# Patient Record
Sex: Male | Born: 1965 | Race: White | Hispanic: No | Marital: Married | Smoking: Never smoker
Health system: Southern US, Community
[De-identification: ages and names within clinical notes are randomized; demographics above are authoritative.]

## PROBLEM LIST (undated history)

## (undated) DIAGNOSIS — I10 Essential (primary) hypertension: Secondary | ICD-10-CM

## (undated) DIAGNOSIS — M1990 Unspecified osteoarthritis, unspecified site: Secondary | ICD-10-CM

## (undated) DIAGNOSIS — K5790 Diverticulosis of intestine, part unspecified, without perforation or abscess without bleeding: Secondary | ICD-10-CM

## (undated) DIAGNOSIS — K625 Hemorrhage of anus and rectum: Secondary | ICD-10-CM

## (undated) DIAGNOSIS — M459 Ankylosing spondylitis of unspecified sites in spine: Secondary | ICD-10-CM

## (undated) DIAGNOSIS — E669 Obesity, unspecified: Secondary | ICD-10-CM

## (undated) DIAGNOSIS — K529 Noninfective gastroenteritis and colitis, unspecified: Secondary | ICD-10-CM

## (undated) HISTORY — PX: COLON SURGERY: SHX602

## (undated) HISTORY — DX: Obesity, unspecified: E66.9

## (undated) HISTORY — PX: COLOSTOMY CLOSURE: SHX1381

## (undated) HISTORY — DX: Ankylosing spondylitis of unspecified sites in spine: M45.9

## (undated) HISTORY — PX: NECK SURGERY: SHX720

## (undated) HISTORY — DX: Hemorrhage of anus and rectum: K62.5

## (undated) HISTORY — PX: APPENDECTOMY: SHX54

## (undated) HISTORY — PX: TONSILLECTOMY: SUR1361

## (undated) HISTORY — DX: Unspecified osteoarthritis, unspecified site: M19.90

## (undated) HISTORY — DX: Noninfective gastroenteritis and colitis, unspecified: K52.9

## (undated) HISTORY — DX: Essential (primary) hypertension: I10

## (undated) HISTORY — PX: SPINE SURGERY: SHX786

## (undated) HISTORY — DX: Diverticulosis of intestine, part unspecified, without perforation or abscess without bleeding: K57.90

---

## 1999-06-28 ENCOUNTER — Encounter: Admission: RE | Admit: 1999-06-28 | Discharge: 1999-06-28 | Payer: Self-pay | Admitting: Orthopedic Surgery

## 1999-07-01 ENCOUNTER — Encounter: Admission: RE | Admit: 1999-07-01 | Discharge: 1999-07-01 | Payer: Self-pay | Admitting: Orthopedic Surgery

## 1999-07-01 ENCOUNTER — Encounter: Payer: Self-pay | Admitting: Orthopedic Surgery

## 2001-01-14 ENCOUNTER — Inpatient Hospital Stay (HOSPITAL_COMMUNITY): Admission: AD | Admit: 2001-01-14 | Discharge: 2001-01-17 | Payer: Self-pay | Admitting: Gastroenterology

## 2001-01-14 ENCOUNTER — Encounter (INDEPENDENT_AMBULATORY_CARE_PROVIDER_SITE_OTHER): Payer: Self-pay | Admitting: *Deleted

## 2002-04-10 ENCOUNTER — Encounter (INDEPENDENT_AMBULATORY_CARE_PROVIDER_SITE_OTHER): Payer: Self-pay | Admitting: Specialist

## 2002-04-10 ENCOUNTER — Inpatient Hospital Stay (HOSPITAL_COMMUNITY): Admission: EM | Admit: 2002-04-10 | Discharge: 2002-04-13 | Payer: Self-pay | Admitting: Emergency Medicine

## 2004-11-17 ENCOUNTER — Encounter: Admission: RE | Admit: 2004-11-17 | Discharge: 2005-02-15 | Payer: Self-pay | Admitting: *Deleted

## 2005-01-01 ENCOUNTER — Observation Stay (HOSPITAL_COMMUNITY): Admission: AD | Admit: 2005-01-01 | Discharge: 2005-01-02 | Payer: Self-pay | Admitting: Internal Medicine

## 2006-07-13 ENCOUNTER — Encounter: Admission: RE | Admit: 2006-07-13 | Discharge: 2006-07-13 | Payer: Self-pay | Admitting: Rheumatology

## 2006-08-23 ENCOUNTER — Ambulatory Visit (HOSPITAL_COMMUNITY): Admission: RE | Admit: 2006-08-23 | Discharge: 2006-08-23 | Payer: Self-pay | Admitting: Internal Medicine

## 2006-08-23 ENCOUNTER — Encounter (HOSPITAL_COMMUNITY): Admission: RE | Admit: 2006-08-23 | Discharge: 2006-11-04 | Payer: Self-pay | Admitting: Internal Medicine

## 2008-01-30 HISTORY — PX: NECK SURGERY: SHX720

## 2009-01-26 ENCOUNTER — Encounter: Admission: RE | Admit: 2009-01-26 | Discharge: 2009-01-26 | Payer: Self-pay | Admitting: Rheumatology

## 2009-01-30 ENCOUNTER — Encounter: Admission: RE | Admit: 2009-01-30 | Discharge: 2009-01-30 | Payer: Self-pay | Admitting: Rheumatology

## 2009-02-09 ENCOUNTER — Encounter: Admission: RE | Admit: 2009-02-09 | Discharge: 2009-02-09 | Payer: Self-pay | Admitting: Neurological Surgery

## 2009-03-25 ENCOUNTER — Inpatient Hospital Stay (HOSPITAL_COMMUNITY): Admission: RE | Admit: 2009-03-25 | Discharge: 2009-03-26 | Payer: Self-pay | Admitting: Neurological Surgery

## 2009-06-06 ENCOUNTER — Ambulatory Visit (HOSPITAL_COMMUNITY): Admission: RE | Admit: 2009-06-06 | Discharge: 2009-06-06 | Payer: Self-pay | Admitting: Internal Medicine

## 2010-02-01 ENCOUNTER — Encounter
Admission: RE | Admit: 2010-02-01 | Discharge: 2010-02-01 | Payer: Self-pay | Source: Home / Self Care | Attending: Surgery | Admitting: Surgery

## 2010-02-06 ENCOUNTER — Ambulatory Visit (HOSPITAL_COMMUNITY)
Admission: RE | Admit: 2010-02-06 | Discharge: 2010-02-06 | Payer: Self-pay | Source: Home / Self Care | Attending: Surgery | Admitting: Surgery

## 2010-02-13 LAB — PROTIME-INR
INR: 1.16 (ref 0.00–1.49)
Prothrombin Time: 15 seconds (ref 11.6–15.2)

## 2010-02-13 LAB — CBC
HCT: 30.8 % — ABNORMAL LOW (ref 39.0–52.0)
Hemoglobin: 9.3 g/dL — ABNORMAL LOW (ref 13.0–17.0)
MCH: 21.3 pg — ABNORMAL LOW (ref 26.0–34.0)
MCHC: 30.2 g/dL (ref 30.0–36.0)
MCV: 70.6 fL — ABNORMAL LOW (ref 78.0–100.0)
Platelets: 601 10*3/uL — ABNORMAL HIGH (ref 150–400)
RBC: 4.36 MIL/uL (ref 4.22–5.81)
RDW: 18.1 % — ABNORMAL HIGH (ref 11.5–15.5)
WBC: 11.3 10*3/uL — ABNORMAL HIGH (ref 4.0–10.5)

## 2010-02-13 LAB — CULTURE, ROUTINE-ABSCESS

## 2010-02-13 LAB — APTT: aPTT: 37 seconds (ref 24–37)

## 2010-02-20 ENCOUNTER — Ambulatory Visit (HOSPITAL_COMMUNITY)
Admission: RE | Admit: 2010-02-20 | Discharge: 2010-02-20 | Payer: Self-pay | Source: Home / Self Care | Attending: Surgery | Admitting: Surgery

## 2010-03-07 ENCOUNTER — Other Ambulatory Visit: Payer: Self-pay | Admitting: Gastroenterology

## 2010-03-13 ENCOUNTER — Encounter (HOSPITAL_COMMUNITY)
Admission: RE | Admit: 2010-03-13 | Discharge: 2010-03-13 | Disposition: A | Discharge status: Home / Self Care | Payer: 59 | Source: Ambulatory Visit | Attending: Surgery | Admitting: Surgery

## 2010-03-13 DIAGNOSIS — Z01810 Encounter for preprocedural cardiovascular examination: Secondary | ICD-10-CM | POA: Insufficient documentation

## 2010-03-13 DIAGNOSIS — Z01812 Encounter for preprocedural laboratory examination: Secondary | ICD-10-CM | POA: Insufficient documentation

## 2010-03-13 LAB — COMPREHENSIVE METABOLIC PANEL
ALT: 9 U/L (ref 0–53)
AST: 15 U/L (ref 0–37)
Albumin: 3.3 g/dL — ABNORMAL LOW (ref 3.5–5.2)
Alkaline Phosphatase: 181 U/L — ABNORMAL HIGH (ref 39–117)
BUN: 7 mg/dL (ref 6–23)
CO2: 26 mEq/L (ref 19–32)
Calcium: 10 mg/dL (ref 8.4–10.5)
Chloride: 98 mEq/L (ref 96–112)
Creatinine, Ser: 1.23 mg/dL (ref 0.4–1.5)
GFR calc Af Amer: >60 mL/min (ref >60)
GFR calc non Af Amer: >60 mL/min (ref >60)
Glucose, Bld: 103 mg/dL — ABNORMAL HIGH (ref 70–99)
Potassium: 5.4 mEq/L — ABNORMAL HIGH (ref 3.5–5.1)
Sodium: 134 mEq/L — ABNORMAL LOW (ref 135–145)
Total Bilirubin: 0.5 mg/dL (ref 0.3–1.2)
Total Protein: 7.6 g/dL (ref 6.0–8.3)

## 2010-03-13 LAB — DIFFERENTIAL
Basophils Absolute: 0 10*3/uL (ref 0.0–0.1)
Basophils Relative: 0 % (ref 0–1)
Eosinophils Absolute: 0.2 10*3/uL (ref 0.0–0.7)
Eosinophils Relative: 2 % (ref 0–5)
Lymphocytes Relative: 14 % (ref 12–46)
Lymphs Abs: 1.6 10*3/uL (ref 0.7–4.0)
Monocytes Absolute: 0.9 10*3/uL (ref 0.1–1.0)
Monocytes Relative: 8 % (ref 3–12)
Neutro Abs: 8.4 10*3/uL — ABNORMAL HIGH (ref 1.7–7.7)
Neutrophils Relative %: 76 % (ref 43–77)

## 2010-03-13 LAB — CBC
HCT: 30.5 % — ABNORMAL LOW (ref 39.0–52.0)
Hemoglobin: 9.5 g/dL — ABNORMAL LOW (ref 13.0–17.0)
MCH: 21.6 pg — ABNORMAL LOW (ref 26.0–34.0)
MCHC: 31.1 g/dL (ref 30.0–36.0)
MCV: 69.3 fL — ABNORMAL LOW (ref 78.0–100.0)
Platelets: 547 10*3/uL — ABNORMAL HIGH (ref 150–400)
RBC: 4.4 MIL/uL (ref 4.22–5.81)
RDW: 18.3 % — ABNORMAL HIGH (ref 11.5–15.5)
WBC: 11.1 10*3/uL — ABNORMAL HIGH (ref 4.0–10.5)

## 2010-03-13 LAB — SURGICAL PCR SCREEN
MRSA, PCR: NEGATIVE
Staphylococcus aureus: NEGATIVE

## 2010-03-15 ENCOUNTER — Other Ambulatory Visit: Payer: Self-pay | Admitting: Surgery

## 2010-03-15 ENCOUNTER — Inpatient Hospital Stay (HOSPITAL_COMMUNITY)
Admission: RE | Admit: 2010-03-15 | Discharge: 2010-03-22 | DRG: 329 | Disposition: A | Discharge status: Home / Self Care | Payer: 59 | Source: Ambulatory Visit | Attending: Surgery | Admitting: Surgery

## 2010-03-15 DIAGNOSIS — K5669 Other intestinal obstruction: Principal | ICD-10-CM | POA: Diagnosis present

## 2010-03-15 DIAGNOSIS — M109 Gout, unspecified: Secondary | ICD-10-CM | POA: Diagnosis present

## 2010-03-15 DIAGNOSIS — K651 Peritoneal abscess: Secondary | ICD-10-CM | POA: Diagnosis present

## 2010-03-15 DIAGNOSIS — N321 Vesicointestinal fistula: Secondary | ICD-10-CM | POA: Diagnosis present

## 2010-03-15 DIAGNOSIS — Z01812 Encounter for preprocedural laboratory examination: Secondary | ICD-10-CM

## 2010-03-15 DIAGNOSIS — K560 Paralytic ileus: Secondary | ICD-10-CM | POA: Diagnosis not present

## 2010-03-15 DIAGNOSIS — Z01810 Encounter for preprocedural cardiovascular examination: Secondary | ICD-10-CM

## 2010-03-15 DIAGNOSIS — I10 Essential (primary) hypertension: Secondary | ICD-10-CM | POA: Diagnosis present

## 2010-03-15 LAB — BASIC METABOLIC PANEL
BUN: 6 mg/dL (ref 6–23)
CO2: 22 mEq/L (ref 19–32)
Calcium: 8.4 mg/dL (ref 8.4–10.5)
Chloride: 100 mEq/L (ref 96–112)
Creatinine, Ser: 1.13 mg/dL (ref 0.4–1.5)
GFR calc Af Amer: >60 mL/min (ref >60)
GFR calc non Af Amer: >60 mL/min (ref >60)
Glucose, Bld: 231 mg/dL — ABNORMAL HIGH (ref 70–99)
Potassium: 4.7 mEq/L (ref 3.5–5.1)
Sodium: 131 mEq/L — ABNORMAL LOW (ref 135–145)

## 2010-03-15 LAB — CBC
HCT: 28.3 % — ABNORMAL LOW (ref 39.0–52.0)
Hemoglobin: 8.6 g/dL — ABNORMAL LOW (ref 13.0–17.0)
MCH: 21.3 pg — ABNORMAL LOW (ref 26.0–34.0)
MCHC: 30.4 g/dL (ref 30.0–36.0)
MCV: 70 fL — ABNORMAL LOW (ref 78.0–100.0)
Platelets: 534 10*3/uL — ABNORMAL HIGH (ref 150–400)
RBC: 4.04 MIL/uL — ABNORMAL LOW (ref 4.22–5.81)
RDW: 18.6 % — ABNORMAL HIGH (ref 11.5–15.5)
WBC: 20.3 10*3/uL — ABNORMAL HIGH (ref 4.0–10.5)

## 2010-03-16 LAB — COMPREHENSIVE METABOLIC PANEL
ALT: 9 U/L (ref 0–53)
AST: 14 U/L (ref 0–37)
Albumin: 2.3 g/dL — ABNORMAL LOW (ref 3.5–5.2)
Alkaline Phosphatase: 103 U/L (ref 39–117)
BUN: 6 mg/dL (ref 6–23)
CO2: 25 mEq/L (ref 19–32)
Calcium: 8.4 mg/dL (ref 8.4–10.5)
Chloride: 101 mEq/L (ref 96–112)
Creatinine, Ser: 0.96 mg/dL (ref 0.4–1.5)
GFR calc Af Amer: >60 mL/min (ref >60)
GFR calc non Af Amer: >60 mL/min (ref >60)
Glucose, Bld: 163 mg/dL — ABNORMAL HIGH (ref 70–99)
Potassium: 4.5 mEq/L (ref 3.5–5.1)
Sodium: 132 mEq/L — ABNORMAL LOW (ref 135–145)
Total Bilirubin: 0.6 mg/dL (ref 0.3–1.2)
Total Protein: 5.9 g/dL — ABNORMAL LOW (ref 6.0–8.3)

## 2010-03-16 LAB — CBC
HCT: 26.3 % — ABNORMAL LOW (ref 39.0–52.0)
Hemoglobin: 8 g/dL — ABNORMAL LOW (ref 13.0–17.0)
MCH: 21.2 pg — ABNORMAL LOW (ref 26.0–34.0)
MCHC: 30.4 g/dL (ref 30.0–36.0)
MCV: 69.6 fL — ABNORMAL LOW (ref 78.0–100.0)
Platelets: 555 10*3/uL — ABNORMAL HIGH (ref 150–400)
RBC: 3.78 MIL/uL — ABNORMAL LOW (ref 4.22–5.81)
RDW: 18.6 % — ABNORMAL HIGH (ref 11.5–15.5)
WBC: 15.1 10*3/uL — ABNORMAL HIGH (ref 4.0–10.5)

## 2010-03-17 LAB — COMPREHENSIVE METABOLIC PANEL
ALT: 9 U/L (ref 0–53)
AST: 14 U/L (ref 0–37)
Albumin: 2.4 g/dL — ABNORMAL LOW (ref 3.5–5.2)
Alkaline Phosphatase: 91 U/L (ref 39–117)
BUN: 6 mg/dL (ref 6–23)
CO2: 28 mEq/L (ref 19–32)
Calcium: 8.7 mg/dL (ref 8.4–10.5)
Chloride: 104 mEq/L (ref 96–112)
Creatinine, Ser: 0.96 mg/dL (ref 0.4–1.5)
GFR calc Af Amer: >60 mL/min (ref >60)
GFR calc non Af Amer: >60 mL/min (ref >60)
Glucose, Bld: 115 mg/dL — ABNORMAL HIGH (ref 70–99)
Potassium: 4.3 mEq/L (ref 3.5–5.1)
Sodium: 138 mEq/L (ref 135–145)
Total Bilirubin: 0.5 mg/dL (ref 0.3–1.2)
Total Protein: 6.2 g/dL (ref 6.0–8.3)

## 2010-03-17 LAB — CBC
HCT: 25 % — ABNORMAL LOW (ref 39.0–52.0)
Hemoglobin: 7.4 g/dL — ABNORMAL LOW (ref 13.0–17.0)
MCH: 21.1 pg — ABNORMAL LOW (ref 26.0–34.0)
MCHC: 29.6 g/dL — ABNORMAL LOW (ref 30.0–36.0)
MCV: 71.2 fL — ABNORMAL LOW (ref 78.0–100.0)
Platelets: 561 10*3/uL — ABNORMAL HIGH (ref 150–400)
RBC: 3.51 MIL/uL — ABNORMAL LOW (ref 4.22–5.81)
RDW: 19 % — ABNORMAL HIGH (ref 11.5–15.5)
WBC: 10.8 10*3/uL — ABNORMAL HIGH (ref 4.0–10.5)

## 2010-03-17 NOTE — Op Note (Signed)
NAMECESARE, SUMLIN                 ACCOUNT NO.:  [id number]  MEDICAL RECORD NO.:  [id number]           PATIENT TYPE:  I  LOCATION:  6759                         FACILITY:  [facility]  PHYSICIAN:  Joyice Faster. 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  POSTPROCEDURE DIAGNOSES: 1. Sigmoid colon stricture with pelvic abscess. 2. Questionable fistula to dome of bladder. 3. Large pelvic abscess.  PROCEDURE: 1. Sigmoid colectomy with end colostomy. 2. Drainage of pelvic abscess. 3. Incidental appendectomy. 4. Mobilization of splenic flexure  SURGEON:  Marcello Moores A. Arvis Miguez, MD  ASSISTANT:  Andrey Spearman, MD  ANESTHESIA:  General endotracheal anesthesia.  ESTIMATED BLOOD LOSS:  100 mL.  DRAIN:  A 19 round Blake drain to pelvis.  SPECIMEN:  Sigmoid colon and abscess cavity biopsies to pathology. Also, segment of descending colon to pathology.  INDICATIONS FOR PROCEDURE:  The patient is a 45 year old male whom I saw approximately 6 weeks ago.  He was sent to my office at the request of Dr. Rana Snare due to recurrent urinary tract infections.  A CT scan done in early December, which showed a pelvic abscess.  He came to my office at the end of December for evaluation.  His history of ulcerative colitis and there is some question if it was idiopathic colitis.  He was never cleared to me if he had ulcerative colitis or true Crohn colitis, but on CT scan he had an abscess, which appeared to be from the sigmoid colon to the dome of the bladder and had symptoms consistent with a colovesical fistula with some air in the urine as well as some sediment in the urine.  He was worked up with the cystic with cystoscopy, which did not show any obvious problem.  I sent him for a  flexible sigmoidoscopy by Dr. Laurence Spates.  Biopsies just showed nonspecific colitis and a stricture at the sigmoid colon, at which point the scope would not passed.  He presents today for sigmoid colectomy due to obstruction and also due to pelvic abscess.  He had a percutaneous drainage tube put about 4 weeks ago, which helped a lot of symptoms.  He is having dysuria, but his pain is worsened and he requires surgical intervention.  We discussed the fact that it was not totally clear to me what type of colitis or inflammatory bowel disease he had and the sigmoid colectomy with colostomy would be the best thing for now since he had a large pelvic abscess, we have been trying to clear.  He understood the potential risk of bleeding, infection, colostomy issues, wound problems, colostomy prolapse, injury to the small intestines, small bowel resection.  After understanding the above, he agreed to proceed.  Also, the risks were bleeding, pelvic sepsis, wound dehiscence, death, cardiovascular arrest, DVT and the need for further surgery.  DESCRIPTION OF PROCEDURE:  After he was seen in the holding area.  He was brought back from  the holding area to the operating room and general anesthesia was initiated with head in supine position.  Abdomen was then prepped and draped in sterile fashion.  The percutaneous drain he had in place was removed prior to doing this.  Foley catheter was placed without difficult.  The urine appeared relatively clear.  A lower midline incision was made.  Dissection was carried to the fascia.  Once I entered his abdominal cavity, a large phlegmon involving the sigmoid colon in the left lower quadrant was extended anteriorly to the anterior abdominal wall and over the dome of the bladder.  I used my finger to fracture this down and then put a retractor in it.  We put the patient head down.  The sigmoid colon was densely adherent to the left pelvic sidewall and anterior  to the dome of the bladder.  I then mobilized the descending colon along the white line of Toldt down.  There are changes to his colon consistent with what to me  looked like Crohn disease but again this may have just been secondary to the inflammatory process.  It was a very dense and it was actually an  abscess cavity that we basically opened into. This was separated from the bladder dome .  We took biopsies of this area and the frozen section just appeared to be inflammatory change.  There was a stricture of the sigmoid colon involving this area as well.  The distal sigmoid and proximal rectum looked grossly normal.  The descending colon had some cobblestone changes to it, but the mesentery was not thickened nor I did not see  any  creeping fat associated with it. Transverse colon looked  normal and the ascending colon looked  normal.  Small bowel was run and it was grossly all normal. Stomach appeared normal as well as the liver.  Once, we mobilized the colon up by divided it  proximately with a  GIA-75 stapling device. Mesentery was taken down with the Ensure  device and individual sutures of 3- 0 Vicryl as needed.  Left Ureter was identified  below his pelvic phlegmon and  we stayed well away from that.  It did not appeared to be obstructed.  We examined the abscess cavity carefully.  There was no obvious communication to the ureter or bladder.  We gave him 10 mL of methylene blue intravenously and clamped his Foley to see if we could demonstrating the leakage from the bladder region.  We  could not demonstrate the leakage, but the bladder is a little bit harder and not easily  distended due to the inflammatory changes around it.  There was no obvious hole and  I could not feel the Foley easily and I did not see the Foley in it.  There was no obvious urine leaking from the operative field.  Left ureter was identified and it was easily seen now with methylene blue with no signs of  leakageand it did not appear massively dilated or obstructed.  At this point in time, I placed two 2-0 Prolene sutures in the distal stump.  We had to mobilizeand  take down the splenic flexure in order to mobilize the left colon to create a colostomy.   Once, we did this, the proximal descending colon had an  area of thickness as well.  We then went ahead and took a GIA-75 stapling device and amputated about 2 to 3 cm of this off.  It was somewhat thickened.  This was sent  to pathology for further evaluation. The appendix was down in  the pelvis.  I mobilized this up.  I felt that appendectomy would be warranted here when we did that by taking the mesoappendix down with the Ensure.  We ligated the appendix with 2-0 Vicryl and we dunked the stump with a pursestring suture of 3-0 Vicryl. Appendix was sent to pathology.  The pelvis was irrigated with about 3 liters of saline.  Through the right lower quadrant stab incision, 19 round Blake drain was placed down the pelvis in the abscess cavity just over the dome of the bladder.  We irrigated out the abdominal cavity. Removed all sponges.  Sponge count was correct.  The splenic flexure was examined.  I saw no evidence of any bleeding or oozing from this area and the spleen appeared to be uninjured.  After irrigation was suctioned out, we went ahead and closed  the fascia with a running double-stranded PDS suture.  Given the amount of contamination, I reapproximated the skin with a few skin staples and packed dressed wound open.  Prior to this, we created a left upper quadrant colostomy.  A circular incision was made in the left upper quadrant.  Dissection was carried down the fascia and cruciate incision was made in the anterior part of the rectus sheath.  Posterior sheath was also opened with cautery and muscles were split.  We then pulled the colon out through this.  We then matured the colostomy after the abdominal wall was closed and the  skin was loosely approximated.  We matured the left upper quadrant colostomy with interrupted 3-0 Monocryl.  A wet to   Dry dressing was then applied.  Drain was placed to bulb suction.  All final counts of sponge, needle and instruments were found to be correct x2.  The patient was extubated, taken to recovery in satisfactory condition.     Barnaby Rippeon A. Bradlee Bridgers, M.D.     TAC/MEDQ  D:  03/15/2010  T:  03/16/2010  Job:  [id number]  cc:   Jeneen Rinks L. Rolla Flatten., M.D. Bernestine Amass, M.D.  Electronically Signed by Erroll Luna M.D. on 03/17/2010 06:55:01 PM

## 2010-03-18 ENCOUNTER — Inpatient Hospital Stay (HOSPITAL_COMMUNITY): Payer: 59

## 2010-03-18 LAB — BASIC METABOLIC PANEL
BUN: 3 mg/dL — ABNORMAL LOW (ref 6–23)
CO2: 30 mEq/L (ref 19–32)
Calcium: 8.7 mg/dL (ref 8.4–10.5)
Chloride: 102 mEq/L (ref 96–112)
Creatinine, Ser: 0.94 mg/dL (ref 0.4–1.5)
GFR calc Af Amer: >60 mL/min (ref >60)
GFR calc non Af Amer: >60 mL/min (ref >60)
Glucose, Bld: 108 mg/dL — ABNORMAL HIGH (ref 70–99)
Potassium: 4.2 mEq/L (ref 3.5–5.1)
Sodium: 138 mEq/L (ref 135–145)

## 2010-03-18 LAB — CBC
HCT: 24.8 % — ABNORMAL LOW (ref 39.0–52.0)
Hemoglobin: 7.3 g/dL — ABNORMAL LOW (ref 13.0–17.0)
MCH: 21 pg — ABNORMAL LOW (ref 26.0–34.0)
MCHC: 29.4 g/dL — ABNORMAL LOW (ref 30.0–36.0)
MCV: 71.5 fL — ABNORMAL LOW (ref 78.0–100.0)
Platelets: 521 10*3/uL — ABNORMAL HIGH (ref 150–400)
RBC: 3.47 MIL/uL — ABNORMAL LOW (ref 4.22–5.81)
RDW: 19 % — ABNORMAL HIGH (ref 11.5–15.5)
WBC: 7.2 10*3/uL (ref 4.0–10.5)

## 2010-03-18 LAB — CULTURE, ROUTINE-ABSCESS

## 2010-03-19 LAB — TYPE AND SCREEN
ABO/RH(D): A POS
Antibody Screen: POSITIVE
DAT, IgG: POSITIVE
Donor AG Type: NEGATIVE
Donor AG Type: NEGATIVE
Unit division: 0
Unit division: 0

## 2010-03-19 LAB — CBC
HCT: 24.6 % — ABNORMAL LOW (ref 39.0–52.0)
Hemoglobin: 7.3 g/dL — ABNORMAL LOW (ref 13.0–17.0)
MCH: 20.9 pg — ABNORMAL LOW (ref 26.0–34.0)
MCHC: 29.7 g/dL — ABNORMAL LOW (ref 30.0–36.0)
MCV: 70.5 fL — ABNORMAL LOW (ref 78.0–100.0)
Platelets: 451 10*3/uL — ABNORMAL HIGH (ref 150–400)
RBC: 3.49 MIL/uL — ABNORMAL LOW (ref 4.22–5.81)
RDW: 18.5 % — ABNORMAL HIGH (ref 11.5–15.5)
WBC: 5.9 10*3/uL (ref 4.0–10.5)

## 2010-03-19 LAB — COMPREHENSIVE METABOLIC PANEL
ALT: 17 U/L (ref 0–53)
AST: 23 U/L (ref 0–37)
Albumin: 2.2 g/dL — ABNORMAL LOW (ref 3.5–5.2)
Alkaline Phosphatase: 193 U/L — ABNORMAL HIGH (ref 39–117)
BUN: 3 mg/dL — ABNORMAL LOW (ref 6–23)
CO2: 31 mEq/L (ref 19–32)
Calcium: 8.8 mg/dL (ref 8.4–10.5)
Chloride: 103 mEq/L (ref 96–112)
Creatinine, Ser: 0.93 mg/dL (ref 0.4–1.5)
GFR calc Af Amer: >60 mL/min (ref >60)
GFR calc non Af Amer: >60 mL/min (ref >60)
Glucose, Bld: 106 mg/dL — ABNORMAL HIGH (ref 70–99)
Potassium: 4.1 mEq/L (ref 3.5–5.1)
Sodium: 139 mEq/L (ref 135–145)
Total Bilirubin: 1.1 mg/dL (ref 0.3–1.2)
Total Protein: 6.5 g/dL (ref 6.0–8.3)

## 2010-03-20 ENCOUNTER — Inpatient Hospital Stay (HOSPITAL_COMMUNITY): Payer: 59

## 2010-03-20 LAB — ANAEROBIC CULTURE

## 2010-03-28 NOTE — Discharge Summary (Signed)
  NAMEDELMOS, VELAQUEZ                 ACCOUNT NO.:  [id number]  MEDICAL RECORD NO.:  [id number]           PATIENT TYPE:  I  LOCATION:  0761                         FACILITY:  [facility]  PHYSICIAN:  Joyice Faster. Embry Manrique, M.D.DATE OF BIRTH:  [birth year]  DATE OF ADMISSION:  03/15/2010 DATE OF DISCHARGE:  03/22/2010                              DISCHARGE SUMMARY   ADMITTING DIAGNOSIS:  Pelvic abscess with sigmoid colon stricture, possible diverticulitis.  POSTOPERATIVE DIAGNOSES: 1. Pelvic abscess with sigmoid colon stricture, possible     diverticulitis. 2. History of ulcerative colitis.  PROCEDURES:  Sigmoid colectomy with end colostomy.              Mobilization of splenic flexure  BRIEF HISTORY:  The patient is a 45 year old male admitted on March 15, 2010, for sigmoid colectomy secondary to a colon stricture and pelvic abscess.  He also had pneumaturia and concerns for a colovesical fistula on workup.  He has history of ulcerative colitis with possibility this could be Crohn's.  He had extensive workup.  Past workup revealed a pelvic abscess which has been there for a couple of months and had been drained before.  We felt that surgical resection at this point was warranted.  HOSPITAL COURSE:  The patient underwent sigmoid colectomy with end colostomy, with placement of drain in the pelvis and incidental appendectomy on March 15, 2010.  Hospital course complicated by ileus.  It was on the indirect protocol.  He after about 4 days began to have colostomy output.  His colostomy was viable.  Wound was packed open because of the amount of contamination intra-abdominally.  His dressing was changed.  A JP drain was placed near his bladder and he had no leakage from the bladder.  On postop day #5, he had a cystogram which showed the bladder to be of normal consistency without leakage and Foley was removed.  He is ambulating, tolerating a soft diet, had no fever or chills, was doing  well on p.o. pain medication.  He was discharged home on postop day #8 in satisfactory condition.  DISCHARGE INSTRUCTIONS:  Home Health has been arranged for wound care and JP drainage care and ostomy care.  He will continue soft diet and his home meds as outlined by the medical reconciliation list, which I have reviewed and signed off this morning.  It is in the chart.  CONDITION ON DISCHARGE:  Improved.     Lindel Marcell A. Jeniah Kishi, M.D.     TAC/MEDQ  D:  03/22/2010  T:  03/22/2010  Job:  [id number]  cc:   Jeneen Rinks L. Rolla Flatten., M.D. Bernestine Amass, M.D.  Electronically Signed by Erroll Luna M.D. on 03/28/2010 08:46:32 AM

## 2010-04-19 LAB — CBC
HCT: 37.7 % — ABNORMAL LOW (ref 39.0–52.0)
Hemoglobin: 12.9 g/dL — ABNORMAL LOW (ref 13.0–17.0)
MCHC: 34.1 g/dL (ref 30.0–36.0)
MCV: 89.8 fL (ref 78.0–100.0)
Platelets: 466 10*3/uL — ABNORMAL HIGH (ref 150–400)
RBC: 4.2 MIL/uL — ABNORMAL LOW (ref 4.22–5.81)
RDW: 14.4 % (ref 11.5–15.5)
WBC: 7.8 10*3/uL (ref 4.0–10.5)

## 2010-04-19 LAB — DIFFERENTIAL
Basophils Absolute: 0.2 10*3/uL — ABNORMAL HIGH (ref 0.0–0.1)
Basophils Relative: 3 % — ABNORMAL HIGH (ref 0–1)
Eosinophils Absolute: 0.2 10*3/uL (ref 0.0–0.7)
Eosinophils Relative: 3 % (ref 0–5)
Lymphocytes Relative: 23 % (ref 12–46)
Lymphs Abs: 1.8 10*3/uL (ref 0.7–4.0)
Monocytes Absolute: 0.6 10*3/uL (ref 0.1–1.0)
Monocytes Relative: 8 % (ref 3–12)
Neutro Abs: 5 10*3/uL (ref 1.7–7.7)
Neutrophils Relative %: 64 % (ref 43–77)

## 2010-04-19 LAB — TYPE AND SCREEN
ABO/RH(D): A POS
Antibody Screen: POSITIVE
DAT, IgG: NEGATIVE
Donor AG Type: NEGATIVE
Donor AG Type: NEGATIVE
PT AG Type: NEGATIVE

## 2010-04-19 LAB — SURGICAL PCR SCREEN
MRSA, PCR: NEGATIVE
Staphylococcus aureus: POSITIVE — AB

## 2010-04-19 LAB — BASIC METABOLIC PANEL
BUN: 11 mg/dL (ref 6–23)
CO2: 28 mEq/L (ref 19–32)
Calcium: 9.3 mg/dL (ref 8.4–10.5)
Chloride: 96 mEq/L (ref 96–112)
Creatinine, Ser: 1.11 mg/dL (ref 0.4–1.5)
GFR calc Af Amer: >60 mL/min (ref >60)
GFR calc non Af Amer: >60 mL/min (ref >60)
Glucose, Bld: 97 mg/dL (ref 70–99)
Potassium: 4.8 mEq/L (ref 3.5–5.1)
Sodium: 134 mEq/L — ABNORMAL LOW (ref 135–145)

## 2010-04-19 LAB — APTT: aPTT: 30 seconds (ref 24–37)

## 2010-04-19 LAB — PROTIME-INR
INR: 0.97 (ref 0.00–1.49)
Prothrombin Time: 12.8 seconds (ref 11.6–15.2)

## 2010-06-16 NOTE — Consult Note (Signed)
NAME:  Austin Chang, Austin Chang NO.:  [id number]   MEDICAL RECORD NO.:  [id number]                   PATIENT TYPE:  INP   LOCATION:  1823                                 FACILITY:  [facility]   PHYSICIAN:  Ronald Lobo, M.D.                DATE OF BIRTH:  Mar 29, 1965   DATE OF CONSULTATION:  04/10/2002  DATE OF DISCHARGE:                                   CONSULTATION   REASON FOR CONSULTATION:  Dr. 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   HISTORY OF PRESENT ILLNESS:  The patient is known to my partner, Dr. Leonie Douglas, from hospitalization for new-onset colitis in December 2002, at  which time colonoscopy showed a normal terminal ileum, and there was  extensive colitis.  Proctitis was present at that time.  He was treated with  Asacol until this past summer, at which time it was discontinued, and the  patient has been maintained on low doses of prednisone for arthritis, also  recent ibuprofen.   He has had some mild dyspeptic symptoms and has maintained his usual  baseline bowel habit of a couple of bowel movements a day without visible  blood.  This morning, however, he had gross hematochezia on a couple of  occasions and then had syncope and presented to the emergency room where he  apparently again became syncopal.   An admission hemoglobin was 6.6 although, interestingly, the patient is  profoundly microcytic suggesting an acute-on-chronic problem with blood  loss.   The patient denies any significant abdominal pain at this time.   ALLERGIES:  SEPTRA (severe headache).   OUTPATIENT MEDICATIONS:  1. Prednisone, I believe 5 mg daily.  2. Ibuprofen.   OPERATION:  Tonsillectomy.   CHRONIC MEDICAL ILLNESSES:  1. Borderline hypertension.  2. Colitis.  3. Arthritis (the patient is not clear on the type).  4. As far as I am aware, there is no history of cardiopulmonary disease or   diabetes.   HABITS:  Nonsmoker.  Moderately heavy ethanol.   FAMILY HISTORY:  Heart disease in both parents.   SOCIAL HISTORY:  The patient is going through a divorce at this time.  He  has a girlfriend.  He is originally from [place].  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   REVIEW OF SYSTEMS:  The patient has lost about 10-15 pounds in weight since  he started up his own company, but he attributes this to the excessive  stress.  No significant dyspeptic symptoms.  No visible bleeding until  today.  He averages a couple of bowel movements a day which I believe are  soft in character but not frankly diarrheal.   PHYSICAL EXAMINATION:  GENERAL:  This is a  stout, healthy-appearing white  male in no evident distress.  HEENT:  __________ depressed, anicteric.  No frank pallor (has received one  unit of packed cells so far).  CHEST:  Clear.  HEART:  Shows a rapid rate, but no murmurs or gallops are appreciated.  ABDOMEN:  Somewhat obese but without overt guarding, mass, or tenderness.   LABORATORY DATA:  Hemoglobin 6.6, MCV 57, RDW 19, platelets 402,000 which is  minimally elevated, white count 81 polys, 10 lymphs, 1 eosinophil.  Chemistry panel pertinent for BUN 9, creatinine 0.9, potassium 3.9.  Liver  chemistries completely within normal limits. Albumin 2.5 prior to hydration.   IMPRESSION:  1. Acute-on-chronic anemia with evidence of underlying iron deficiency.  2. Hematochezia with syncope.  3. History of colitis, presumed ulcerative colitis.  4. History of recent arthritis.   PLAN:  I think the overall picture is confusing.  The abruptness of his  bleeding and the associated syncope would bring to mind an upper tract  source of bleeding, especially given his exposure to ibuprofen and  prednisone.  Moreover, he has not been on any PPI prophylaxis.  On the other  hand, his  BUN is normal, and the stool is reportedly non melenic.   To sort things out, I think the patient should undergo endoscopy and  flexible sigmoidoscopy, the nature, purpose, and risks of which have been  reviewed with the patient, and he is agreeable to proceed.  Further  management would depend on those findings.                                               Ronald Lobo, M.D.    RB/MEDQ  D:  04/10/2002  T:  04/10/2002  Job:  [id number]   cc:   Dwaine Deter, M.D.  [address]  [place] [address]  Fax: [place] Rolla Flatten., M.D.  [address] [place]  [place]  [place] [address]  Fax: [phone]   Bo Merino, M.D.  [address]  [place], [place] [address]  Fax: [phone]

## 2010-06-16 NOTE — Consult Note (Signed)
Austin Chang, Austin Chang                 ACCOUNT NO.:  [id number]   MEDICAL RECORD NO.:  [id number]          PATIENT TYPE:  INP   LOCATION:  5729                         FACILITY:  [facility]   PHYSICIAN:  John C. Amedeo Plenty, M.D.    DATE OF BIRTH:  Dec 13, 1965   DATE OF CONSULTATION:  01/01/2005  DATE OF DISCHARGE:                                   CONSULTATION   GASTROENTEROLOGY CONSULTATION   HISTORY OF PRESENT ILLNESS:  The patient is a 45 year old white male with  history of ulcerative colitis who presented with rectal bleeding beginning  this morning on three occasions with mild diarrhea and minimal cramping.  He  has a history of ulcerative colitis since at least 2004.  He has been  maintained on Asacol and has currently been off prednisone or other  ancillary medicines for approximately one year.  He was admitted for  inpatient management of his ulcerative colitis.   PAST MEDICAL HISTORY:  1.  Ulcerative colitis.  2.  Degenerative joint disease.  3.  Hypertension.  4.  Gout.   MEDICATIONS:  Asacol, Allopurinol, iron, Diovan/hydrochlorothiazide.   PAST SURGICAL HISTORY:  Tonsillectomy.   ALLERGIES:  SULFA.   FAMILY HISTORY:  Noncontributory.   PHYSICAL EXAMINATION:  GENERAL APPEARANCE:  Well-developed, well-nourished  white male in no acute distress.  HEART:  Regular rate and rhythm without murmurs.  LUNGS:  Clear.  ABDOMEN:  Soft nondistended with normoactive bowel sounds.  No  hepatosplenomegaly, masses or guarding.   LABORATORY DATA:  Hemoglobin 8.8, hematocrit 27, MCV 64.9, white blood cell  count 12,000.  Sodium 117, CMET otherwise normal.   IMPRESSION:  1.  Flare of ulcerative colitis.  2.  Hyponatremia.   PLAN:  Patient is admitted for Solu-Medrol and intravenous hydration for  correction of his hyponatremia.  Continue Asacol and imagine he will be able  to be switched to oral prednisone in the near future. Will follow with you.     ______________________________  Elyse Jarvis. Amedeo Plenty, M.D.     JCH/MEDQ  D:  01/01/2005  T:  01/02/2005  Job:  [id number]   cc:   Jeneen Rinks L. Rolla Flatten., M.D.  Fax: [phone]

## 2010-06-16 NOTE — Discharge Summary (Signed)
NAMEMALACHY, Austin NO.:  [id number]   MEDICAL RECORD NO.:  [id number]          PATIENT TYPE:  OBV   LOCATION:  5729                         FACILITY:  [facility]   PHYSICIAN:  Dwaine Deter, M.D.DATE OF BIRTH:  1965-02-05   DATE OF ADMISSION:  01/01/2005  DATE OF DISCHARGE:  01/02/2005                                 DISCHARGE SUMMARY   DISCHARGE DIAGNOSES:  1.  Gastrointestinal bleed, resolved.  2.  History of ulcerative colitis, likely the cause of #1.  3.  Degenerative disc disease of the neck.  4.  Hypertension.   CONSULTATIONS:  John C. Amedeo Chang, M.D., gastroenterology.   DISCHARGE MEDICATIONS:  1.  Prednisone taper, 50 mg tapered down to 10 mg over 15 days, taper by 10      mg every three days.  2.  Asacol 400 mg two p.o. b.i.d.  3.  Protonix 40 mg daily.  4.  Iron tablet, 325 mg t.i.d.   HOSPITAL COURSE:  Austin Chang is a very pleasant 45 year old male with past  medical history of ulcerative colitis, gout and hypertension.  The patient  developed bloody stools for 24 hours and dizziness and was feeling light  headed when he would sit up.  He came to [facility] on  January 01, 2005 and was found to be orthostatic with blood pressures  dropping from 100/50 with pulse of 98 to 86/50 with pulse of 110 with  sitting up. He appeared pale.   He was admitted to [facility] and started on intravenous  prednisolone 16 mg q.8h. and had no further bleeding after the evening of  January 01, 2005.   Hemoglobin on admission was 8.8 but with intravenous hydration dropped to  8.3 on the following morning, but again with no further bleeding noted.  Vital signs were stable at discharge with pulse approximately 80 and regular  and patient was normotensive.  Diovan/HCT had been held while admitted and  actually on discharge he was to continue to hold the Diovan/HCT until  further notified.   LABORATORY DATA:  Laboratory studies obtained  while admitted included white  count 12,000 on admission and 9,600 on the next A.M.  Hemoglobin 8.8 on  admission and 8.3 on discharge.  Platelet count 780,000 on admission.  The  patient was also noted to be hyponatremic on admission with sodium of 117.  This was felt to likely be secondary to his hydrochlorothiazide (Diovan/HCT)  and again the Diovan/HCT was not continued at discharge.  Sodium was 122  later in the day with hydration with normal saline and on the morning of  discharge was 128 with chloride of 94.  BUN on discharge was 4 with  creatinine of 0.8 and calcium 9.1.  Iron levels were less than 10 with  normal being 42 to 135.   The patient was discharged home with the above-noted medications.  The  patient's Diovan/HCT was to be held and he was to be followed up in my  office on January 05, 2005.      Dwaine Deter, M.D.  Electronically Signed     RNG/MEDQ  D:  01/08/2005  T:  01/09/2005  Job:  [id number]   cc:   Austin Chang, M.D.  Fax: [phone]   [place] Rolla Flatten., M.D.  Fax: [phone]

## 2010-06-16 NOTE — H&P (Signed)
Austin Chang, Austin Chang NO.:  [id number]   MEDICAL RECORD NO.:  [id number]          PATIENT TYPE:  INP   LOCATION:  5729                         FACILITY:  [facility]   PHYSICIAN:  Sharlet Salina, M.D.   DATE OF BIRTH:  08-24-1965   DATE OF ADMISSION:  01/01/2005  DATE OF DISCHARGE:  01/02/2005                                HISTORY & PHYSICAL   CHIEF COMPLAINT:  Dizziness, bloody stools.   HISTORY OF PRESENT ILLNESS:  The patient is a 45 year old white male, past  medical history significant for ulcerative colitis, GI bleed, and gout.  The  patient presented today with bloody stools for one day and extreme  dizziness.  When he stands up, he feels as if he is going to pass out.  He  is really wobbly on his feet.  He is not able to quantify how much blood was  in the commode.  He has had no fevers, no chills, no abdominal pain.   PAST MEDICAL HISTORY:  1.  Gout.  2.  Ulcerative colitis, diagnosed in 2002.  3.  History of GI bleed.  4.  DJD in the neck.  5.  Hypertension.   FAMILY HISTORY:  Noncontributory.   SOCIAL HISTORY:  Positive for tobacco.  He drinks 5-6 beers per day.  He is  single.  He has a fiance.  He works as an Clinical biochemist.   MEDICATIONS:  1.  Asacol 400 mg, two b.i.d.  2.  Allopurinol b.i.d.  3.  Iron 325 mg every day.  4.  Diovan HCTZ 160/12.5 mg every day.   ALLERGIES:  1.  SULFA.  2.  CELEXA.  3.  Duanne Moron.   REVIEW OF SYSTEMS:  As stated in the HPI.   PHYSICAL EXAMINATION:  VITAL SIGNS:  Temperature is 98.2, blood pressure  100/50, pulse of 98 when he lays down, on standing blood pressure 86/50 with  a pulse of 110.  HEENT:  Head is normocephalic, atraumatic.  Pupils are equal, round, and  reactive to light.  Throat without erythema.  CARDIOVASCULAR:  Tachycardic.  LUNGS:  Clear bilaterally.  ABDOMEN:  Soft nontender.  Positive bowel sounds.  EXTREMITIES:  Without edema.   LABORATORY:  Pending.   ASSESSMENT/PLAN:  1.   Hypertension/dehydration.  We will admit, intravenous fluids at 150      cc/hr for 3 liters, then 125 cc/hr and monitor hemoglobin.  2.  Ulcerative colitis.  I gave him intravenous methylprednisolone 16 mg      intravenously every eight hours and Protonix 40 mg every day.  3.  Anemia.  Question of gastrointestinal bleed.  We will get CBC.  The      patient is admitted to observation.      Sharlet Salina, M.D.  Electronically Signed     NJ/MEDQ  D:  01/02/2005  T:  01/02/2005  Job:  [id number]   cc:   Dwaine Deter, M.D.  Fax: [phone]

## 2010-06-16 NOTE — Discharge Summary (Signed)
[place]. [facility]  Patient:    Austin Chang, Austin Chang Visit Number: [id number] MRN: [id number]          Service Type: MED Location: [id number] Attending Physician:  Sherrin Daisy Dictated by:   Joyice Faster. Oletta Lamas, M.D. Admit Date:  01/14/2001 Discharge Date: 01/17/2001   CC:         Ledora Bottcher, M.D.  Henrine Screws, M.D.   Discharge Summary  ADMISSION DIAGNOSIS:  Abdominal pain, anemia, and bloody diarrhea.  FINAL DIAGNOSIS:  Ulcerative colitis.  PROCEDURE:  Colonoscopy and biopsy.  HISTORY OF PRESENT ILLNESS:  The patient is a 45 year old who has been well until around September, who has had foot pain and been on a lot of nonsteroidals for this.  Had loose stools dating back to September, that has gotten progressively worse.  His stools have become more frequent with 20+ bowel movements a day, nocturnal stools have become bloody for a week or two prior to this admission.  He was having abdominal pain, fever, and felt terrible.  For these reasons, an admission was arranged.  PHYSICAL EXAMINATION:  VITAL SIGNS:  Temperature 97, pulse 78, blood pressure 124/76.  GENERAL:  Alert white male, quite pale.  HEART:  Normal.  LUNGS:  Normal.  ABDOMEN:  Soft, nondistended, hyperactive bowel sounds.  Generally nontender.  RECTAL:  Stool was loose, brown, and grossly positive for blood.  For more details, please see the dictated admission history and physical.  HOSPITAL COURSE:  The patient was admitted to a medical floor, and stool workup was obtained.  C. diff toxin was checked twice and was negative.  E. coli [id number] toxin was also checked and was negative.  Stool white blood cells and one smear were negative.  One showed slight white blood cells.  Urinalysis was unremarkable.  Blood cultures were obtained when the patient spiked a temperature to 102, and these were negative at the time of discharge.  Initial hemoglobin was 8.8, which  really has not changed very much from his previous hemoglobin.  MCV was low at 71.  Sedimentation rate was elevated at 126.  The day following his admission the patient underwent an unprepped colonoscopy showing diffuse colitis throughout the entire colon.  Multiple biopsies were obtained which ultimately at the time of discharge showed inflammatory bowel disease consistent with ulcerative colitis.  The patient was given IV fluids and felt much better.  After the colonoscopy we started him on Solu-Medrol, and he continued to feel much better.  He did initially require pain medication, but after 24 hours on the Solu-Medrol he was feeling much better and did not need pain medication, and was tolerating clear liquids.  Asacol was started as well.  The patient was seen on the day of discharge.  He was tolerating Ensure, clear liquids, walking in the halls, feeling much, much better.  He was not requiring IV fluids.  It was felt he could be managed at home with dietary restrictions.  DISPOSITION:  The patient is discharged home.  DISCHARGE MEDICATIONS: 1. Prednisone 20 mg b.i.d. 2. Asacol two tablets t.i.d.  He will be instructed to avoid all nonsteroidal medications.  DIET:  Restricted, Ensure t.i.d. or q.i.d., pasta, potatoes, with small amounts of lean meat and eggs.  FOLLOWUP:  He will see Dr. Oletta Lamas back in the office in three weeks. He will be instructed to call and make an appointment. Dictated by:   Joyice Faster. Oletta Lamas, M.D. Attending Physician:  Laurence Spates  Milbert Coulter DD:  01/17/01 TD:  01/19/01 Job: [id number]

## 2010-06-16 NOTE — H&P (Signed)
[place]. [facility]  Patient:    Austin Chang, Austin Chang Visit Number: [id number] MRN: [id number]          Service Type: MED Location: [phone] Attending Physician:  Austin Chang Dictated by:   Austin Chang. Austin Chang, M.D. Admit Date:  01/14/2001   CC:         Austin Chang, M.D., [facility]  Austin Chang, M.D.   History and Physical  CHIEF COMPLAINT: Abdominal pain, bloody diarrhea, severe anemia.  HISTORY OF PRESENT ILLNESS: [name] is a 45 year old white male, who was last well around September 2002.  His main problems have been foot pain and he apparently has been seen at [facility] for severe foot pain and has had x-rays done of his feet.  Apparently there was some consideration of gout.  He has been on Indocin, Celebrex, Darvocet, and Percocet for his feet.  This has been severe.  It is not clear exactly how diagnosis of gout was made but the patient notes that the pain has been in the arch of his feet and worse in the morning.  He last had normal bowel movements about six weeks to eight weeks and then began to have loose stools.  He has had progressively loose stools. He has been seen with progressive diarrhea and notes over the past three weeks or so it has gotten progressively worse, much worse over the past week - up to 10-15 bowel movements day, two to three nocturnal stools.  The stools have been bloody and he has been seen at [facility].  Has seen a progressive drop in his hemoglobin from 10.5 down to 9.1 most recently with low MCV of 71.5. The patient has intermittently seen blood but all stool tests checked at [facility]gent [facility] have been positive for blood.  In addition to this, his sedimentation rate has been 108.  He has complained of fever and chills, muscle aches, fatigue, joint pain, and general sensation of feeling sick. Whenever he eats he has to run and have diarrhea.  He has lost some weight but is unable to  tell me how much.  He has had a bit of nausea but not much.  He has been keeping liquids down and has not had any vomiting.  The patient notes he has been working through this but it has gotten progressively worse over the past week or so and he has been too sick to work.  We were able to make arrangements for a bed this evening.  We tried to admit him earlier but we had to wait for a bed, and one became available.  The patient was given Cipro two to three days ago and did not feel that this helped.  Has been treated with Imodium, Lomotil, and various over-the-counter antidiarrheal agents, none of which have helped.  CHRONIC MEDICATIONS: He has no chronic medicines.  ALLERGIES: He does note an allergy to [facility].  PAST MEDICAL HISTORY: Previous history of hypertension but has controlled that with a low sodium diet.  Has never been on medicines.  No other chronic problems.  PAST SURGICAL HISTORY: Previous tonsillectomy.  FAMILY HISTORY: Mother has had coronary disease, apparently has had a stent placed.  Father died of a heart attack.  SOCIAL HISTORY: The patient is an Chief Financial Officer here in town.  Drinks occasionally.  Does not smoke.  Is married and has children but is currently separated from his wife.  He reports they are on  good terms.  He does live alone.  REVIEW OF SYSTEMS: Remarkable for severe fatigue, poor appetite, some weight loss.  He has not had any chest pain or pulmonary symptoms but has felt progressively weak.  His primary Review Of Systems is severe pain in his feet along the arch of both feet, usually worse in the morning when he first stands up.  In fact, it is so severe that he has to walk with a cane in the morning and gradually gets better during the day, and he is able to put the cane down and not use the cane.  PHYSICAL EXAMINATION:  VITAL SIGNS: Temperature 97 degrees, pulse 78, blood pressure 124/76.  GENERAL: Alert white male, who appears pale  but is in no obvious distress.  HEENT: Sclerae nonicteric.  EOMI.  Ears, nose, and throat grossly normal. Mucous membranes dry.  LUNGS: Clear.  HEART: Regular rate and rhythm without murmurs, rubs, or gallops.  ABDOMEN: Soft, nondistended, with hyperactive bowel sounds.  Careful palpation reveals the abdomen to be generally nontender.  RECTAL: Stool was very loose and brown with slight streaks of blood.  Anal canal feels somewhat stenotic.  EXTREMITIES: No pitting edema.  Arch of both feet tender, particularly near the heel, consistent with heel spur syndrome or plantar fasciitis, the left greater than the right.  LABORATORY DATA: No laboratories on this admission are pending.  Laboratories mentioned earlier were obtained early this morning at [facility].  ASSESSMENT:  1. Bloody diarrhea.  Anemia.  I think the prime concern here given the length     of time this gentleman has had this is whether or not this is infectious     or due to inflammatory bowel disease.  The drop in hemoglobin of a gradual     nature and the indication of very low iron with low MCV would certainly be     more consistent with inflammatory bowel disease and I think we need to go     ahead and eliminate this.  2. Bilateral foot pain.  I suspect this is probably plantar fasciitis based     on his physical examination and his history of worsening pain in the     morning.  There is some question of gout but I am not really sure what     this is based on.  His toes seem to be pain-free currently.  PLAN: Will admit and give IV fluids.  Will plan on unprepped colonoscopy or sigmoidoscopy tomorrow.  I have discussed this with the patient and he is agreeable.  Will obtain routine laboratories, transfuse as needed, culture stools and look for C. difficile colitis, etc. Dictated by:   Austin Chang. Austin Chang, M.D. Attending Physician:  Austin Chang DD:  01/14/01 TD:  01/15/01 Job: [id number]  [id number]

## 2010-06-16 NOTE — H&P (Signed)
NAME:  KENAN, MOODIE                           ACCOUNT NO.:  [id number]   MEDICAL RECORD NO.:  [id number]                   PATIENT TYPE:  INP   LOCATION:  4741                                 FACILITY:  [facility]   PHYSICIAN:  Dwaine Deter, M.D.          DATE OF BIRTH:  06-13-1965   DATE OF ADMISSION:  04/10/2002  DATE OF DISCHARGE:                                HISTORY & PHYSICAL   CHIEF COMPLAINT:  GI bleed.   HISTORY OF PRESENT ILLNESS:  Mr. Ferrone is a pleasant 45 year old male who  awakened early this morning with grossly voluminous bright red blood per  rectum.  He had some mild crampy abdominal pain with that.  He came to the  emergency room feeling quite weak and faint.  Hemoglobin was noted to be 6.6  and blood pressure was 120 lying and dropped to 70 sitting.  He states he  has been drinking five or six beers a day and taking about four Advil a day  due to cervical DJD.  He has apparently seen Dr. Oval Linsey for cervical DJD  in the past.  He also has been taking some prednisone on an intermittent  basis, 5 mg or 10 mg a day, for this joint pain as well.  He is admitted now  for IV therapy, transfusions and work up for the source of the GI bleed.   PAST MEDICAL HISTORY:  1. Gout for years - no recent flares.  2. Pancolitis diagnosed in December, 2002 and had cleared by the summer of     2003 by colonoscopy per patient's history.  He has had no further blood     per rectum until this voluminous blood per rectum this morning.  3. DJD of the neck.   PAST SURGICAL HISTORY:  Tonsillectomy.   ALLERGIES:  To SULFA medications.   MEDICATIONS:  Prednisone as above.   FAMILY HISTORY:  Noncontributory.   SOCIAL HISTORY:  He smokes tobacco, uses five or six beers a day.  He is  single, has a girlfriend who is present with him.   REVIEW OF SYSTEMS:  No history of melena, no history of abdominal pain  except for the crampy pain this morning, no history of peptic ulcer  disease.  He does appear weak and he is not complaining of chest pain or shortness of  breath.   PHYSICAL EXAMINATION:  GENERAL:  On physical exam well-developed, well-  nourished, pale appearing, without pain or shortness of breath.  He is pale.  His conjunctiva are pale.  NECK:  Supple, without JVD.  VITALS:  Blood pressure 128/70, 70/25 sitting.  Sinus tachycardia at 120.  Pulse is regular.  Temperature is 98.7.  Oxygen saturation on room air is  98% and 100% on two liters.  CHEST:  Clear to auscultation.  Regular rate and rhythm with increased rate,  no murmur, rub or gallop.  ABDOMEN:  Soft  with mild tenderness periumbilical to palpation.  RECTAL:  Per Dr. Winfred Leeds has grossly positive blood and no other  abnormalities by his history.  EXTREMITIES:  Pale but without edema and they are warm.  NEUROLOGICAL:  He is oriented x3, nonfocal exam.  SKIN:  Without rashes.   LABORATORY DATA:  Initial laboratory reveal hemoglobin 6.6.  EKG revealed  sinus tach without S-T segment depression or elevation.   ASSESSMENT:  A 45 year old male with probable upper GI bleed with rapid  transient with probable temporary hiatus and bleeding.  Consulted GI and  talked to Dr. Laurence Spates who is going to contact Dr. Mikki Santee Buccini who is  covering hospital consults today.   Will transfuse two units of blood.  Check hemoglobin every six hours.  Will  treat with IV Protonix and keep him NPO for possible endoscopy ASAP.   He does have a history of ulcerative colitis.  I doubt that this is the  primary problem as his symptoms are so acute.  I suspect either a gastric or  a peptic ulcer.                                               Dwaine Deter, M.D.    RNG/MEDQ  D:  04/10/2002  T:  04/10/2002  Job:  [id number]   cc:   Ronald Lobo, M.D.  [address] [place]  [place], [place] [address]  Fax: Juana Di­az. Rolla Flatten., M.D.  [address] [place]  [place]  [place]  [address]  Fax: [phone]

## 2010-06-16 NOTE — Procedures (Signed)
[place]. [facility]  Patient:    Austin Chang, Austin Chang Visit Number: [id number] MRN: [id number]          Service Type: MED Location: [phone] [id number] Attending Physician:  Sherrin Daisy Dictated by:   Joyice Faster. Oletta Lamas, M.D. Proc. Date: 01/14/01 Admit Date:  01/14/2001   CC:         Dr. Irean Hong, M.D.   Procedure Report  PROCEDURE:  Colonoscopy and biopsy.  SURGEON:  James L. Edwards, M.D.  MEDICATIONS:  Fentanyl 100 mcg and Versed 10 mg IV.  SCOPE:  Olympus pediatric video colonoscope.  INDICATIONS:  The patient with bloody diarrhea for several weeks, weight loss, and drop in hemoglobin consistent with iron-deficiency.  This is done in the unprepped state.  He has been having profuse diarrhea, watery stools, and bloody nocturnal stools, and we went ahead and did the procedure with no prep.  DESCRIPTION OF PROCEDURE:  The procedure had been explained to the patient and consent obtained.  With the patient in the left lateral decubitus position, the Olympus pediatric video colonoscope was inserted and advanced under direct visualization.  The colon was entered.  The rectum seemed a bit edematous, but not ulcerated, but as we went up to the sigmoid colon, there is diffuse edema and ________ ulcers.  This process continued throughout the entire colon all the way to the cecum.  The ileocecal valve was seen and entered.  The terminal ileum was endoscopically normal for about 5 cm.  The scope was withdrawn back into the cecum.  The colon was photographed.  Multiple biopsies were taken. In jar #1 was the right colon and transverse colon.  In jar #2, the left sigmoid colon.  The scope was withdrawn.  The patient tolerated the procedure well.  ASSESSMENT:  Diffuse colitis, probably ulcerative colitis based on the long history of slow drop in hemoglobin consistent with iron-deficiency anemia.  PLAN:  We will go ahead and continue  clear liquids, IV fluid.  Will await the cultures and pathology report which will be back in a couple of days.  I think it would be prudent to go ahead and start some IV Solu-Medrol. Dictated by:   Joyice Faster. Oletta Lamas, M.D. Attending Physician:  Sherrin Daisy DD:  01/15/01 TD:  01/16/01 Job: [id number]

## 2010-06-16 NOTE — Op Note (Signed)
NAME:  Austin Chang, Austin Chang                           ACCOUNT NO.:  [id number]   MEDICAL RECORD NO.:  [id number]                   PATIENT TYPE:  INP   LOCATION:  1823                                 FACILITY:  [facility]   PHYSICIAN:  Ronald Lobo, M.D.                DATE OF BIRTH:  1965-04-28   DATE OF PROCEDURE:  04/10/2002  DATE OF DISCHARGE:                                 OPERATIVE REPORT   PROCEDURE:  Upper endoscopy with biopsies.   INDICATIONS FOR PROCEDURE:  This is a 45 year old male with a history of  apparent ulcerative colitis, presented to the hospital today with  hematochezia, syncope, and a hemoglobin of 6.6.  She has been on ibuprofen  and prednisone in the recent past due to arthritic symptoms, and has had  some mild dyspepsia over the past several days.  His BUN is normal.  The  anemia is microcytic.  However, I feel we need to exclude an upper tract  source of the bleeding given his clinical instability and the severity of  the anemia.   FINDINGS:  Antral erosion consistent with NSAID exposure, but no blood or  likely source of hemorrhage observed.   DESCRIPTION OF PROCEDURE:  The nature, purpose, and risks of the procedure  had been reviewed with the patient who provided written consent. He was  brought from the emergency room to the endoscopy unit.  Topical pharyngeal  anesthesia with Cetacaine spray and intravenous sedation with Versed 7 mg IV  were administered. No problem with desaturation or hypotension. He  maintained his baseline sinus tachycardia.   The Olympus small caliber adult video endoscope was passed under direct  vision. The vocal cords were not well seen.  The esophagus was  endoscopically normal without evidence of reflux esophagitis, free reflux,  Barrett's esophagus, varices, infection, neoplasia, or any Mallory-Weiss  tear. No ring or stricture was appreciated, but there was a small hiatal  hernia.   The stomach contained no blood or coffee  ground material, nor any  significant residual. In the antrum, was a clean-based, roughly 4 mm,  superficial ulceration. It had no stigma of hemorrhage. It was completely  benign in appearance. There was no diffuse gastritis and no other erosions  or ulcers were noted elsewhere in the stomach including a retroflexed view  of the proximal stomach which showed no significant abnormalities.   The pyloris, duodenal bulb, and second duodenum looked normal. There was  amber-colored bile present. In particular, there was no evidence of any  duodenal ulcerations. The duodenum villous mucosal pattern looked normal;  several biopsies of it were obtained to help rule out celiac disease as a  possible coexistent factor given his chronic microcytic anemia.   The scope was then removed from the patient. He tolerated the procedure well  and there were no apparent complications.   IMPRESSION:  Small antral erosion/superficial ulceration, not likely  relevant to the patient's presentation of hematochezia and anemia.   PLAN:  Await pathology on duodenal biopsies and proceed to colonoscopic  evaluation.                                               Ronald Lobo, M.D.    RB/MEDQ  D:  04/10/2002  T:  04/11/2002  Job:  [id number]   cc:   Dwaine Deter, M.D.  [address]  [place] [address]  Fax: [place] Rolla Flatten., M.D.  [address] [place]  [place]  [place] [address]  Fax: [phone]   Bo Merino, M.D.  [address]  [place], [place] [address]  Fax: [phone]

## 2010-06-16 NOTE — Op Note (Signed)
NAME:  Austin Chang, Austin Chang                           ACCOUNT NO.:  [id number]   MEDICAL RECORD NO.:  [id number]                   PATIENT TYPE:  INP   LOCATION:  1823                                 FACILITY:  [facility]   PHYSICIAN:  Ronald Lobo, M.D.                DATE OF BIRTH:  October 19, 1965   DATE OF PROCEDURE:  04/10/2002  DATE OF DISCHARGE:                                 OPERATIVE REPORT   PROCEDURE:  Colonoscopy with biopsies.   INDICATION:  This is a 45 year old gentleman who had a hospitalization for  apparent ulcerative colitis of new onset in December of 2002 and who was  taken off his Asacol last summer, seemingly with quiescent colitis.  He  presented to the emergency room today after two episodes of hematochezia at  home with associated syncope.  Upper endoscopy is unrevealing for a source  of hematochezia.   FINDINGS:  Colitis involving all but the rectosigmoid.  Terminal ileum  normal.  Mucoid serosanguineous bloody fluid and fecal effluent present.   PROCEDURE:  The patient provided written consent for flexible sigmoidoscopy,  which was the presumed procedure we would be doing, but during the course of  the procedure, it became evident that there was active inflammation higher  up and it was not clear whether it was ischemic or chronic ulcerative  colitis, so I felt it was important to examine the entire colon to clarify  the picture.   This procedure was performed immediately following his upper endoscopy.  Total sedation for the two procedures was Versed 10 mg IV (no fentanyl).   Digital rectal exam showed what appeared to be anal stenosis or at least  some degree of increased anal sphincter tone such that I really could not  get a good exam of the prostate gland, even with the patient sedated.  The  procedure was initiated with the Olympus small caliber adult video  endoscope, which had been used for his upper endoscopy, but when it became  clear that it was more  appropriate to examine the entire colon, I came out  with that scope, retroflexed, which showed a normal distal rectum apart from  some puddling of bloody mucoid fecal effluent, and then the patient was re-  examined using the Olympus full tension pediatric video colonoscope, which  was easily advanced to the terminal ileum.  Pullback was then performed.   The terminal ileum had a normal appearance for approximately 10 cm.  A  single biopsy was obtained.   The cecum had significant inflammation characterized by patchy erythema and  mucosal edema.  A biopsy was obtained there was well.   The ascending colon had linear exudate and focal mucosal hemorrhages.  Biopsies were obtained there as well.  This appearance persisted pretty much  all the way around the colon, in some areas with a little bit more mucosal  edema, to approximately 30  cm, whereupon the mucosa developed a normal  appearance.  Biopsies were obtained from the rectosigmoid mucosa, which  appeared normal for comparison.  No polyps, cancer, vascular ectasia, or  diverticular disease was observed on this exam.   Although it was done unprepped, there was essentially no stool in the colon;  just some small patches of mucoid brown material and the above-mentioned  bloody brown mucoid fecal effluent.  The patient tolerated the procedure  well and there were no apparent complications.   IMPRESSION:  Extensive colitis with rectosigmoid sparing and a normal-  appearing terminal ileum.  This is probably an atypical form of chronic  inflammatory bowel disease (that is, I doubt that it is ischemia given the  extensive distribution, but the rectosigmoid sparing would be unusual for  ulcerative colitis).  This might be a variance of Crohn's colitis.   PLAN:  Await pathology results.                                               Ronald Lobo, M.D.    RB/MEDQ  D:  04/10/2002  T:  04/11/2002  Job:  [id number]   cc:   Jeneen Rinks L. Rolla Flatten., M.D.  [address] [place]  [place]  [place] [address]  Fax: [phone]   Bo Merino, M.D.  [address]  [place], [place] [address]  Fax: [phone]

## 2010-06-16 NOTE — Discharge Summary (Signed)
NAME:  Austin Chang, Austin Chang                           ACCOUNT NO.:  [id number]   MEDICAL RECORD NO.:  [id number]                   PATIENT TYPE:  INP   LOCATION:  0539                                 FACILITY:  [facility]   PHYSICIAN:  Dwaine Deter, M.D.          DATE OF BIRTH:  1965-09-27   DATE OF ADMISSION:  04/10/2002  DATE OF DISCHARGE:  04/13/2002                                 DISCHARGE SUMMARY   DISCHARGE DIAGNOSES:  1. Pancolitis with sparing of the rectosigmoid, likely ulcerative colitis,     but possible atypical Crohn's.  Pathology pending.  2. Iron deficiency anemia secondary to #1.  3. Acute gastrointestinal bleed 04/10/02 secondary to #1.  4. History of gout.  5. Pancolitis diagnosed 12/02 and cleared by summer 2003.  Followed by Dr.     Laurence Spates.  6. Degenerative joint disease of the neck.   DISCHARGE MEDICATIONS:  1. Prednisone 40 mg daily.  2. Iron sulfate 325 mg t.i.d.  3. Asacol 400 mg t.i.d.   CONSULTATIONS:  Ronald Lobo, M.D., gastroenterology.   PROCEDURES:  1. Upper endoscopy.  2. Colonoscopy.   DISCHARGE LABORATORY DATA:  CBC:  Hemoglobin 7.6, white count 10,300, MCV  62.7, platelet count 449,000.  Chem-7 from 04/11/02:  Sodium 139, potassium  4.2, chloride 109, bicarb 27, BUN 8, creatinine 0.8, blood sugar 187.  Pro  time 15.6 seconds on 04/11/02.  INR 1.3.  LFTs on 04/11/02 reveal an AST of  14, ALT of 9, alkaline phosphatase 55, total bilirubin 0.5.   HOSPITAL COURSE:  The patient is a pleasant 45 year old male with a  diagnosis of panulcerative colitis in 12/02.  This had cleared by summer of  2003.  The patient was asymptomatic by history in terms of melena, bright  red blood per rectum or any evidence of  GI bleed since summer of 2003.   On the day of admission, the patient awakened and had several very large  bloody bowel movements and on admission to the ER hemoglobin was 6.6.  He  was very pale appearing and orthostatic.  The patient  was transfused with  two units of packed red cells and hemoglobin had been stable over the  weekend.  The patient did receive upper endoscopy and colonoscopy by Dr. Mikki Santee  Buccini on day of admission 04/10/02 and on upper endoscopy small antral  erosion and superficial ulceration was noted, but was felt not to be  relevant to the patient's presentation of hematochezia and anemia.   Colonoscopy was performed on 04/10/02 as well and extensive colitis with  rectosigmoid sparing and normal appearing terminal ileum were noted.  This  was felt to be probably an atypical form of chronic inflammatory bowel  disease.  Dr. Cristina Gong thought that it was not likely ischemia given the  extensive distribution and the rectosigmoid sparing would be unusual for  classic ulcerative colitis.   Regardless, the patient's condition  stabilized over the weekend with no  further gross bleeding.  Hemoglobin was stable in the  mid 7 range.  The  patient was ambulating well and eating well at the time of discharge.  He  will be discharged on the above medications and to follow up with Dr. Laurence Spates in one week.  If he notices any gross bleeding, lightheadedness,  dizziness, he should notify Dr. Inda Merlin, myself, or Dr. Oletta Lamas.                                                 Dwaine Deter, M.D.    RNG/MEDQ  D:  04/13/2002  T:  04/13/2002  Job:  [id number]   cc:   Jeneen Rinks L. Rolla Flatten., M.D.  [address], [place]  [place] [address]  Fax: [phone]   Ronald Lobo, M.D.  [address], [place]  [place], [place] [address]  Fax: [phone]   Bo Merino, M.D.  [address]  [place], [place] [address]  Fax: [phone]

## 2010-07-07 ENCOUNTER — Other Ambulatory Visit (INDEPENDENT_AMBULATORY_CARE_PROVIDER_SITE_OTHER): Payer: Self-pay | Admitting: Surgery

## 2010-07-07 DIAGNOSIS — Z09 Encounter for follow-up examination after completed treatment for conditions other than malignant neoplasm: Secondary | ICD-10-CM

## 2010-07-13 ENCOUNTER — Other Ambulatory Visit: Payer: 59

## 2010-07-18 ENCOUNTER — Other Ambulatory Visit: Payer: 59

## 2010-07-19 ENCOUNTER — Ambulatory Visit
Admission: RE | Admit: 2010-07-19 | Discharge: 2010-07-19 | Disposition: A | Discharge status: Home / Self Care | Payer: 59 | Source: Ambulatory Visit | Attending: Surgery | Admitting: Surgery

## 2010-07-19 DIAGNOSIS — Z09 Encounter for follow-up examination after completed treatment for conditions other than malignant neoplasm: Secondary | ICD-10-CM

## 2010-08-04 ENCOUNTER — Other Ambulatory Visit (INDEPENDENT_AMBULATORY_CARE_PROVIDER_SITE_OTHER): Payer: Self-pay | Admitting: Surgery

## 2010-08-04 ENCOUNTER — Ambulatory Visit (HOSPITAL_COMMUNITY)
Admission: RE | Admit: 2010-08-04 | Discharge: 2010-08-04 | Disposition: A | Discharge status: Home / Self Care | Payer: 59 | Source: Ambulatory Visit | Attending: Surgery | Admitting: Surgery

## 2010-08-04 ENCOUNTER — Encounter (HOSPITAL_COMMUNITY)
Admission: RE | Admit: 2010-08-04 | Discharge: 2010-08-04 | Disposition: A | Discharge status: Home / Self Care | Payer: 59 | Source: Ambulatory Visit | Attending: Surgery | Admitting: Surgery

## 2010-08-04 DIAGNOSIS — Z01812 Encounter for preprocedural laboratory examination: Secondary | ICD-10-CM | POA: Insufficient documentation

## 2010-08-04 DIAGNOSIS — M539 Dorsopathy, unspecified: Secondary | ICD-10-CM | POA: Insufficient documentation

## 2010-08-04 DIAGNOSIS — Z01818 Encounter for other preprocedural examination: Secondary | ICD-10-CM | POA: Insufficient documentation

## 2010-08-04 DIAGNOSIS — Z933 Colostomy status: Secondary | ICD-10-CM

## 2010-08-04 LAB — COMPREHENSIVE METABOLIC PANEL
ALT: 11 U/L (ref 0–53)
AST: 14 U/L (ref 0–37)
Albumin: 3.6 g/dL (ref 3.5–5.2)
Alkaline Phosphatase: 124 U/L — ABNORMAL HIGH (ref 39–117)
BUN: 10 mg/dL (ref 6–23)
CO2: 27 mEq/L (ref 19–32)
Calcium: 9.3 mg/dL (ref 8.4–10.5)
Chloride: 102 mEq/L (ref 96–112)
Creatinine, Ser: 1.19 mg/dL (ref 0.50–1.35)
GFR calc Af Amer: >60 mL/min (ref >60)
GFR calc non Af Amer: >60 mL/min (ref >60)
Glucose, Bld: 85 mg/dL (ref 70–99)
Potassium: 4.7 mEq/L (ref 3.5–5.1)
Sodium: 137 mEq/L (ref 135–145)
Total Bilirubin: 0.3 mg/dL (ref 0.3–1.2)
Total Protein: 7.8 g/dL (ref 6.0–8.3)

## 2010-08-04 LAB — DIFFERENTIAL
Basophils Absolute: 0.1 10*3/uL (ref 0.0–0.1)
Basophils Relative: 1 % (ref 0–1)
Eosinophils Absolute: 0.5 10*3/uL (ref 0.0–0.7)
Eosinophils Relative: 7 % — ABNORMAL HIGH (ref 0–5)
Lymphocytes Relative: 18 % (ref 12–46)
Lymphs Abs: 1.2 10*3/uL (ref 0.7–4.0)
Monocytes Absolute: 0.5 10*3/uL (ref 0.1–1.0)
Monocytes Relative: 7 % (ref 3–12)
Neutro Abs: 4.3 10*3/uL (ref 1.7–7.7)
Neutrophils Relative %: 67 % (ref 43–77)

## 2010-08-04 LAB — CBC
HCT: 33.8 % — ABNORMAL LOW (ref 39.0–52.0)
Hemoglobin: 10.7 g/dL — ABNORMAL LOW (ref 13.0–17.0)
MCH: 23 pg — ABNORMAL LOW (ref 26.0–34.0)
MCHC: 31.7 g/dL (ref 30.0–36.0)
MCV: 72.5 fL — ABNORMAL LOW (ref 78.0–100.0)
Platelets: 288 10*3/uL (ref 150–400)
RBC: 4.66 MIL/uL (ref 4.22–5.81)
RDW: 18.7 % — ABNORMAL HIGH (ref 11.5–15.5)
WBC: 6.5 10*3/uL (ref 4.0–10.5)

## 2010-08-04 LAB — SURGICAL PCR SCREEN
MRSA, PCR: NEGATIVE
Staphylococcus aureus: NEGATIVE

## 2010-08-14 ENCOUNTER — Inpatient Hospital Stay (HOSPITAL_COMMUNITY)
Admission: RE | Admit: 2010-08-14 | Discharge: 2010-08-19 | DRG: 345 | Disposition: A | Discharge status: Home / Self Care | Payer: 59 | Source: Ambulatory Visit | Attending: Surgery | Admitting: Surgery

## 2010-08-14 ENCOUNTER — Other Ambulatory Visit (INDEPENDENT_AMBULATORY_CARE_PROVIDER_SITE_OTHER): Payer: Self-pay | Admitting: Surgery

## 2010-08-14 DIAGNOSIS — Z433 Encounter for attention to colostomy: Principal | ICD-10-CM

## 2010-08-14 DIAGNOSIS — K5190 Ulcerative colitis, unspecified, without complications: Secondary | ICD-10-CM | POA: Diagnosis present

## 2010-08-14 DIAGNOSIS — I10 Essential (primary) hypertension: Secondary | ICD-10-CM | POA: Diagnosis present

## 2010-08-14 DIAGNOSIS — Z79899 Other long term (current) drug therapy: Secondary | ICD-10-CM

## 2010-08-14 DIAGNOSIS — M109 Gout, unspecified: Secondary | ICD-10-CM | POA: Diagnosis present

## 2010-08-15 LAB — COMPREHENSIVE METABOLIC PANEL
ALT: 10 U/L (ref 0–53)
AST: 11 U/L (ref 0–37)
Albumin: 2.5 g/dL — ABNORMAL LOW (ref 3.5–5.2)
Alkaline Phosphatase: 90 U/L (ref 39–117)
BUN: 11 mg/dL (ref 6–23)
CO2: 23 mEq/L (ref 19–32)
Calcium: 7.5 mg/dL — ABNORMAL LOW (ref 8.4–10.5)
Chloride: 107 mEq/L (ref 96–112)
Creatinine, Ser: 1.15 mg/dL (ref 0.50–1.35)
GFR calc Af Amer: >60 mL/min (ref >60)
GFR calc non Af Amer: >60 mL/min (ref >60)
Glucose, Bld: 114 mg/dL — ABNORMAL HIGH (ref 70–99)
Potassium: 4.2 mEq/L (ref 3.5–5.1)
Sodium: 137 mEq/L (ref 135–145)
Total Bilirubin: 0.4 mg/dL (ref 0.3–1.2)
Total Protein: 5.9 g/dL — ABNORMAL LOW (ref 6.0–8.3)

## 2010-08-15 LAB — CBC
HCT: 32.1 % — ABNORMAL LOW (ref 39.0–52.0)
Hemoglobin: 10 g/dL — ABNORMAL LOW (ref 13.0–17.0)
MCH: 23.3 pg — ABNORMAL LOW (ref 26.0–34.0)
MCHC: 31.2 g/dL (ref 30.0–36.0)
MCV: 74.8 fL — ABNORMAL LOW (ref 78.0–100.0)
Platelets: 276 10*3/uL (ref 150–400)
RBC: 4.29 MIL/uL (ref 4.22–5.81)
RDW: 18.8 % — ABNORMAL HIGH (ref 11.5–15.5)
WBC: 10.1 10*3/uL (ref 4.0–10.5)

## 2010-08-16 NOTE — Op Note (Signed)
NAMESOSTENES, KAUFFMANN NO.:  [id number]  MEDICAL RECORD NO.:  [id number]  LOCATION:  0947                         FACILITY:  [facility]  PHYSICIAN:  Marcello Moores A. Rodrigo Mcgranahan, M.D.DATE OF BIRTH:  [birth year]  DATE OF PROCEDURE:  08/14/2010 DATE OF DISCHARGE:                              OPERATIVE REPORT   PREOPERATIVE DIAGNOSIS:  History of diverticulitis with colocolostomy.  POSTOPERATIVE DIAGNOSIS:  History of diverticulitis with colocolostomy.  PROCEDURES: 1. Closure of colostomy. 2. Rigid sigmoidoscopy.  SURGEON:  Marcello Moores A. Kristopher Delk, MD  ANESTHESIA:  General endotracheal anesthesia.  ESTIMATED BLOOD LOSS:  100 mL.  SPECIMEN:  Colostomy remnants to pathology.  ASSISTANT:  Orson Ape. Rise Patience, MD  DRAINS:  None.  INDICATIONS FOR PROCEDURE:  The patient is a 45 year old male with ulcerative colitis.  He had had a perforated diverticulitis about 4 months ago that required a laparotomy resection and diverting colostomy. He has recovered from all of that.  He has ulcerative colitis and actually saw a second opinion about his condition and further surgical therapy.  He did not wish to undergo a total colectomy and wished to undergo closure of his colostomy.  I discussed the procedure at length with him.  Risks of bleeding, infection, anastomotic leakage with sepsis, death, injury to neighboring structures to include, but not exclusive of kidney, ureter, bladder, small bowel and other portion of his colon, liver, stomach, abdominal wall, and hernia were discussed. Alternative therapies would be keep his colostomy in order to undergo a total proctocolectomy with ileoanal J pouch, but he did not want to do any of that.  After discussion of the above, he agreed to proceed. Questions were answered.  DESCRIPTION OF PROCEDURE:  The patient was brought to the operating room.  After induction of general anesthesia, I closed his colostomy with a 2-0 silk.  Abdomen and  perineum were prepped and draped in sterile fashion.  He was placed in lithotomy.  He was properly padded and there was no undue tension on his lower extremities.  After time-out was done and sterile prep and drape were done, a midline incision was then done.  Of note, antibiotics were given within 30 minutes of incision.  I excised his old scar.  I went ahead and dissected all the way down to his fascia and opened the abdominal cavity without difficulty.  We entered the abdominal cavity without difficulty.  We opened the fascia under direct vision into the entire length.  The incision was opened.  We then took down the filmy adhesions without difficulty.  A retractor was placed area and we were able to retract the small bowels as well.  The stump of his distal sigmoid colon and rectal stump were identified.  This was easily dissected free.  We then found the colostomy and excised it down the abdominal wall without difficulty. There was adequate length.  This reached down in the pelvis quite easily with no tension, whatsoever.  I then used bowel clamps on the proximal end and trimmed off the old colostomy site.  We then opened the distal colon.  The barium enema was reviewed and I did not see any diverticula involving the  distal segment of any significance and proximally, I did not see any gross disease.  We elected to perform an end-to-end anastomosis in 2 layers with a combination of 3-0 Vicryl and 3-0 PDS. This was done with no tension, whatsoever on the anastomosis.  The anastomosis was patent.  I then went below and advanced a rigid sigmoidoscope and the proximal bowel to the anastomosis was clamped.  We went ahead and inflated with air and there was no leakage from the anastomosis noted.  The air was suctioned out.  The anastomosis laid in left lower quadrant without tension.  The abdominal cavity was irrigated.  No signs of injury to the small bowel or colon.  Bladder was normal as  well.  This was suctioned out.  The colostomy site was closed with #1 Novafil.  The fascia was closed with a running double-stranded #1 PDS.  The skin and subcu tissues were irrigated and closed loosely with staples and packed with 0.5-inch Iodoform packing.  Dressings were applied.  All final counts of sponge, needle, and instruments were found to be correct at this portion of the case.  The patient was awoke, extubated, and taken to recovery room in satisfactory condition.     Ashtin Melichar A. Mylia Pondexter, M.D.     TAC/MEDQ  D:  08/14/2010  T:  08/15/2010  Job:  [id number]  cc:   Jeneen Rinks L. Rolla Flatten., M.D.  Electronically Signed by Erroll Luna M.D. on 08/16/2010 01:00:27 AM

## 2010-08-17 ENCOUNTER — Inpatient Hospital Stay (HOSPITAL_COMMUNITY): Payer: 59

## 2010-08-17 LAB — COMPREHENSIVE METABOLIC PANEL
ALT: 9 U/L (ref 0–53)
AST: 10 U/L (ref 0–37)
Albumin: 2.6 g/dL — ABNORMAL LOW (ref 3.5–5.2)
Alkaline Phosphatase: 140 U/L — ABNORMAL HIGH (ref 39–117)
BUN: 3 mg/dL — ABNORMAL LOW (ref 6–23)
CO2: 26 mEq/L (ref 19–32)
Calcium: 8.5 mg/dL (ref 8.4–10.5)
Chloride: 102 mEq/L (ref 96–112)
Creatinine, Ser: 0.85 mg/dL (ref 0.50–1.35)
GFR calc Af Amer: >60 mL/min (ref >60)
GFR calc non Af Amer: >60 mL/min (ref >60)
Glucose, Bld: 129 mg/dL — ABNORMAL HIGH (ref 70–99)
Potassium: 3.3 mEq/L — ABNORMAL LOW (ref 3.5–5.1)
Sodium: 137 mEq/L (ref 135–145)
Total Bilirubin: 0.4 mg/dL (ref 0.3–1.2)
Total Protein: 6.5 g/dL (ref 6.0–8.3)

## 2010-08-17 LAB — CBC
HCT: 27.8 % — ABNORMAL LOW (ref 39.0–52.0)
Hemoglobin: 8.7 g/dL — ABNORMAL LOW (ref 13.0–17.0)
MCH: 23.5 pg — ABNORMAL LOW (ref 26.0–34.0)
MCHC: 31.3 g/dL (ref 30.0–36.0)
MCV: 74.9 fL — ABNORMAL LOW (ref 78.0–100.0)
Platelets: 296 10*3/uL (ref 150–400)
RBC: 3.71 MIL/uL — ABNORMAL LOW (ref 4.22–5.81)
RDW: 18.5 % — ABNORMAL HIGH (ref 11.5–15.5)
WBC: 8.1 10*3/uL (ref 4.0–10.5)

## 2010-08-18 LAB — TYPE AND SCREEN
ABO/RH(D): A POS
Antibody Screen: POSITIVE
DAT, IgG: NEGATIVE
Donor AG Type: NEGATIVE
Donor AG Type: NEGATIVE
Unit division: 0
Unit division: 0

## 2010-08-18 LAB — COMPREHENSIVE METABOLIC PANEL
ALT: 10 U/L (ref 0–53)
AST: 11 U/L (ref 0–37)
Albumin: 2.4 g/dL — ABNORMAL LOW (ref 3.5–5.2)
Alkaline Phosphatase: 132 U/L — ABNORMAL HIGH (ref 39–117)
BUN: 3 mg/dL — ABNORMAL LOW (ref 6–23)
CO2: 24 mEq/L (ref 19–32)
Calcium: 8.5 mg/dL (ref 8.4–10.5)
Chloride: 102 mEq/L (ref 96–112)
Creatinine, Ser: 0.77 mg/dL (ref 0.50–1.35)
GFR calc Af Amer: >60 mL/min (ref >60)
GFR calc non Af Amer: >60 mL/min (ref >60)
Glucose, Bld: 109 mg/dL — ABNORMAL HIGH (ref 70–99)
Potassium: 3.2 mEq/L — ABNORMAL LOW (ref 3.5–5.1)
Sodium: 136 mEq/L (ref 135–145)
Total Bilirubin: 0.7 mg/dL (ref 0.3–1.2)
Total Protein: 6.1 g/dL (ref 6.0–8.3)

## 2010-08-18 LAB — CBC
HCT: 26.2 % — ABNORMAL LOW (ref 39.0–52.0)
Hemoglobin: 8.3 g/dL — ABNORMAL LOW (ref 13.0–17.0)
MCH: 23.8 pg — ABNORMAL LOW (ref 26.0–34.0)
MCHC: 31.7 g/dL (ref 30.0–36.0)
MCV: 75.1 fL — ABNORMAL LOW (ref 78.0–100.0)
Platelets: 309 10*3/uL (ref 150–400)
RBC: 3.49 MIL/uL — ABNORMAL LOW (ref 4.22–5.81)
RDW: 18.7 % — ABNORMAL HIGH (ref 11.5–15.5)
WBC: 6.1 10*3/uL (ref 4.0–10.5)

## 2010-08-21 ENCOUNTER — Telehealth (INDEPENDENT_AMBULATORY_CARE_PROVIDER_SITE_OTHER): Payer: Self-pay | Admitting: General Surgery

## 2010-08-28 ENCOUNTER — Encounter (INDEPENDENT_AMBULATORY_CARE_PROVIDER_SITE_OTHER): Payer: Self-pay | Admitting: General Surgery

## 2010-08-28 ENCOUNTER — Encounter (INDEPENDENT_AMBULATORY_CARE_PROVIDER_SITE_OTHER): Payer: 59

## 2010-09-07 ENCOUNTER — Ambulatory Visit (INDEPENDENT_AMBULATORY_CARE_PROVIDER_SITE_OTHER): Payer: 59 | Admitting: Surgery

## 2010-09-07 ENCOUNTER — Encounter (INDEPENDENT_AMBULATORY_CARE_PROVIDER_SITE_OTHER): Payer: Self-pay | Admitting: Surgery

## 2010-09-07 DIAGNOSIS — Z9889 Other specified postprocedural states: Secondary | ICD-10-CM

## 2010-09-07 NOTE — Patient Instructions (Signed)
Use band aids for wounss.  Change daily. Return in 4-6 weeks.

## 2010-09-07 NOTE — Progress Notes (Signed)
The patient returns to clinic today. He is about 3-1/2 weeks out from colostomy closure. He is doing well.  On examination today, his midline incision is healing well. Old colostomy site is healing well. His abdomen is soft and nontender. Impression status post colostomy closure. Plan: He will return in one month for followup.

## 2010-09-18 NOTE — Discharge Summary (Signed)
  NAMEISSAAC, SHIPPER NO.:  [id number]  MEDICAL RECORD NO.:  [id number]  LOCATION:  2500                         FACILITY:  [facility]  PHYSICIAN:  Marcello Moores A. Amy Gothard, M.D.DATE OF BIRTH:  [birth year]  DATE OF ADMISSION:  08/14/2010 DATE OF DISCHARGE:  08/19/2010                              DISCHARGE SUMMARY   ADMITTING DIAGNOSES: 1. History of diverticulitis with abscess and subsequent colostomy. 2. Ulcerative colitis.  DISCHARGE DIAGNOSES: 1. History of diverticulitis with abscess and subsequent colostomy. 2. Ulcerative colitis.  PROCEDURE PERFORMED:  Closure of colostomy.  BRIEF HISTORY:  The patient is a pleasant 45 year old male who underwent a sigmoid colectomy with colostomy due to a large pelvic abscess from diverticulitis about 4 months ago.  He returns for closure of his colostomy.  Please see office notes and operative note for details of this.  HOSPITAL COURSE:  Unremarkable.  He had an ileus for about 2 days and then he began to have bowel function.  He was on the Entereg protocol. His wound was clean, dry and intact.  His diet was advanced over 3 days and discharged home on hospital day #5.  Wound was clean, dry, and intact.  He was afebrile.  He had a good control of his pain on p.o. pain medications.  Laboratory work was within normal limits.  His bowels were moving.  DISCHARGE INSTRUCTIONS:  He will come back and see me in 2 weeks or so to have a staples removed.  He will continue wound care as instructed. Dry dressings will be applied as needed.  He will go home on Percocet for pain one to two tabs q.4 h. p.r.n.  He will resume his home medications.  He should continue follow up with his gastroenterologist, Dr. Laurence Spates of [facility].  CONDITION ON DISCHARGE:  Improved.     Paislee Szatkowski A. Xiana Carns, M.D.     TAC/MEDQ  D:  09/11/2010  T:  09/11/2010  Job:  [id number]  cc:   Jeneen Rinks L. Rolla Flatten., M.D.  Electronically Signed by Erroll Luna M.D. on 09/18/2010 07:30:56 AM

## 2010-10-05 ENCOUNTER — Encounter (INDEPENDENT_AMBULATORY_CARE_PROVIDER_SITE_OTHER): Payer: 59 | Admitting: Surgery

## 2010-11-08 ENCOUNTER — Ambulatory Visit (INDEPENDENT_AMBULATORY_CARE_PROVIDER_SITE_OTHER): Payer: 59 | Admitting: Surgery

## 2010-11-08 ENCOUNTER — Encounter (INDEPENDENT_AMBULATORY_CARE_PROVIDER_SITE_OTHER): Payer: Self-pay | Admitting: Surgery

## 2010-11-08 VITALS — BP 146/78 | HR 80 | Temp 98.6°F | Resp 16 | Ht 68.0 in | Wt 220.4 lb

## 2010-11-08 DIAGNOSIS — Z9889 Other specified postprocedural states: Secondary | ICD-10-CM

## 2010-11-08 NOTE — Progress Notes (Signed)
Patient returns today for followup after colostomy closure. He is doing well no complaints.  On exam: Midline incision well healed abdomen. No evidence of hernia. Soft nontender.  Impression: Status post colostomy closure plan: He will follow up at this point in time as need be. Continue follow up with gastroenterology for his ulcerative colitis.

## 2010-11-08 NOTE — Patient Instructions (Signed)
You are released.  Call with any issues.  Continue follow up with Dr Oletta Lamas.

## 2010-12-04 ENCOUNTER — Emergency Department (HOSPITAL_COMMUNITY)
Admission: EM | Admit: 2010-12-04 | Discharge: 2010-12-04 | Discharge status: Against Medical Advice | Payer: 59 | Attending: Emergency Medicine | Admitting: Emergency Medicine

## 2010-12-04 ENCOUNTER — Encounter (HOSPITAL_COMMUNITY): Payer: Self-pay

## 2010-12-04 ENCOUNTER — Emergency Department (HOSPITAL_COMMUNITY): Payer: 59

## 2010-12-04 DIAGNOSIS — R1310 Dysphagia, unspecified: Secondary | ICD-10-CM | POA: Insufficient documentation

## 2010-12-04 DIAGNOSIS — I10 Essential (primary) hypertension: Secondary | ICD-10-CM | POA: Insufficient documentation

## 2010-12-04 DIAGNOSIS — T18108A Unspecified foreign body in esophagus causing other injury, initial encounter: Secondary | ICD-10-CM

## 2010-12-04 DIAGNOSIS — IMO0002 Reserved for concepts with insufficient information to code with codable children: Secondary | ICD-10-CM | POA: Insufficient documentation

## 2010-12-04 MED ORDER — GLUCAGON HCL (RDNA) 1 MG IJ SOLR
1.0000 mg | Freq: Once | INTRAMUSCULAR | Status: AC
  Administered 2010-12-04: 1 mg via INTRAVENOUS
  Filled 2010-12-04: qty 1

## 2010-12-04 NOTE — ED Notes (Signed)
Pt transferred here from [facility] for a food bolus stuck in throat.  Reports that he cleared the bolus en route here.  Denies choking sensation at this time.  A/O x 4, family at bedside.

## 2010-12-04 NOTE — ED Notes (Signed)
Report given to [facility]. 

## 2010-12-04 NOTE — ED Provider Notes (Signed)
History     CSN: [id number] Arrival date & time: 12/04/2010  3:04 PM   First MD Initiated Contact with Patient 12/04/10 1541      Chief Complaint  Patient presents with  . Dysphagia     HPI 45yoM h/o HTN  pw FB sensation throat. Pt states that approx 130P today he was eating boneless fried chicken and suddenly exp FB sensation in throat. Points to just above sternal notch. Denies choking. Not tolerating his own secretions. No vomiting since event. Denies shortness of breath, wheezing, chest pain. Reports similar episode one week ago which resolved after vomiting once. Denies other complaints today.  Past Medical History  Diagnosis Date  . Colitis   . Rectal bleeding   . Hypertension   . Arthritis   . Colitis     Past Surgical History  Procedure Date  . Colon surgery   . Spine surgery   . Colostomy closure   . Neck surgery     Family History  Problem Relation Age of Onset  . Heart disease Mother   . Heart disease Father     History  Substance Use Topics  . Smoking status: Never Smoker   . Smokeless tobacco: Not on file  . Alcohol Use: 2.5 oz/week    5 drink(s) per week    Review of Systems  All other systems reviewed and are negative.   Neg except as noted HPI  Allergies  Septra  Home Medications   Current Outpatient Rx  Name Route Sig Dispense Refill  . ACETAMINOPHEN 500 MG PO TABS Oral Take 500 mg by mouth every 6 (six) hours as needed. For pain.     Marland Kitchen AMLODIPINE BESYLATE-VALSARTAN 5-160 MG PO TABS Oral Take 1 tablet by mouth daily.      . INDOMETHACIN ER 75 MG PO CPCR Oral Take 75 mg by mouth 2 (two) times daily as needed. For inflammation.    Marland Kitchen MESALAMINE 0.375 G PO CP24 Oral Take 1,500 mg by mouth daily.     Marland Kitchen VITAMIN D (CHOLECALCIFEROL) PO Oral Take 2,000 Units by mouth daily.       BP 123/72  Pulse 115  Temp(Src) 98.7 F (37.1 C) (Oral)  Resp 18  SpO2 100%  Physical Exam  Nursing note and vitals reviewed. Constitutional: He is oriented  to person, place, and time. He appears well-developed and well-nourished. No distress.       Spitting into bucket  HENT:  Head: Atraumatic.  Mouth/Throat: Oropharynx is clear and moist.  Eyes: Conjunctivae are normal. Pupils are equal, round, and reactive to light.  Neck: Neck supple.  Cardiovascular: Normal rate, regular rhythm, normal heart sounds and intact distal pulses.  Exam reveals no gallop and no friction rub.   No murmur heard. Pulmonary/Chest: Effort normal. No respiratory distress. He has no wheezes. He has no rales.  Abdominal: Soft. Bowel sounds are normal. There is no tenderness. There is no rebound and no guarding.  Musculoskeletal: Normal range of motion. He exhibits no edema and no tenderness.  Neurological: He is alert and oriented to person, place, and time.  Skin: Skin is warm and dry.  Psychiatric: He has a normal mood and affect.    ED Course  Procedures (including critical care time)  Labs Reviewed - No data to display Dg Neck Soft Tissue  12/04/2010  *RADIOLOGY REPORT*  Clinical Data: Choking.  Dysphagia.  NECK SOFT TISSUES - 1+ VIEW  Comparison: None  Findings: Cervical fusion changes are noted.  No acute bony findings.  The epiglottis and aryepiglottic folds are normal.  No hypopharyngeal distention.  No prevertebral or retropharyngeal soft tissue swelling.  No radiopaque foreign body.  IMPRESSION: Negative lateral soft tissue examination of the neck.  Original Report Authenticated By: P. Loralie Champagne, M.D.     1. Foreign body in esophagus     MDM  Likely foreign body impacted in esophagus. Not improved with coke ingestion, glucagon. Still not handling secretions. Airway intact. D/W Dr. Pollyann Kennedy (ENT) who will scope. Requesting pt to be [facility] ED for scope. D/W Dr. Karma Ganja. D/W Charge nurse at [employer].   Stefano Gaul, MD        Forbes Cellar, MD 12/04/10 [phone]

## 2010-12-04 NOTE — ED Notes (Signed)
Family at bedside. 

## 2010-12-04 NOTE — ED Notes (Signed)
Per ems pt c/o dysphasia, have a piece of chicken in esophagus

## 2010-12-04 NOTE — ED Notes (Signed)
ZOX:[id number]<AV> Expected date:12/04/10<BR> Expected time: 2:47 PM<BR> Means of arrival:Ambulance<BR> Comments:<BR> [facility] -sensation of partial airway obstruction/stable

## 2010-12-04 NOTE — ED Notes (Signed)
Pt c/o having a piece of chicken in his throat, ate boneless chicken at 1330.  Unable to swallow anything, attempted to drink some water and throw back up. Reports it happened last week too. Pt denied sob, denied any pain, report discomfort. Pt alert, oriented, nad noted. Will continue to monitor. Unable to provide any information on the size of the chicken.

## 2010-12-04 NOTE — ED Notes (Signed)
MD at bedside. 

## 2010-12-04 NOTE — ED Notes (Signed)
Pt informed that he will be transfer to [facility] ED. [facility] called

## 2010-12-04 NOTE — ED Notes (Signed)
Per EMS pt was eating boneless chicken felt like he was being choked; went to the urgent care to be evaluated and they called EMS. This is not the first time that this has happened to him. A simular episode happened about a week ago which resolved itself. Pt A&Ox4, VSS.

## 2010-12-28 ENCOUNTER — Other Ambulatory Visit (HOSPITAL_COMMUNITY): Payer: Self-pay | Admitting: Gastroenterology

## 2010-12-29 ENCOUNTER — Ambulatory Visit (HOSPITAL_COMMUNITY)
Admission: RE | Admit: 2010-12-29 | Discharge: 2010-12-29 | Disposition: A | Discharge status: Home / Self Care | Payer: 59 | Source: Ambulatory Visit | Attending: Gastroenterology | Admitting: Gastroenterology

## 2010-12-29 DIAGNOSIS — R1310 Dysphagia, unspecified: Secondary | ICD-10-CM | POA: Insufficient documentation

## 2010-12-29 DIAGNOSIS — K449 Diaphragmatic hernia without obstruction or gangrene: Secondary | ICD-10-CM | POA: Insufficient documentation

## 2011-01-03 ENCOUNTER — Other Ambulatory Visit: Payer: Self-pay | Admitting: Gastroenterology

## 2011-01-03 ENCOUNTER — Ambulatory Visit (HOSPITAL_COMMUNITY)
Admission: RE | Admit: 2011-01-03 | Discharge: 2011-01-03 | Disposition: A | Discharge status: Home / Self Care | Payer: 59 | Source: Ambulatory Visit | Attending: Gastroenterology | Admitting: Gastroenterology

## 2011-01-03 ENCOUNTER — Encounter (HOSPITAL_COMMUNITY)
Admission: RE | Disposition: A | Discharge status: Home / Self Care | Payer: Self-pay | Source: Ambulatory Visit | Attending: Gastroenterology

## 2011-01-03 ENCOUNTER — Encounter (HOSPITAL_COMMUNITY): Payer: Self-pay | Admitting: *Deleted

## 2011-01-03 DIAGNOSIS — R109 Unspecified abdominal pain: Secondary | ICD-10-CM | POA: Insufficient documentation

## 2011-01-03 DIAGNOSIS — R1310 Dysphagia, unspecified: Secondary | ICD-10-CM | POA: Insufficient documentation

## 2011-01-03 DIAGNOSIS — K5100 Ulcerative (chronic) pancolitis without complications: Secondary | ICD-10-CM | POA: Insufficient documentation

## 2011-01-03 HISTORY — PX: ESOPHAGOGASTRODUODENOSCOPY: SHX5428

## 2011-01-03 SURGERY — EGD (ESOPHAGOGASTRODUODENOSCOPY)
Anesthesia: Moderate Sedation

## 2011-01-03 MED ORDER — FENTANYL CITRATE 0.05 MG/ML IJ SOLN
INTRAMUSCULAR | Status: AC
  Filled 2011-01-03: qty 2

## 2011-01-03 MED ORDER — SODIUM CHLORIDE 0.9 % IV SOLN
Freq: Once | INTRAVENOUS | Status: AC
  Administered 2011-01-03: 500 mL via INTRAVENOUS

## 2011-01-03 MED ORDER — MIDAZOLAM HCL 10 MG/2ML IJ SOLN
INTRAMUSCULAR | Status: AC
  Filled 2011-01-03: qty 2

## 2011-01-03 MED ORDER — BUTAMBEN-TETRACAINE-BENZOCAINE 2-2-14 % EX AERO
INHALATION_SPRAY | CUTANEOUS | Status: DC | PRN
  Administered 2011-01-03: 2 via TOPICAL

## 2011-01-03 MED ORDER — MIDAZOLAM HCL 10 MG/2ML IJ SOLN
INTRAMUSCULAR | Status: DC | PRN
  Administered 2011-01-03 (×5): 2 mg via INTRAVENOUS

## 2011-01-03 MED ORDER — FENTANYL CITRATE 0.05 MG/ML IJ SOLN
INTRAMUSCULAR | Status: DC | PRN
  Administered 2011-01-03 (×2): 50 ug via INTRAVENOUS

## 2011-01-03 NOTE — H&P (Signed)
Patient seen and reexamined.  No changes in interval history or physical examination since office consultation dated 12/28/2010 (which has been scanned into chart).

## 2011-01-03 NOTE — Procedures (Signed)
Procedure: Diagnostic esophagogastroduodenoscopy with esophageal biopsies  Indication: [name] is a 45 year old male born [birth year]. The patient has had 2 episodes of solid food esophageal dysphagia over the past 2 weeks. He denies odynophagia and chronic heartburn. He underwent a barium esophagram with barium tablet which was normal. The patient is scheduled to undergo a diagnostic esophagogastroduodenoscopy to evaluate esophageal dysphagia and rule out eosinophilic esophagitis.  Endoscopist: Danise Edge  Premedication: Versed 10 mg intravenously. Fentanyl 100 mcg intravenously. Cetacaine spray.  Procedure: The patient was placed in the left lateral decubitus position. The Pentax gastroscope was passed through the posterior hypopharynx into the proximal esophagus without difficulty. The hypopharynx, larynx, and vocal cords appeared normal.  Esophagoscopy: The proximal, mid, and lower segments of the esophageal mucosa appeared normal. The squamocolumnar junction is somewhat irregular and noted at 40 cm from the incisor teeth. Random esophageal biopsies were performed to look for eosinophilic esophagitis. Biopsies were performed at the esophagogastric junction to look for Barrett's esophagus.  Gastroscopy: Retroflex view of the gastric cardia and fundus was normal. The gastric body, antrum, and pylorus appeared normal.  Duodenoscopy: The duodenal bulb and descending duodenum appeared normal.  Assessment: Normal esophagogastroduodenoscopy.  Plan: Await biopsies to look for Barrett's esophagus and eosinophilic esophagitis.

## 2011-01-04 ENCOUNTER — Encounter (HOSPITAL_COMMUNITY): Payer: Self-pay

## 2011-01-04 ENCOUNTER — Encounter (HOSPITAL_COMMUNITY): Payer: Self-pay | Admitting: Gastroenterology

## 2011-02-23 IMAGING — CR DG CYSTOGRAM 3+V
1 series · 1 of 1 positions shown · non-contrast
Comparison: None.

CLINICAL DATA: Diverticular abscess.  Evaluate for fistula to
bladder

CYSTOGRAM - 3+ VIEW
TECHNIQUE: Routine using 225 ml water soluble contrast

[view not recorded]
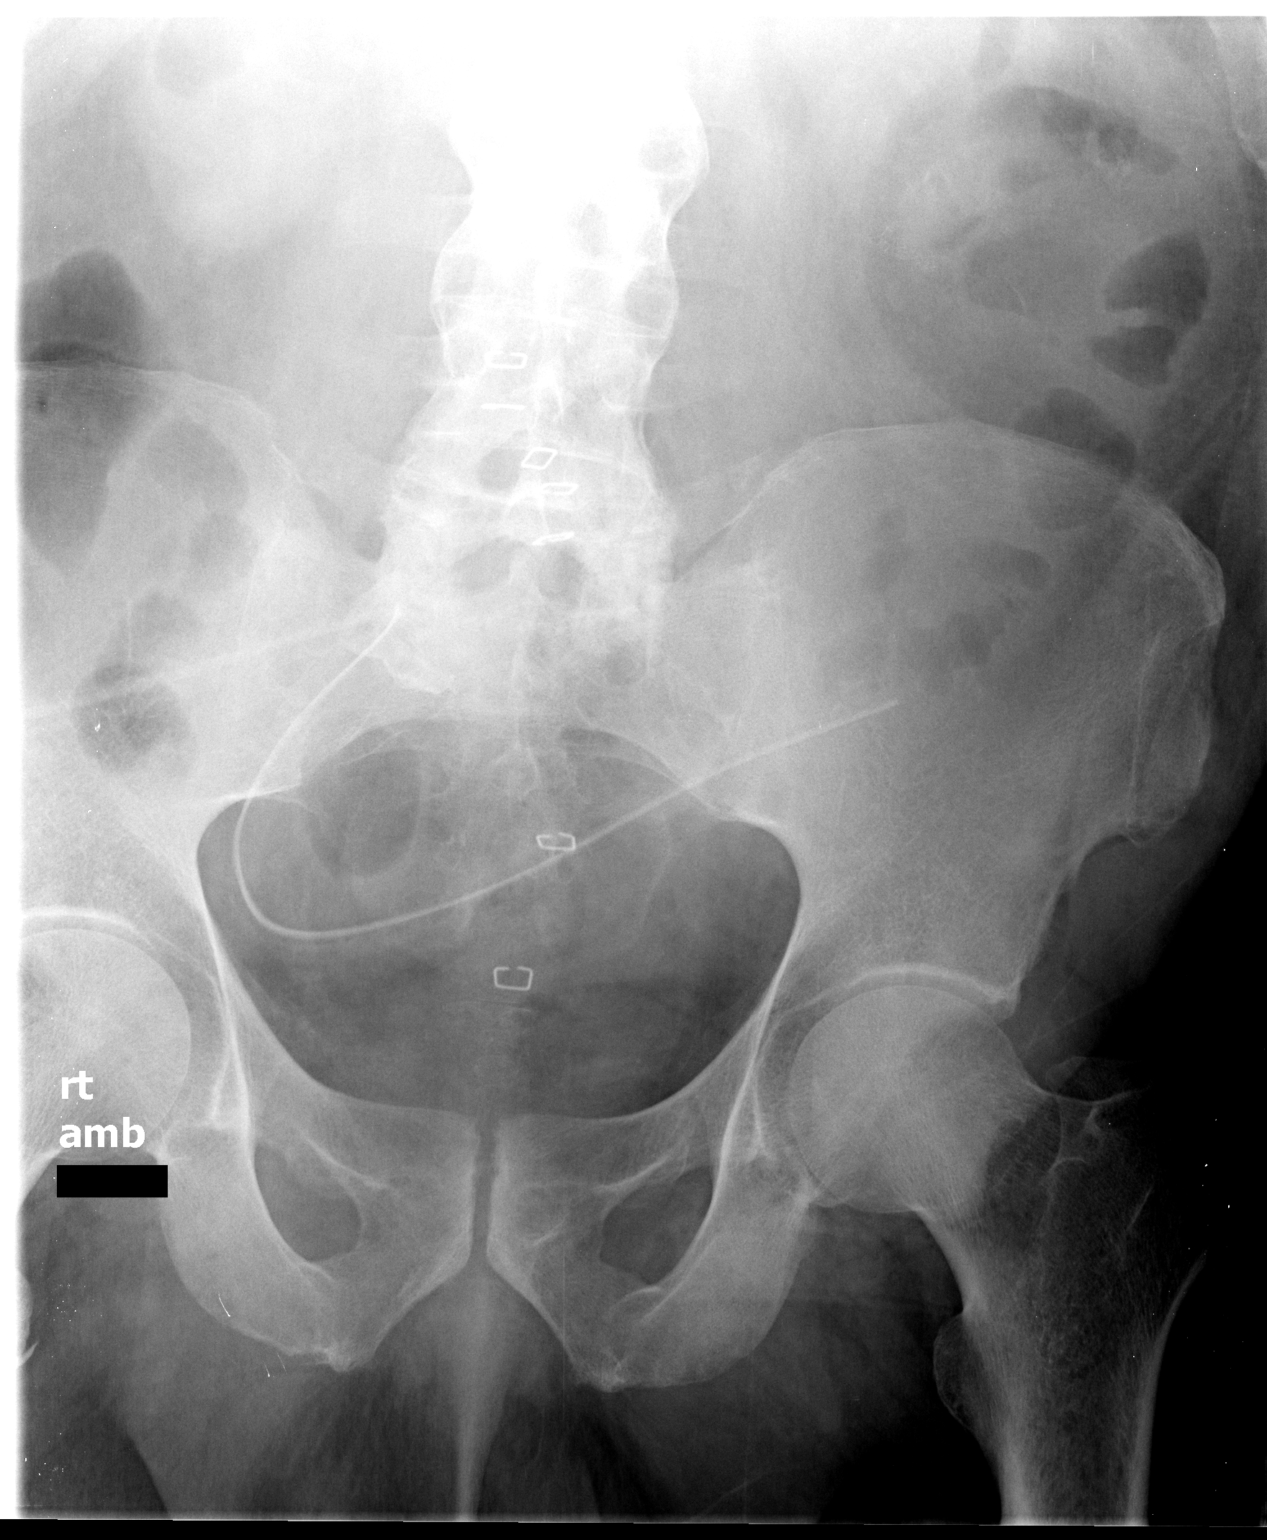

[1 of 1 positions shown; findings below may reference images not displayed]

FINDINGS: Scout view unremarkable except for a surgical drain in
the pelvis.  There also appear to be changes of ankylosing
spondylitis of the lumbar spine and SI joints.

Contrast was injected into the Foley catheter with intermittent
fluoroscopic monitoring.  An approximately 200 ml, the patient
began complaining of discomfort.  A total of approximately 225 ml
was instilled into the bladder.  There is no evidence for
fistulization from the bladder.  No reflux or filling defects.
IMPRESSION: No evidence for bladder fistula or acute findings.  There do appear
to be changes of ankylosing spondylitis of the lumbar spine and SI
joints.

## 2011-03-07 NOTE — Telephone Encounter (Signed)
See routing comments.

## 2012-10-03 ENCOUNTER — Other Ambulatory Visit: Payer: Self-pay | Admitting: Nurse Practitioner

## 2012-10-03 ENCOUNTER — Ambulatory Visit (HOSPITAL_COMMUNITY)
Admission: RE | Admit: 2012-10-03 | Discharge: 2012-10-03 | Disposition: A | Discharge status: Home / Self Care | Payer: BC Managed Care – HMO | Source: Ambulatory Visit | Attending: Internal Medicine | Admitting: Internal Medicine

## 2012-10-03 ENCOUNTER — Other Ambulatory Visit (HOSPITAL_COMMUNITY): Payer: Self-pay | Admitting: Internal Medicine

## 2012-10-03 DIAGNOSIS — M7989 Other specified soft tissue disorders: Secondary | ICD-10-CM

## 2012-10-03 DIAGNOSIS — M25562 Pain in left knee: Secondary | ICD-10-CM

## 2012-10-03 DIAGNOSIS — R609 Edema, unspecified: Secondary | ICD-10-CM

## 2012-10-03 DIAGNOSIS — R52 Pain, unspecified: Secondary | ICD-10-CM

## 2012-10-03 DIAGNOSIS — M25569 Pain in unspecified knee: Secondary | ICD-10-CM | POA: Insufficient documentation

## 2012-10-03 NOTE — Progress Notes (Signed)
Left lower extremity venous duplex completed.  Left:  No evidence of DVT or superficial thrombosis.  There appears to be a Baker's cyst.  Right:  Negative for DVT in the common femoral vein.  

## 2013-04-16 ENCOUNTER — Other Ambulatory Visit: Payer: Self-pay | Admitting: Gastroenterology

## 2016-03-22 ENCOUNTER — Other Ambulatory Visit: Payer: Self-pay | Admitting: Gastroenterology

## 2016-04-29 DIAGNOSIS — J019 Acute sinusitis, unspecified: Secondary | ICD-10-CM | POA: Diagnosis not present

## 2016-05-17 ENCOUNTER — Other Ambulatory Visit: Payer: Self-pay | Admitting: Gastroenterology

## 2016-05-21 ENCOUNTER — Encounter (HOSPITAL_COMMUNITY): Payer: Self-pay | Admitting: Anesthesiology

## 2016-05-21 ENCOUNTER — Ambulatory Visit (HOSPITAL_COMMUNITY)
Admission: RE | Admit: 2016-05-21 | Payer: BLUE CROSS/BLUE SHIELD | Source: Ambulatory Visit | Admitting: Gastroenterology

## 2016-05-21 SURGERY — COLONOSCOPY WITH PROPOFOL
Anesthesia: Monitor Anesthesia Care

## 2016-05-21 MED ORDER — PROPOFOL 10 MG/ML IV BOLUS
INTRAVENOUS | Status: AC
  Filled 2016-05-21: qty 40

## 2016-05-21 NOTE — Anesthesia Preprocedure Evaluation (Deleted)
Anesthesia Evaluation  Patient identified by MRN, date of birth, ID band Patient awake    Reviewed: Allergy & Precautions, NPO status , Patient's Chart, lab work & pertinent test results  Airway Mallampati: II  TM Distance: >3 FB Neck ROM: Full    Dental no notable dental hx.    Pulmonary neg pulmonary ROS,    Pulmonary exam normal breath sounds clear to auscultation       Cardiovascular hypertension, Normal cardiovascular exam Rhythm:Regular Rate:Normal     Neuro/Psych negative neurological ROS  negative psych ROS   GI/Hepatic negative GI ROS, Neg liver ROS,   Endo/Other  negative endocrine ROS  Renal/GU negative Renal ROS  negative genitourinary   Musculoskeletal negative musculoskeletal ROS (+)   Abdominal   Peds negative pediatric ROS (+)  Hematology negative hematology ROS (+)   Anesthesia Other Findings   Reproductive/Obstetrics negative OB ROS                             Anesthesia Physical Anesthesia Plan  ASA: II  Anesthesia Plan: MAC   Post-op Pain Management:    Induction: Intravenous  Airway Management Planned: Simple Face Mask  Additional Equipment:   Intra-op Plan:   Post-operative Plan:   Informed Consent: I have reviewed the patients History and Physical, chart, labs and discussed the procedure including the risks, benefits and alternatives for the proposed anesthesia with the patient or authorized representative who has indicated his/her understanding and acceptance.   Dental advisory given  Plan Discussed with: CRNA and Surgeon  Anesthesia Plan Comments:         Anesthesia Quick Evaluation  

## 2016-07-10 ENCOUNTER — Other Ambulatory Visit: Payer: Self-pay | Admitting: Gastroenterology

## 2016-07-11 ENCOUNTER — Ambulatory Visit (HOSPITAL_COMMUNITY): Payer: BLUE CROSS/BLUE SHIELD | Admitting: Anesthesiology

## 2016-07-11 ENCOUNTER — Ambulatory Visit (HOSPITAL_COMMUNITY): Admit: 2016-07-11 | Payer: BLUE CROSS/BLUE SHIELD | Admitting: Gastroenterology

## 2016-07-11 ENCOUNTER — Other Ambulatory Visit: Payer: Self-pay | Admitting: Gastroenterology

## 2016-07-11 ENCOUNTER — Encounter (HOSPITAL_COMMUNITY): Payer: Self-pay | Admitting: *Deleted

## 2016-07-11 ENCOUNTER — Encounter (HOSPITAL_COMMUNITY)
Admission: RE | Disposition: A | Discharge status: Home / Self Care | Payer: Self-pay | Source: Ambulatory Visit | Attending: Gastroenterology

## 2016-07-11 ENCOUNTER — Encounter (HOSPITAL_COMMUNITY): Payer: Self-pay

## 2016-07-11 ENCOUNTER — Ambulatory Visit (HOSPITAL_COMMUNITY)
Admission: RE | Admit: 2016-07-11 | Discharge: 2016-07-11 | Disposition: A | Discharge status: Home / Self Care | Payer: BLUE CROSS/BLUE SHIELD | Source: Ambulatory Visit | Attending: Gastroenterology | Admitting: Gastroenterology

## 2016-07-11 DIAGNOSIS — M109 Gout, unspecified: Secondary | ICD-10-CM | POA: Insufficient documentation

## 2016-07-11 DIAGNOSIS — K5100 Ulcerative (chronic) pancolitis without complications: Secondary | ICD-10-CM | POA: Diagnosis not present

## 2016-07-11 DIAGNOSIS — Z9049 Acquired absence of other specified parts of digestive tract: Secondary | ICD-10-CM | POA: Insufficient documentation

## 2016-07-11 DIAGNOSIS — K5190 Ulcerative colitis, unspecified, without complications: Secondary | ICD-10-CM | POA: Diagnosis not present

## 2016-07-11 DIAGNOSIS — D124 Benign neoplasm of descending colon: Secondary | ICD-10-CM | POA: Diagnosis not present

## 2016-07-11 DIAGNOSIS — Z1211 Encounter for screening for malignant neoplasm of colon: Secondary | ICD-10-CM | POA: Insufficient documentation

## 2016-07-11 DIAGNOSIS — K5140 Inflammatory polyps of colon without complications: Secondary | ICD-10-CM | POA: Diagnosis not present

## 2016-07-11 DIAGNOSIS — K635 Polyp of colon: Secondary | ICD-10-CM | POA: Diagnosis not present

## 2016-07-11 DIAGNOSIS — I10 Essential (primary) hypertension: Secondary | ICD-10-CM | POA: Diagnosis not present

## 2016-07-11 DIAGNOSIS — K6389 Other specified diseases of intestine: Secondary | ICD-10-CM | POA: Diagnosis not present

## 2016-07-11 HISTORY — PX: COLONOSCOPY WITH PROPOFOL: SHX5780

## 2016-07-11 SURGERY — COLONOSCOPY WITH PROPOFOL
Anesthesia: Monitor Anesthesia Care

## 2016-07-11 MED ORDER — LACTATED RINGERS IV SOLN
INTRAVENOUS | Status: DC
  Administered 2016-07-11: 1000 mL via INTRAVENOUS

## 2016-07-11 MED ORDER — PROPOFOL 10 MG/ML IV BOLUS
INTRAVENOUS | Status: DC | PRN
  Administered 2016-07-11 (×7): 20 mg via INTRAVENOUS
  Administered 2016-07-11: 40 mg via INTRAVENOUS
  Administered 2016-07-11 (×2): 20 mg via INTRAVENOUS
  Administered 2016-07-11: 40 mg via INTRAVENOUS
  Administered 2016-07-11 (×2): 20 mg via INTRAVENOUS

## 2016-07-11 MED ORDER — PROPOFOL 10 MG/ML IV BOLUS
INTRAVENOUS | Status: AC
  Filled 2016-07-11: qty 40

## 2016-07-11 SURGICAL SUPPLY — 21 items

## 2016-07-11 NOTE — Transfer of Care (Signed)
Immediate Anesthesia Transfer of Care Note  Patient: Austin Chang  Procedure(s) Performed: Procedure(s): COLONOSCOPY WITH PROPOFOL (N/A)  Patient Location: PACU and Endoscopy Unit  Anesthesia Type:MAC  Level of Consciousness: awake, alert , oriented and responds to stimulation  Airway & Oxygen Therapy: Patient Spontanous Breathing and Patient connected to face mask oxygen  Post-op Assessment: Report given to RN, Post -op Vital signs reviewed and stable and Patient moving all extremities  Post vital signs: Reviewed and stable  Last Vitals:  Vitals:   07/11/16 1200  BP: 128/74  Resp: (!) 24  Temp: 37.1 C    Last Pain:  Vitals:   07/11/16 1200  TempSrc: Oral         Complications: No apparent anesthesia complications

## 2016-07-11 NOTE — Anesthesia Postprocedure Evaluation (Signed)
Anesthesia Post Note  Patient: [name]  Procedure(s) Performed: Procedure(s) (LRB): COLONOSCOPY WITH PROPOFOL (N/A)     Patient location during evaluation: PACU Anesthesia Type: MAC Level of consciousness: awake and alert Pain management: pain level controlled Vital Signs Assessment: post-procedure vital signs reviewed and stable Respiratory status: spontaneous breathing, nonlabored ventilation and respiratory function stable Cardiovascular status: stable and blood pressure returned to baseline Anesthetic complications: no    Last Vitals:  Vitals:   07/11/16 1445 07/11/16 1450  BP:    Pulse: 93 95  Resp: 19 19  Temp:      Last Pain:  Vitals:   07/11/16 1432  TempSrc: Oral                 Lynda Rainwater

## 2016-07-11 NOTE — Op Note (Signed)
[facility] Patient Name: Austin Chang Procedure Date: 07/11/2016 MRN: [id number] Attending MD: Garlan Fair , MD Date of Birth: Apr 15, 1965 CSN: [id number] Age: 51 Admit Type: Outpatient Procedure:                Colonoscopy Indications:              High risk colon cancer surveillance: Ulcerative                            pancolitis diagnosed in 2002. Remote sigmoid                            colectomy to treat perforated diverticulitis. Providers:                Garlan Fair, MD, Cleda Daub, RN, Cherylynn Ridges, Technician, Dion Saucier, CRNA Referring MD:              Medicines:                Propofol per Anesthesia Complications:            No immediate complications. Estimated Blood Loss:     Estimated blood loss was minimal. Procedure:                Pre-Anesthesia Assessment:                           - Prior to the procedure, a History and Physical                            was performed, and patient medications and                            allergies were reviewed. The patient's tolerance of                            previous anesthesia was also reviewed. The risks                            and benefits of the procedure and the sedation                            options and risks were discussed with the patient.                            All questions were answered, and informed consent                            was obtained. Prior Anticoagulants: The patient has                            taken previous NSAID medication, last dose was 1  day prior to procedure. ASA Grade Assessment: II -                            A patient with mild systemic disease. After                            reviewing the risks and benefits, the patient was                            deemed in satisfactory condition to undergo the                            procedure.                           After obtaining  informed consent, the colonoscope                            was passed under direct vision. Throughout the                            procedure, the patient's blood pressure, pulse, and                            oxygen saturations were monitored continuously. The                            EC-[id number] ([id number]) scope was introduced through                            the anus and advanced to the the cecum, identified                            by appendiceal orifice and ileocecal valve. The                            colonoscopy was performed without difficulty. The                            patient tolerated the procedure well. The quality                            of the bowel preparation was good. The ileocecal                            valve, the appendiceal orifice and the rectum were                            photographed. Scope In: 1:49:10 PM Scope Out: 2:22:51 PM Scope Withdrawal Time: 0 hours 24 minutes 45 seconds  Total Procedure Duration: 0 hours 33 minutes 41 seconds  Findings:      The perianal and digital rectal examinations were normal.      A 7 mm polyp was found in the descending colon at  50 cm from the anal       verge. The polyp was sessile. The polyp was removed with a cold snare.       Resection and retrieval were complete. An endoclip was applied to the       polypectomy site.      The exam was otherwise without abnormality. Inactive ulcerative colitis.      Four biopsies were taken every 10 cm with a cold forceps from the cecum,       ascending colon, transverse colon, descending colon and rectum for       ulcerative colitis surveillance. These biopsy specimens were sent to       Pathology. Impression:               - One 7 mm polyp in the descending colon, removed                            with a cold snare. Resected and retrieved.                           - The examination was otherwise normal.                           - Biopsies for surveillance were  taken from the                            cecum, ascending colon, transverse colon,                            descending colon and rectum. Moderate Sedation:      N/A- Per Anesthesia Care Recommendation:           - Patient has a contact number available for                            emergencies. The signs and symptoms of potential                            delayed complications were discussed with the                            patient. Return to normal activities tomorrow.                            Written discharge instructions were provided to the                            patient.                           - Repeat colonoscopy date to be determined after                            pending pathology results are reviewed for                            surveillance.                           -  Resume previous diet.                           - Continue present medications. Procedure Code(s):        --- Professional ---                           [phone], Colonoscopy, flexible; with removal of                            tumor(s), polyp(s), or other lesion(s) by snare                            technique                           [id number], 48, Colonoscopy, flexible; with biopsy,                            single or multiple Diagnosis Code(s):        --- Professional ---                           K51.00, Ulcerative (chronic) pancolitis without                            complications                           D12.4, Benign neoplasm of descending colon CPT copyright 2016 American Medical Association. All rights reserved. The codes documented in this report are preliminary and upon coder review may  be revised to meet current compliance requirements. Earle Gell, MD Garlan Fair, MD 07/11/2016 2:33:05 PM This report has been signed electronically. Number of Addenda: 0

## 2016-07-11 NOTE — H&P (Signed)
Procedure: Universal ulcerative proctocolitis was diagnosed in 2002. Chronic subcutaneous Humira. 04/16/2013 surveillance colonoscopy showed mildly active universal ulcerative colitis without mucosal dysplasia. 01/03/2011 normal esophagogastroduodenoscopy with esophageal biopsies were performed. 01/13/2011 sigmoid colon stricture with pelvic abscess was diagnosed. Sigmoid colectomy with abscess drainage to treat perforated diverticulitis.  History: The patient is a 51 year old male born 10/29/1965. 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 He takes Humira subcutaneously every 2 weeks. He has undergone a sigmoid colectomy to treat perforated diverticulitis in the past. He is scheduled to undergo a repeat surveillance colonoscopy today.  Medication allergies: Septra  Past medical history: Universal ulcerative proctocolitis diagnosed in 2002. Ankylosing spondylitis. Gout. Hypertension. Cervical spinal stenosis. Perforated diverticulitis. Tonsillectomy. Cervical spine fusion. Sigmoid colectomy.  Exam: The patient is alert and lying comfortably on the endoscopy stretcher. Abdomen is soft and nontender to palpation. Lungs are clear to auscultation. Cardiac exam reveals a regular rhythm.  Plan: Proceed with surveillance colonoscopy

## 2016-07-11 NOTE — Discharge Instructions (Signed)

## 2016-07-11 NOTE — Transfer of Care (Signed)
Immediate Anesthesia Transfer of Care Note  Patient: Austin Chang  Procedure(s) Performed: Procedure(s): COLONOSCOPY WITH PROPOFOL (N/A)  Patient Location: PACU and Endoscopy Unit  Anesthesia Type:MAC  Level of Consciousness: awake, alert , oriented and patient cooperative  Airway & Oxygen Therapy: Patient Spontanous Breathing and Patient connected to face mask oxygen  Post-op Assessment: Report given to RN, Post -op Vital signs reviewed and stable and Patient moving all extremities  Post vital signs: Reviewed and stable  Last Vitals:  Vitals:   07/11/16 1200  BP: 128/74  Resp: (!) 24  Temp: 37.1 C    Last Pain:  Vitals:   07/11/16 1200  TempSrc: Oral         Complications: No apparent anesthesia complications

## 2016-07-11 NOTE — Anesthesia Preprocedure Evaluation (Signed)
Anesthesia Evaluation  Patient identified by MRN, date of birth, ID band Patient awake    Reviewed: Allergy & Precautions, NPO status , Patient's Chart, lab work & pertinent test results  Airway Mallampati: I       Dental no notable dental hx. (+) Teeth Intact   Pulmonary neg pulmonary ROS,    Pulmonary exam normal breath sounds clear to auscultation       Cardiovascular hypertension, Normal cardiovascular exam Rhythm:Regular Rate:Normal     Neuro/Psych negative neurological ROS  negative psych ROS   GI/Hepatic negative GI ROS, Neg liver ROS,   Endo/Other  negative endocrine ROS  Renal/GU negative Renal ROS  negative genitourinary   Musculoskeletal   Abdominal (+) + obese,   Peds  Hematology negative hematology ROS (+)   Anesthesia Other Findings   Reproductive/Obstetrics                             Anesthesia Physical Anesthesia Plan  ASA: II  Anesthesia Plan: MAC   Post-op Pain Management:    Induction:   PONV Risk Score and Plan: 1 and Ondansetron and Dexamethasone  Airway Management Planned: Natural Airway, Simple Face Mask and Nasal Cannula  Additional Equipment:   Intra-op Plan:   Post-operative Plan:   Informed Consent: I have reviewed the patients History and Physical, chart, labs and discussed the procedure including the risks, benefits and alternatives for the proposed anesthesia with the patient or authorized representative who has indicated his/her understanding and acceptance.     Plan Discussed with: CRNA and Surgeon  Anesthesia Plan Comments:         Anesthesia Quick Evaluation

## 2016-07-12 ENCOUNTER — Encounter (HOSPITAL_COMMUNITY): Payer: Self-pay | Admitting: Gastroenterology

## 2016-07-23 ENCOUNTER — Encounter: Payer: Self-pay | Admitting: Gastroenterology

## 2016-08-22 DIAGNOSIS — M1009 Idiopathic gout, multiple sites: Secondary | ICD-10-CM | POA: Diagnosis not present

## 2016-08-22 DIAGNOSIS — M2550 Pain in unspecified joint: Secondary | ICD-10-CM | POA: Diagnosis not present

## 2016-08-22 DIAGNOSIS — M459 Ankylosing spondylitis of unspecified sites in spine: Secondary | ICD-10-CM | POA: Diagnosis not present

## 2016-08-22 DIAGNOSIS — K5190 Ulcerative colitis, unspecified, without complications: Secondary | ICD-10-CM | POA: Diagnosis not present

## 2016-09-24 ENCOUNTER — Encounter (INDEPENDENT_AMBULATORY_CARE_PROVIDER_SITE_OTHER): Payer: Self-pay

## 2016-09-24 ENCOUNTER — Encounter: Payer: Self-pay | Admitting: Gastroenterology

## 2016-09-24 ENCOUNTER — Ambulatory Visit (INDEPENDENT_AMBULATORY_CARE_PROVIDER_SITE_OTHER): Payer: BLUE CROSS/BLUE SHIELD | Admitting: Gastroenterology

## 2016-09-24 VITALS — BP 104/78 | HR 100 | Ht 68.0 in | Wt 248.2 lb

## 2016-09-24 DIAGNOSIS — M459 Ankylosing spondylitis of unspecified sites in spine: Secondary | ICD-10-CM | POA: Diagnosis not present

## 2016-09-24 DIAGNOSIS — K5100 Ulcerative (chronic) pancolitis without complications: Secondary | ICD-10-CM | POA: Diagnosis not present

## 2016-09-24 DIAGNOSIS — H539 Unspecified visual disturbance: Secondary | ICD-10-CM

## 2016-09-24 NOTE — Progress Notes (Signed)
HPI :  51 y/o male with a history of colitis, HTN, arthritis, ankylosing spondylitis seen in consultation for Dr. Josetta Huddle and Earle Gell for management of colitis.   UC history - diagnosed in 2002. Treated with Humira, s/p sigmoid colectomy in 2012 for perforated diverticulitis Colonoscopy 07/11/2016 - 35mm descending colon polyp - inflammatory polyp, otherwise normal appearing colon - normal colon on biopsies with mild focal chronic changes in the recto sigmoid Colonoscopy 04/16/2013 - mildly active colitis without dysplasia EGD 01/03/2011 Colonoscopy 01/13/2011 - sigmoid colon stricture / pelvic abscess, perforated diverticulitis   He is currently taking Humira, has been on this for the past year or so. He thinks it is working well of for him. He is on it every 2 week dosing - working for his colon symptoms, and thinks working for his ank spon. He also has a history of gout. He previously has been on mesalamine (Apriso). He has never been on Remicade. No prior methotrexate use. No prior thiopurines.   He is having around 4-5 BMs per day, normal for him over time. No blood in the stools. No abdominal pains.  He has a history of joint pains in his legs - knees and ankles, hips, and neck - he thinks doing well at present.  He denies any history of iritis / uveitis. He has had some mild blurred vision in the past week. It has been 2-3 years since his last eye exam.   No FH of IBD or colon cancer. He is taking vitamin D 4000 IU / day  Healthcare maintenance: Pneumovacc - no result Flu shot - UTD TB testing - need results Vitamin D - need results  Past Medical History:  Diagnosis Date  . Ankylosing spondylitis ([place])   . Arthritis   . Colitis   . Colitis   . Diverticulosis   . Hypertension   . Obesity   . Rectal bleeding      Past Surgical History:  Procedure Laterality Date  . COLON SURGERY    . COLONOSCOPY WITH PROPOFOL N/A 07/11/2016   Procedure: COLONOSCOPY WITH  PROPOFOL;  Surgeon: Garlan Fair, MD;  Location: [facility];  Service: Endoscopy;  Laterality: N/A;  . COLOSTOMY CLOSURE    . ESOPHAGOGASTRODUODENOSCOPY  01/03/2011   Procedure: ESOPHAGOGASTRODUODENOSCOPY (EGD);  Surgeon: Garlan Fair, MD;  Location: [facility];  Service: Endoscopy;  Laterality: N/A;  . NECK SURGERY    . SPINE SURGERY    . TONSILLECTOMY     Family History  Problem Relation Age of Onset  . Heart disease Mother   . Heart disease Father   . Anesthesia problems Neg Hx   . Hypotension Neg Hx   . Malignant hyperthermia Neg Hx   . Pseudochol deficiency Neg Hx   . Colon cancer Neg Hx   . Rectal cancer Neg Hx   . Esophageal cancer Neg Hx   . Liver cancer Neg Hx    Social History  Substance Use Topics  . Smoking status: Never Smoker  . Smokeless tobacco: Never Used  . Alcohol use 2.5 oz/week    5 Standard drinks or equivalent per week   Current Outpatient Prescriptions  Medication Sig Dispense Refill  . acetaminophen (TYLENOL) 500 MG tablet Take 500 mg by mouth every 6 (six) hours as needed. For pain.     . Adalimumab (HUMIRA) 40 MG/0.8ML PSKT Inject 40 mg into the skin every 14 (fourteen) days.    Marland Kitchen allopurinol (ZYLOPRIM) 100 MG tablet Take  200 mg by mouth daily.    Marland Kitchen amLODipine-valsartan (EXFORGE) 5-320 MG tablet Take 1 tablet by mouth daily.    . Cholecalciferol (VITAMIN D) 2000 units CAPS Take 4,000 Units by mouth daily.    . indomethacin (INDOCIN SR) 75 MG CR capsule Take 75 mg by mouth daily as needed for mild pain. For inflammation.     No current facility-administered medications for this visit.    Allergies  Allergen Reactions  . Septra [Bactrim] Other (See Comments)    Very bad migraine     Review of Systems: All systems reviewed and negative except where noted in HPI.   Current labs not available  Physical Exam: BP 104/78   Pulse 100   Ht 5\' 8"  (1.727 m)   Wt 248 lb 4 oz (112.6 kg)   BMI 37.75 kg/m  Constitutional:  Pleasant,well-developed, male in no acute distress. HEENT: Normocephalic and atraumatic. Conjunctivae are normal. No scleral icterus. Neck supple.  Cardiovascular: Normal rate, regular rhythm.  Pulmonary/chest: Effort normal and breath sounds normal. No wheezing, rales or rhonchi. Abdominal: Soft, nondistended, nontender. There are no masses palpable. No hepatomegaly. Extremities: no edema Lymphadenopathy: No cervical adenopathy noted. Neurological: Alert and oriented to person place and time. Skin: Skin is warm and dry. No rashes noted. Psychiatric: Normal mood and affect. Behavior is normal.   ASSESSMENT AND PLAN: 51 year old male with ulcerative pancolitis and ankylosing spondylitis on chronic Humira therapy, here to establish care.  We discussed ulcerative colitis in general, long-term risks for dysplasia and colon cancer. Generally he has responded quite well to Humira, appears to be in clinical and endoscopic remission at this time. I would like to obtain his labs from primary care as well as his vaccination history, ensure he is up-to-date on his pneumococcal vaccine. He asked about shingles vaccine, I would hold off on that for now given his Humira use. I'll look to see him in clinic every 6-12 months. We'll plan a recall colonoscopy in 2020 per surveillance guidelines. If he has any flare of disease in the interim he can contact me. Otherwise for his visual changes or recommend he be seen by an ophthalmologist or optometrist to ensure no evidence of iritis or uveitis. He states he already has established and will follow up with them.  Woodward Cellar, MD [facility] Pager [phone]  CC: Josetta Huddle, MD

## 2016-09-24 NOTE — Patient Instructions (Signed)
You will be due for an office visit in 1 year.  You will be due for a colonoscopy in 06/2018.  We will request your records from Dr. Inda Merlin.   If you are age 53 or older, your body mass index should be between 23-30. Your Body mass index is 37.75 kg/m. If this is out of the aforementioned range listed, please consider follow up with your Primary Care Provider.  If you are age 71 or younger, your body mass index should be between 19-25. Your Body mass index is 37.75 kg/m. If this is out of the aformentioned range listed, please consider follow up with your Primary Care Provider.

## 2016-09-26 ENCOUNTER — Telehealth: Payer: Self-pay | Admitting: Gastroenterology

## 2016-09-26 NOTE — Telephone Encounter (Signed)
Results of old records arrived from Dr. Inda Merlin office:  - I don't see any record of pneumococcal vaccine. If that is the case, Austin Chang should have PCV13 vaccine, followed by PPSV23 2-12 months after the PCV13. Can you help coordinate for him?  - Austin Chang had a normal BMET and CBC done on 10/2015 - Austin Chang is due for basic labs now if this is his most recent lab. Would recommend CBC, CMET, vitamin D.  Thanks

## 2016-09-26 NOTE — Telephone Encounter (Signed)
Left message for patient to call back  

## 2016-09-27 ENCOUNTER — Other Ambulatory Visit: Payer: Self-pay

## 2016-09-27 DIAGNOSIS — K5100 Ulcerative (chronic) pancolitis without complications: Secondary | ICD-10-CM

## 2016-09-27 NOTE — Telephone Encounter (Signed)
I have not heard back from patient, mailed letter with recommendations. Labs are ordered in Epic. Asked patient to contact our office if he wishes to schedule pneumococcal vaccine or he may contact his PCP office to administer.

## 2016-09-28 ENCOUNTER — Telehealth: Payer: Self-pay

## 2016-09-28 ENCOUNTER — Other Ambulatory Visit: Payer: Self-pay

## 2016-09-28 NOTE — Telephone Encounter (Signed)
Spoke to patient about Dr. Doyne Keel review of records and recommendations. Patient will take letter mailed to him yesterday to his PCP office to get vaccines, he will come get lab work done here.

## 2016-11-06 DIAGNOSIS — J019 Acute sinusitis, unspecified: Secondary | ICD-10-CM | POA: Diagnosis not present

## 2016-12-29 DIAGNOSIS — K2970 Gastritis, unspecified, without bleeding: Secondary | ICD-10-CM | POA: Diagnosis not present

## 2017-01-09 DIAGNOSIS — R600 Localized edema: Secondary | ICD-10-CM | POA: Diagnosis not present

## 2017-01-11 DIAGNOSIS — R600 Localized edema: Secondary | ICD-10-CM | POA: Diagnosis not present

## 2017-09-03 ENCOUNTER — Encounter: Payer: Self-pay | Admitting: Gastroenterology

## 2018-03-05 DIAGNOSIS — K5190 Ulcerative colitis, unspecified, without complications: Secondary | ICD-10-CM | POA: Diagnosis not present

## 2018-03-05 DIAGNOSIS — M2550 Pain in unspecified joint: Secondary | ICD-10-CM | POA: Diagnosis not present

## 2018-03-05 DIAGNOSIS — M459 Ankylosing spondylitis of unspecified sites in spine: Secondary | ICD-10-CM | POA: Diagnosis not present

## 2018-03-05 DIAGNOSIS — M1009 Idiopathic gout, multiple sites: Secondary | ICD-10-CM | POA: Diagnosis not present

## 2018-08-06 DIAGNOSIS — R509 Fever, unspecified: Secondary | ICD-10-CM | POA: Diagnosis not present

## 2018-08-06 DIAGNOSIS — R197 Diarrhea, unspecified: Secondary | ICD-10-CM | POA: Diagnosis not present

## 2018-08-07 DIAGNOSIS — R197 Diarrhea, unspecified: Secondary | ICD-10-CM | POA: Diagnosis not present

## 2018-08-07 DIAGNOSIS — R509 Fever, unspecified: Secondary | ICD-10-CM | POA: Diagnosis not present

## 2018-09-05 DIAGNOSIS — M459 Ankylosing spondylitis of unspecified sites in spine: Secondary | ICD-10-CM | POA: Diagnosis not present

## 2018-09-05 DIAGNOSIS — M1009 Idiopathic gout, multiple sites: Secondary | ICD-10-CM | POA: Diagnosis not present

## 2018-11-06 DIAGNOSIS — K921 Melena: Secondary | ICD-10-CM | POA: Diagnosis not present

## 2018-11-06 DIAGNOSIS — T39395A Adverse effect of other nonsteroidal anti-inflammatory drugs [NSAID], initial encounter: Secondary | ICD-10-CM | POA: Diagnosis not present

## 2018-11-06 DIAGNOSIS — Z23 Encounter for immunization: Secondary | ICD-10-CM | POA: Diagnosis not present

## 2018-11-06 DIAGNOSIS — D509 Iron deficiency anemia, unspecified: Secondary | ICD-10-CM | POA: Diagnosis not present

## 2018-11-06 DIAGNOSIS — R109 Unspecified abdominal pain: Secondary | ICD-10-CM | POA: Diagnosis not present

## 2018-11-06 DIAGNOSIS — M109 Gout, unspecified: Secondary | ICD-10-CM | POA: Diagnosis not present

## 2018-11-06 DIAGNOSIS — Z8249 Family history of ischemic heart disease and other diseases of the circulatory system: Secondary | ICD-10-CM | POA: Diagnosis not present

## 2018-12-09 DIAGNOSIS — I10 Essential (primary) hypertension: Secondary | ICD-10-CM | POA: Diagnosis not present

## 2018-12-09 DIAGNOSIS — D509 Iron deficiency anemia, unspecified: Secondary | ICD-10-CM | POA: Diagnosis not present

## 2018-12-09 DIAGNOSIS — M109 Gout, unspecified: Secondary | ICD-10-CM | POA: Diagnosis not present

## 2018-12-09 DIAGNOSIS — K921 Melena: Secondary | ICD-10-CM | POA: Diagnosis not present

## 2018-12-15 DIAGNOSIS — K5100 Ulcerative (chronic) pancolitis without complications: Secondary | ICD-10-CM | POA: Diagnosis not present

## 2019-02-04 DIAGNOSIS — Z23 Encounter for immunization: Secondary | ICD-10-CM | POA: Diagnosis not present

## 2019-02-04 DIAGNOSIS — I10 Essential (primary) hypertension: Secondary | ICD-10-CM | POA: Diagnosis not present

## 2019-02-04 DIAGNOSIS — D509 Iron deficiency anemia, unspecified: Secondary | ICD-10-CM | POA: Diagnosis not present

## 2019-02-04 DIAGNOSIS — M109 Gout, unspecified: Secondary | ICD-10-CM | POA: Diagnosis not present

## 2019-02-04 DIAGNOSIS — E559 Vitamin D deficiency, unspecified: Secondary | ICD-10-CM | POA: Diagnosis not present

## 2019-02-04 DIAGNOSIS — M459 Ankylosing spondylitis of unspecified sites in spine: Secondary | ICD-10-CM | POA: Diagnosis not present

## 2019-02-04 DIAGNOSIS — K5100 Ulcerative (chronic) pancolitis without complications: Secondary | ICD-10-CM | POA: Diagnosis not present

## 2019-02-04 DIAGNOSIS — Z0000 Encounter for general adult medical examination without abnormal findings: Secondary | ICD-10-CM | POA: Diagnosis not present

## 2019-02-04 DIAGNOSIS — Z8249 Family history of ischemic heart disease and other diseases of the circulatory system: Secondary | ICD-10-CM | POA: Diagnosis not present

## 2019-02-04 DIAGNOSIS — Z125 Encounter for screening for malignant neoplasm of prostate: Secondary | ICD-10-CM | POA: Diagnosis not present

## 2019-02-15 DIAGNOSIS — J069 Acute upper respiratory infection, unspecified: Secondary | ICD-10-CM | POA: Diagnosis not present

## 2019-04-08 DIAGNOSIS — I10 Essential (primary) hypertension: Secondary | ICD-10-CM | POA: Diagnosis not present

## 2019-06-04 DIAGNOSIS — M1009 Idiopathic gout, multiple sites: Secondary | ICD-10-CM | POA: Diagnosis not present

## 2019-06-04 DIAGNOSIS — M2550 Pain in unspecified joint: Secondary | ICD-10-CM | POA: Diagnosis not present

## 2019-06-04 DIAGNOSIS — M459 Ankylosing spondylitis of unspecified sites in spine: Secondary | ICD-10-CM | POA: Diagnosis not present

## 2019-06-04 DIAGNOSIS — K5190 Ulcerative colitis, unspecified, without complications: Secondary | ICD-10-CM | POA: Diagnosis not present

## 2019-08-05 DIAGNOSIS — Z23 Encounter for immunization: Secondary | ICD-10-CM | POA: Diagnosis not present

## 2019-09-09 DIAGNOSIS — K5289 Other specified noninfective gastroenteritis and colitis: Secondary | ICD-10-CM | POA: Diagnosis not present

## 2019-09-09 DIAGNOSIS — D125 Benign neoplasm of sigmoid colon: Secondary | ICD-10-CM | POA: Diagnosis not present

## 2019-09-09 DIAGNOSIS — K5100 Ulcerative (chronic) pancolitis without complications: Secondary | ICD-10-CM | POA: Diagnosis not present

## 2019-12-07 DIAGNOSIS — M459 Ankylosing spondylitis of unspecified sites in spine: Secondary | ICD-10-CM | POA: Diagnosis not present

## 2019-12-07 DIAGNOSIS — K5190 Ulcerative colitis, unspecified, without complications: Secondary | ICD-10-CM | POA: Diagnosis not present

## 2019-12-07 DIAGNOSIS — Z111 Encounter for screening for respiratory tuberculosis: Secondary | ICD-10-CM | POA: Diagnosis not present

## 2019-12-07 DIAGNOSIS — M2550 Pain in unspecified joint: Secondary | ICD-10-CM | POA: Diagnosis not present

## 2019-12-07 DIAGNOSIS — M1009 Idiopathic gout, multiple sites: Secondary | ICD-10-CM | POA: Diagnosis not present

## 2020-01-28 DIAGNOSIS — R197 Diarrhea, unspecified: Secondary | ICD-10-CM | POA: Diagnosis not present

## 2020-01-28 DIAGNOSIS — R112 Nausea with vomiting, unspecified: Secondary | ICD-10-CM | POA: Diagnosis not present

## 2020-01-30 ENCOUNTER — Other Ambulatory Visit: Payer: Self-pay

## 2020-01-30 ENCOUNTER — Emergency Department (HOSPITAL_BASED_OUTPATIENT_CLINIC_OR_DEPARTMENT_OTHER): Payer: BC Managed Care – PPO

## 2020-01-30 ENCOUNTER — Inpatient Hospital Stay (HOSPITAL_BASED_OUTPATIENT_CLINIC_OR_DEPARTMENT_OTHER)
Admission: EM | Admit: 2020-01-30 | Discharge: 2020-02-02 | DRG: 177 | Disposition: A | Discharge status: Home / Self Care | Payer: BC Managed Care – PPO | Attending: Internal Medicine | Admitting: Internal Medicine

## 2020-01-30 DIAGNOSIS — Z6836 Body mass index (BMI) 36.0-36.9, adult: Secondary | ICD-10-CM | POA: Diagnosis not present

## 2020-01-30 DIAGNOSIS — J159 Unspecified bacterial pneumonia: Secondary | ICD-10-CM | POA: Diagnosis not present

## 2020-01-30 DIAGNOSIS — J9601 Acute respiratory failure with hypoxia: Secondary | ICD-10-CM

## 2020-01-30 DIAGNOSIS — E871 Hypo-osmolality and hyponatremia: Secondary | ICD-10-CM | POA: Diagnosis not present

## 2020-01-30 DIAGNOSIS — R0902 Hypoxemia: Secondary | ICD-10-CM

## 2020-01-30 DIAGNOSIS — I10 Essential (primary) hypertension: Secondary | ICD-10-CM

## 2020-01-30 DIAGNOSIS — D849 Immunodeficiency, unspecified: Secondary | ICD-10-CM | POA: Diagnosis present

## 2020-01-30 DIAGNOSIS — J1282 Pneumonia due to coronavirus disease 2019: Secondary | ICD-10-CM | POA: Diagnosis present

## 2020-01-30 DIAGNOSIS — J189 Pneumonia, unspecified organism: Secondary | ICD-10-CM | POA: Diagnosis not present

## 2020-01-30 DIAGNOSIS — U071 COVID-19: Secondary | ICD-10-CM | POA: Diagnosis not present

## 2020-01-30 DIAGNOSIS — Z79899 Other long term (current) drug therapy: Secondary | ICD-10-CM | POA: Diagnosis not present

## 2020-01-30 DIAGNOSIS — E669 Obesity, unspecified: Secondary | ICD-10-CM | POA: Diagnosis present

## 2020-01-30 DIAGNOSIS — I517 Cardiomegaly: Secondary | ICD-10-CM | POA: Diagnosis not present

## 2020-01-30 DIAGNOSIS — E861 Hypovolemia: Secondary | ICD-10-CM | POA: Diagnosis not present

## 2020-01-30 DIAGNOSIS — K529 Noninfective gastroenteritis and colitis, unspecified: Secondary | ICD-10-CM | POA: Diagnosis not present

## 2020-01-30 DIAGNOSIS — R5383 Other fatigue: Secondary | ICD-10-CM | POA: Diagnosis not present

## 2020-01-30 DIAGNOSIS — R000 Tachycardia, unspecified: Secondary | ICD-10-CM | POA: Diagnosis not present

## 2020-01-30 DIAGNOSIS — R0602 Shortness of breath: Secondary | ICD-10-CM | POA: Diagnosis not present

## 2020-01-30 LAB — CBC WITH DIFFERENTIAL/PLATELET
Abs Immature Granulocytes: 0.02 10*3/uL (ref 0.00–0.07)
Basophils Absolute: 0 10*3/uL (ref 0.0–0.1)
Basophils Relative: 0 %
Eosinophils Absolute: 0 10*3/uL (ref 0.0–0.5)
Eosinophils Relative: 1 %
HCT: 39 % (ref 39.0–52.0)
Hemoglobin: 14.1 g/dL (ref 13.0–17.0)
Immature Granulocytes: 0 %
Lymphocytes Relative: 10 %
Lymphs Abs: 0.5 10*3/uL — ABNORMAL LOW (ref 0.7–4.0)
MCH: 33.6 pg (ref 26.0–34.0)
MCHC: 36.2 g/dL — ABNORMAL HIGH (ref 30.0–36.0)
MCV: 92.9 fL (ref 80.0–100.0)
Monocytes Absolute: 0.9 10*3/uL (ref 0.1–1.0)
Monocytes Relative: 16 %
Neutro Abs: 4.2 10*3/uL (ref 1.7–7.7)
Neutrophils Relative %: 73 %
Platelets: 214 10*3/uL (ref 150–400)
RBC: 4.2 MIL/uL — ABNORMAL LOW (ref 4.22–5.81)
RDW: 12 % (ref 11.5–15.5)
WBC: 5.6 10*3/uL (ref 4.0–10.5)
nRBC: 0 % (ref 0.0–0.2)

## 2020-01-30 LAB — FERRITIN: Ferritin: 1421 ng/mL — ABNORMAL HIGH (ref 24–336)

## 2020-01-30 LAB — COMPREHENSIVE METABOLIC PANEL
ALT: 48 U/L — ABNORMAL HIGH (ref 0–44)
AST: 60 U/L — ABNORMAL HIGH (ref 15–41)
Albumin: 3.1 g/dL — ABNORMAL LOW (ref 3.5–5.0)
Alkaline Phosphatase: 125 U/L (ref 38–126)
Anion gap: 11 (ref 5–15)
BUN: 5 mg/dL — ABNORMAL LOW (ref 6–20)
CO2: 25 mmol/L (ref 22–32)
Calcium: 8.5 mg/dL — ABNORMAL LOW (ref 8.9–10.3)
Chloride: 84 mmol/L — ABNORMAL LOW (ref 98–111)
Creatinine, Ser: 0.96 mg/dL (ref 0.61–1.24)
GFR, Estimated: >60 mL/min (ref >60)
Glucose, Bld: 122 mg/dL — ABNORMAL HIGH (ref 70–99)
Potassium: 4.2 mmol/L (ref 3.5–5.1)
Sodium: 120 mmol/L — ABNORMAL LOW (ref 135–145)
Total Bilirubin: 0.9 mg/dL (ref 0.3–1.2)
Total Protein: 6.8 g/dL (ref 6.5–8.1)

## 2020-01-30 LAB — LACTIC ACID, PLASMA: Lactic Acid, Venous: 1.1 mmol/L (ref 0.5–1.9)

## 2020-01-30 LAB — C-REACTIVE PROTEIN: CRP: 23.1 mg/dL — ABNORMAL HIGH (ref <1.0)

## 2020-01-30 LAB — FIBRINOGEN: Fibrinogen: 678 mg/dL — ABNORMAL HIGH (ref 210–475)

## 2020-01-30 LAB — LACTATE DEHYDROGENASE: LDH: 239 U/L — ABNORMAL HIGH (ref 98–192)

## 2020-01-30 LAB — TRIGLYCERIDES: Triglycerides: 116 mg/dL (ref <150)

## 2020-01-30 LAB — RESP PANEL BY RT-PCR (FLU A&B, COVID) ARPGX2
Influenza A by PCR: NEGATIVE
Influenza B by PCR: NEGATIVE
SARS Coronavirus 2 by RT PCR: POSITIVE — AB

## 2020-01-30 LAB — PROCALCITONIN: Procalcitonin: 6.34 ng/mL

## 2020-01-30 LAB — D-DIMER, QUANTITATIVE: D-Dimer, Quant: 1.2 ug/mL-FEU — ABNORMAL HIGH (ref 0.00–0.50)

## 2020-01-30 MED ORDER — ACETAMINOPHEN 500 MG PO TABS
500.0000 mg | ORAL_TABLET | Freq: Once | ORAL | Status: AC
  Administered 2020-01-30: 500 mg via ORAL
  Filled 2020-01-30: qty 1

## 2020-01-30 MED ORDER — SODIUM CHLORIDE 0.9 % IV BOLUS
1000.0000 mL | Freq: Once | INTRAVENOUS | Status: AC
  Administered 2020-01-30: 1000 mL via INTRAVENOUS

## 2020-01-30 MED ORDER — DEXAMETHASONE SODIUM PHOSPHATE 10 MG/ML IJ SOLN
6.0000 mg | Freq: Once | INTRAMUSCULAR | Status: AC
  Administered 2020-01-30: 6 mg via INTRAVENOUS
  Filled 2020-01-30: qty 1

## 2020-01-30 MED ORDER — SODIUM CHLORIDE 0.9 % IV SOLN
100.0000 mg | INTRAVENOUS | Status: AC
  Administered 2020-01-30 (×2): 100 mg via INTRAVENOUS

## 2020-01-30 MED ORDER — ALBUTEROL SULFATE HFA 108 (90 BASE) MCG/ACT IN AERS
8.0000 | INHALATION_SPRAY | Freq: Once | RESPIRATORY_TRACT | Status: AC
  Administered 2020-01-30: 8 via RESPIRATORY_TRACT
  Filled 2020-01-30: qty 6.7

## 2020-01-30 MED ORDER — SODIUM CHLORIDE 0.9 % IV SOLN
100.0000 mg | Freq: Every day | INTRAVENOUS | Status: DC
  Administered 2020-01-31 – 2020-02-02 (×3): 100 mg via INTRAVENOUS
  Filled 2020-01-30 (×2): qty 20

## 2020-01-30 MED ORDER — SODIUM CHLORIDE 0.9 % IV SOLN
INTRAVENOUS | Status: DC | PRN
  Administered 2020-01-30: 250 mL via INTRAVENOUS

## 2020-01-30 NOTE — ED Notes (Signed)
Assisted pt to use urinal; brief noted to be dry.

## 2020-01-30 NOTE — ED Triage Notes (Signed)
SOB, fatigue since yesterday.

## 2020-01-30 NOTE — ED Notes (Signed)
O2 decreased to 4L[place] 

## 2020-01-30 NOTE — ED Notes (Signed)
O2 increased to 6L [place]

## 2020-01-30 NOTE — ED Notes (Signed)
Pt was incontinent of urine, while trying to use what he thought was a urinal (it was a cup of water; pt and bed cleaned and changed.

## 2020-01-30 NOTE — Progress Notes (Signed)
RT in to pt room for desaturation to 78%. Upon arrival pt said he had fell asleep and must have taken everything off. BP cuff and O2 not on currently. Pt placed back on 6L nasal cannula and Pt recovered to 88-91%. No distress noted, pt only complaint at this time is back pain. BP cuff placed back on pt left arm. Pt educated on proning and the benefits it provides. PT verbalizes understanding and said he is unsure if he can prone. Pt encouraged to sleep on his side and verbalizes understanding. RT will continue to monitor and be available as needed.

## 2020-01-30 NOTE — ED Provider Notes (Signed)
[facility] EMERGENCY DEPARTMENT Provider Note   CSN: [id number] Arrival date & time: 01/30/20  1218     History Chief Complaint  Patient presents with  . Shortness of Breath    Austin Chang is a 55 y.o. male with PMHx HTN and ankylosing spondylitis on Humira who presents to the ED today with complaint of gradual onset, constant, worsening, shortness of breath that began last night. Pt also complains of fevers, fatigue, cough, diarrhea, nausea, 1 episode of NBNB emesis. He reports his daughter is having similar symptoms and awaiting her COVID test results. Pt is vaccinated x 2 with last dose in April. He has been taking Tylenol and last took 500 mg Tylenol just prior to arrival. Pt denies headache, chest pain, syncope, hemoptysis, abdominal pain, or any other associated symptoms.   The history is provided by the patient, medical records and the spouse.       Past Medical History:  Diagnosis Date  . Ankylosing spondylitis ([place])   . Arthritis   . Colitis   . Colitis   . Diverticulosis   . Hypertension   . Obesity   . Rectal bleeding     Patient Active Problem List   Diagnosis Date Noted  . Pneumonia due to COVID-19 virus 01/30/2020    Past Surgical History:  Procedure Laterality Date  . COLON SURGERY    . COLONOSCOPY WITH PROPOFOL N/A 07/11/2016   Procedure: COLONOSCOPY WITH PROPOFOL;  Surgeon: Garlan Fair, MD;  Location: [facility];  Service: Endoscopy;  Laterality: N/A;  . COLOSTOMY CLOSURE    . ESOPHAGOGASTRODUODENOSCOPY  01/03/2011   Procedure: ESOPHAGOGASTRODUODENOSCOPY (EGD);  Surgeon: Garlan Fair, MD;  Location: [facility];  Service: Endoscopy;  Laterality: N/A;  . NECK SURGERY    . SPINE SURGERY    . TONSILLECTOMY         Family History  Problem Relation Age of Onset  . Heart disease Mother   . Heart disease Father   . Anesthesia problems Neg Hx   . Hypotension Neg Hx   . Malignant hyperthermia Neg Hx   . Pseudochol deficiency  Neg Hx   . Colon cancer Neg Hx   . Rectal cancer Neg Hx   . Esophageal cancer Neg Hx   . Liver cancer Neg Hx     Social History   Tobacco Use  . Smoking status: Never Smoker  . Smokeless tobacco: Never Used  Substance Use Topics  . Alcohol use: Yes    Alcohol/week: 5.0 standard drinks    Types: 5 Standard drinks or equivalent per week  . Drug use: No    Home Medications Prior to Admission medications   Medication Sig Start Date End Date Taking? Authorizing Provider  acetaminophen (TYLENOL) 500 MG tablet Take 500 mg by mouth every 6 (six) hours as needed. For pain.     [provider]  Adalimumab (HUMIRA) 40 MG/0.8ML PSKT Inject 40 mg into the skin every 14 (fourteen) days.    [provider]  allopurinol (ZYLOPRIM) 100 MG tablet Take 200 mg by mouth daily.    [provider]  amLODipine-valsartan (EXFORGE) 5-320 MG tablet Take 1 tablet by mouth daily.    [provider]  Cholecalciferol (VITAMIN D) 2000 units CAPS Take 4,000 Units by mouth daily.    [provider]  indomethacin (INDOCIN SR) 75 MG CR capsule Take 75 mg by mouth daily as needed for mild pain. For inflammation.    [provider]  Allergies    Septra [bactrim]  Review of Systems   Review of Systems  Constitutional: Positive for chills, fatigue and fever.  Respiratory: Positive for cough and shortness of breath.   Cardiovascular: Negative for chest pain.  Gastrointestinal: Positive for diarrhea, nausea and vomiting. Negative for abdominal pain.  Musculoskeletal: Positive for myalgias.  All other systems reviewed and are negative.   Physical Exam Updated Vital Signs BP 121/76 (BP Location: Left Arm)   Pulse (!) 126   Temp (!) 103.3 F (39.6 C) (Oral)   Resp (!) 32   SpO2 (!) 89%   Physical Exam Vitals and nursing note reviewed.  Constitutional:      Appearance: He is obese. He is ill-appearing. He is not diaphoretic.  HENT:     Head:  Normocephalic and atraumatic.  Eyes:     Conjunctiva/sclera: Conjunctivae normal.  Cardiovascular:     Rate and Rhythm: Regular rhythm. Tachycardia present.     Pulses: Normal pulses.  Pulmonary:     Effort: Tachypnea present.     Breath sounds: Decreased breath sounds present. No wheezing, rhonchi or rales.     Comments: Currently on 4L [place] satting 94%. Able to speak in short sentences. Decreased air movement throughout all lung fields.  Abdominal:     Palpations: Abdomen is soft.     Tenderness: There is no abdominal tenderness. There is no guarding or rebound.  Musculoskeletal:     Cervical back: Neck supple.     Right lower leg: No edema.     Left lower leg: No edema.  Skin:    General: Skin is warm and dry.  Neurological:     Mental Status: He is alert.     ED Results / Procedures / Treatments   Labs (all labs ordered are listed, but only abnormal results are displayed) Labs Reviewed  RESP PANEL BY RT-PCR (FLU A&B, COVID) ARPGX2 - Abnormal; Notable for the following components:      Result Value   SARS Coronavirus 2 by RT PCR POSITIVE (*)    All other components within normal limits  CBC WITH DIFFERENTIAL/PLATELET - Abnormal; Notable for the following components:   RBC 4.20 (*)    MCHC 36.2 (*)    Lymphs Abs 0.5 (*)    All other components within normal limits  COMPREHENSIVE METABOLIC PANEL - Abnormal; Notable for the following components:   Sodium 120 (*)    Chloride 84 (*)    Glucose, Bld 122 (*)    BUN 5 (*)    Calcium 8.5 (*)    Albumin 3.1 (*)    AST 60 (*)    ALT 48 (*)    All other components within normal limits  D-DIMER, QUANTITATIVE (NOT AT [facility]) - Abnormal; Notable for the following components:   D-Dimer, Quant 1.20 (*)    All other components within normal limits  CULTURE, BLOOD (ROUTINE X 2)  CULTURE, BLOOD (ROUTINE X 2)  LACTIC ACID, PLASMA  LACTIC ACID, PLASMA  C-REACTIVE PROTEIN  FERRITIN  FIBRINOGEN  LACTATE DEHYDROGENASE  PROCALCITONIN   TRIGLYCERIDES    EKG EKG Interpretation  Date/Time:  Saturday January 30 2020 13:07:28 EST Ventricular Rate:  115 PR Interval:    QRS Duration: 97 QT Interval:  316 QTC Calculation: 437 R Axis:   90 Text Interpretation: Sinus tachycardia Borderline right axis deviation Borderline low voltage, extremity leads increased rate from prior 2/12 Confirmed by Aletta Edouard [phone]) on 01/30/2020 1:11:29 PM   Radiology DG Chest  Port 1 View  Result Date: 01/30/2020 CLINICAL DATA:  Shortness of breath, fatigue EXAM: PORTABLE CHEST 1 VIEW COMPARISON:  08/04/2010 FINDINGS: Heart size is mildly enlarged. There are patchy airspace opacities most pronounced within the bilateral lung bases and periphery of the mid lung zones. No pleural effusion or pneumothorax. IMPRESSION: 1. Patchy bilateral airspace opacities favored to represent multifocal atypical/viral pneumonia. 2. Mild cardiomegaly. Electronically Signed   By: Davina Poke D.O.   On: 01/30/2020 14:22    Procedures .Critical Care Performed by: Eustaquio Maize, PA-C Authorized by: Eustaquio Maize, PA-C   Critical care provider statement:    Critical care time (minutes):  45   Critical care was necessary to treat or prevent imminent or life-threatening deterioration of the following conditions:  Respiratory failure (COVID, hypoxia)   Critical care was time spent personally by me on the following activities:  Discussions with consultants, evaluation of patient's response to treatment, examination of patient, ordering and performing treatments and interventions, ordering and review of laboratory studies, ordering and review of radiographic studies, pulse oximetry, re-evaluation of patient's condition, obtaining history from patient or surrogate and review of old charts   (including critical care time)  Medications Ordered in ED Medications  sodium chloride 0.9 % bolus 1,000 mL (has no administration in time range)  dexamethasone (DECADRON)  injection 6 mg (has no administration in time range)  remdesivir 100 mg in sodium chloride 0.9 % 100 mL IVPB (has no administration in time range)  remdesivir 100 mg in sodium chloride 0.9 % 100 mL IVPB (has no administration in time range)  acetaminophen (TYLENOL) tablet 500 mg (500 mg Oral Given 01/30/20 1330)  albuterol (VENTOLIN HFA) 108 (90 Base) MCG/ACT inhaler 8 puff (8 puffs Inhalation Given 01/30/20 1335)    ED Course  I have reviewed the triage vital signs and the nursing notes.  Pertinent labs & imaging results that were available during my care of the patient were reviewed by me and considered in my medical decision making (see chart for details).  Clinical Course as of 01/30/20 1505  Sat Jan 30, 7452  3912 55 year old male Covid vaccinated here with few days of increased shortness of breath.  Multiple family members sick.  He is febrile here and requiring oxygen.  Tachycardic and tachypneic.  Getting labs EKG Covid testing.  Will need admission to the hospital. [MB]    Clinical Course User Index [MB] Hayden Rasmussen, MD   MDM Rules/Calculators/A&P                          55 year old male who presents to the ED today complaining of cough, shortness of breath, fever, chills, fatigue, diarrhea, nausea, vomiting for the past 2 days, worsening today.  Vaccinated x2 doses, most recent being in April of this year.  No booster.  On arrival to the ED patient is febrile at 103.3, tachycardic at 126, tachypneic at 32.  He is hypoxic with a O2 sat of 70% on room air, placed on 4 L with increased to 93%.  Not typically on oxygen.  Family member is awaiting Covid test results with symptoms.  This is highest on my differential today.  Patient will need to be admitted for hypoxia.  Tylenol provided for fever, took 500 mg prior to arrival however will give additional 500 mg.  We will plan to swab for Covid, preadmission Covid orders, EKG, chest x-ray.  Given his hypoxia I have lower suspicion for  typical sepsis however if Covid test comes back negative will give empiric antibiotics to cover for infection.  Main source would likely be pulmonary at this point given cough and shortness of breath.  EKG with sinus tachycardia. No acute ischemic changes.  CXR with findings concerning for COVID pneumonia CBC without leukocytosis. Hgb stable at 14.1 CMP with sodium 120, chloride 84. Will provide small amount of fluids. Creatinine stable at 0.96 and BUN 5.  D dimer elevated at 1.20  COVID has returned positive at this time. Will provide remdesivir and decadron. Will call for admission.   Discussed case with [facility] Dr. Louanne Belton who agrees to accept patient for admission.   This note was prepared using Dragon voice recognition software and may include unintentional dictation errors due to the inherent limitations of voice recognition software.  DANFORD TAT was evaluated in Emergency Department on 01/30/2020 for the symptoms described in the history of present illness. He was evaluated in the context of the global COVID-19 pandemic, which necessitated consideration that the patient might be at risk for infection with the SARS-CoV-2 virus that causes COVID-19. Institutional protocols and algorithms that pertain to the evaluation of patients at risk for COVID-19 are in a state of rapid change based on information released by regulatory bodies including the CDC and federal and state organizations. These policies and algorithms were followed during the patient's care in the ED.  Final Clinical Impression(s) / ED Diagnoses Final diagnoses:  COVID-19  Hypoxia    Rx / DC Orders ED Discharge Orders    None       Eustaquio Maize, PA-C 01/30/20 1505    Hayden Rasmussen, MD 01/30/20 1742

## 2020-01-30 NOTE — ED Notes (Signed)
Pt incontinent of urine again while trying to use urinal; pt and bed cleaned and changed; adult brief applied.

## 2020-01-31 DIAGNOSIS — U071 COVID-19: Secondary | ICD-10-CM | POA: Diagnosis present

## 2020-01-31 DIAGNOSIS — K529 Noninfective gastroenteritis and colitis, unspecified: Secondary | ICD-10-CM | POA: Diagnosis present

## 2020-01-31 DIAGNOSIS — D849 Immunodeficiency, unspecified: Secondary | ICD-10-CM | POA: Diagnosis present

## 2020-01-31 DIAGNOSIS — Z79899 Other long term (current) drug therapy: Secondary | ICD-10-CM | POA: Diagnosis not present

## 2020-01-31 DIAGNOSIS — I10 Essential (primary) hypertension: Secondary | ICD-10-CM | POA: Diagnosis present

## 2020-01-31 DIAGNOSIS — Z6836 Body mass index (BMI) 36.0-36.9, adult: Secondary | ICD-10-CM | POA: Diagnosis not present

## 2020-01-31 DIAGNOSIS — E861 Hypovolemia: Secondary | ICD-10-CM | POA: Diagnosis present

## 2020-01-31 DIAGNOSIS — J1282 Pneumonia due to coronavirus disease 2019: Secondary | ICD-10-CM | POA: Diagnosis present

## 2020-01-31 DIAGNOSIS — J9601 Acute respiratory failure with hypoxia: Secondary | ICD-10-CM

## 2020-01-31 DIAGNOSIS — E669 Obesity, unspecified: Secondary | ICD-10-CM | POA: Diagnosis present

## 2020-01-31 DIAGNOSIS — J159 Unspecified bacterial pneumonia: Secondary | ICD-10-CM | POA: Diagnosis present

## 2020-01-31 DIAGNOSIS — E871 Hypo-osmolality and hyponatremia: Secondary | ICD-10-CM

## 2020-01-31 DIAGNOSIS — R0902 Hypoxemia: Secondary | ICD-10-CM | POA: Diagnosis present

## 2020-01-31 MED ORDER — AZITHROMYCIN 250 MG PO TABS
250.0000 mg | ORAL_TABLET | Freq: Every day | ORAL | Status: DC
  Administered 2020-02-01 – 2020-02-02 (×2): 250 mg via ORAL
  Filled 2020-01-31 (×2): qty 1

## 2020-01-31 MED ORDER — ONDANSETRON HCL 4 MG PO TABS: 4.0000 mg | ORAL_TABLET | Freq: Four times a day (QID) | ORAL | Status: DC | PRN

## 2020-01-31 MED ORDER — ACETAMINOPHEN 325 MG PO TABS: 650.0000 mg | ORAL_TABLET | Freq: Four times a day (QID) | ORAL | Status: DC | PRN

## 2020-01-31 MED ORDER — ALBUTEROL SULFATE HFA 108 (90 BASE) MCG/ACT IN AERS
1.0000 | INHALATION_SPRAY | RESPIRATORY_TRACT | Status: DC | PRN
  Administered 2020-01-31: 2 via RESPIRATORY_TRACT

## 2020-01-31 MED ORDER — IPRATROPIUM-ALBUTEROL 20-100 MCG/ACT IN AERS: 1.0000 | INHALATION_SPRAY | Freq: Four times a day (QID) | RESPIRATORY_TRACT | Status: DC | PRN

## 2020-01-31 MED ORDER — ONDANSETRON HCL 4 MG/2ML IJ SOLN: 4.0000 mg | Freq: Four times a day (QID) | INTRAMUSCULAR | Status: DC | PRN

## 2020-01-31 MED ORDER — SODIUM CHLORIDE 0.9 % IV SOLN
INTRAVENOUS | Status: DC | PRN
  Administered 2020-01-31: 500 mL via INTRAVENOUS

## 2020-01-31 MED ORDER — AZITHROMYCIN 250 MG PO TABS
500.0000 mg | ORAL_TABLET | Freq: Every day | ORAL | Status: AC
  Administered 2020-01-31: 500 mg via ORAL
  Filled 2020-01-31: qty 2

## 2020-01-31 MED ORDER — GUAIFENESIN-DM 100-10 MG/5ML PO SYRP
10.0000 mL | ORAL_SOLUTION | ORAL | Status: DC | PRN
  Administered 2020-01-31: 10 mL via ORAL
  Filled 2020-01-31 (×2): qty 10

## 2020-01-31 MED ORDER — DEXAMETHASONE SODIUM PHOSPHATE 10 MG/ML IJ SOLN
6.0000 mg | INTRAMUSCULAR | Status: DC
  Administered 2020-02-01 – 2020-02-02 (×2): 6 mg via INTRAVENOUS
  Filled 2020-01-31 (×2): qty 1

## 2020-01-31 MED ORDER — LIP MEDEX EX OINT
TOPICAL_OINTMENT | CUTANEOUS | Status: DC | PRN
  Filled 2020-01-31: qty 7

## 2020-01-31 MED ORDER — SODIUM CHLORIDE 0.9 % IV SOLN
1.0000 g | INTRAVENOUS | Status: DC
  Administered 2020-01-31 – 2020-02-01 (×2): 1 g via INTRAVENOUS
  Filled 2020-01-31 (×2): qty 1

## 2020-01-31 MED ORDER — ENOXAPARIN SODIUM 40 MG/0.4ML ~~LOC~~ SOLN
40.0000 mg | SUBCUTANEOUS | Status: DC
  Administered 2020-01-31: 40 mg via SUBCUTANEOUS
  Filled 2020-01-31: qty 0.4

## 2020-01-31 NOTE — ED Notes (Signed)
Attempted to give report twice; RN unavailable

## 2020-01-31 NOTE — ED Notes (Signed)
Changed pulse ox probe. Reports shob, increased O2 [place] to 6L to attempt to relieve - pt does reports some relief of shortness of breath, RR remains 30.

## 2020-01-31 NOTE — H&P (Signed)
History and Physical  Patient Name: Austin Chang     WNU:[id number]    DOB: January 28, 1966    DOA: 01/30/2020 PCP: Marden Noble, MD  Patient coming from: Home  Chief Complaint: General malaise, viral symptoms   HPI: Austin Chang is a 55 y.o. male with hx of colitis (on Humira), diverticulosis, hypertension, obesity, who presented to the hospital on 01/30/2020 secondary to dyspnea and general malaise, was found to be Covid positive.  Patient states for about a week he has been feeling unwell.  He has had associated general malaise, nausea, vomiting once today, abdominal pain, and diarrhea.  Initially went to his PCP when symptoms started and he was found to be negative.  He was given some medication to help with his symptoms that initially helped somewhat however he started to get progressive shortness of breath and more lethargic.  He has been sleeping a lot more.  His wife told him he looked bad.  He then was evaluated at outside facility and he said they told him he was going to be admitted.  He does not have any respiratory issues at baseline, does not use inhalers or oxygen.  He received 2 of the vaccines and was planning to get his booster soon.  ED course: -On initial presentation, patient febrile, temperature 39.6 C, tachycardic, rate 126, tachypneic, respiratory rate 32, O2 sats 78% on room air. -Relevant labs on initial presentation: Sodium 120, potassium 4.6, chloride 84, bicarb 25, glucose 122, BUN 5, creatinine 0.96, pro-Cal 6.34, WBC 5.6, hemoglobin 14, ferritin 1400, CRP 23, LDH 239 -He was given Decadron, remdesivir, breathing treatments, IV fluids, and started on O2 supplementation with pending transfer to [facility] -At the time my exam, when he arrived to [facility], he says he feels better but again not feeling great.     ROS: A complete and thorough review systems obtained, negative unless stated in the HPI.      Past Medical History:  Diagnosis Date   . Ankylosing spondylitis (HCC)   . Arthritis   . Colitis   . Colitis   . Diverticulosis   . Hypertension   . Obesity   . Rectal bleeding     Past Surgical History:  Procedure Laterality Date  . COLON SURGERY    . COLONOSCOPY WITH PROPOFOL N/A 07/11/2016   Procedure: COLONOSCOPY WITH PROPOFOL;  Surgeon: Charolett Bumpers, MD;  Location: [facility];  Service: Endoscopy;  Laterality: N/A;  . COLOSTOMY CLOSURE    . ESOPHAGOGASTRODUODENOSCOPY  01/03/2011   Procedure: ESOPHAGOGASTRODUODENOSCOPY (EGD);  Surgeon: Charolett Bumpers, MD;  Location: [facility];  Service: Endoscopy;  Laterality: N/A;  . NECK SURGERY    . SPINE SURGERY    . TONSILLECTOMY      Social History: Patient lives at home with family.  The patient walks without assistance.  Non-smoker.  Allergies  Allergen Reactions  . Septra [Bactrim] Other (See Comments)    Very bad migraine    Family history: family history includes Heart disease in his father and mother.  Prior to Admission medications   Medication Sig Start Date End Date Taking? Authorizing Provider  acetaminophen (TYLENOL) 500 MG tablet Take 500 mg by mouth every 6 (six) hours as needed. For pain.     [provider]  allopurinol (ZYLOPRIM) 300 MG tablet Take by mouth. 11/04/19   [provider]  amLODipine-valsartan (EXFORGE) 10-160 MG tablet TAKE 1 TABLET BY MOUTH EVERY DAY FOR BLOOD PRESSURE  [provider]  Cholecalciferol (VITAMIN D) 2000 units CAPS Take 4,000 Units by mouth daily.    [provider]  Colchicine 0.6 MG CAPS Take 1 capsule by mouth daily. 08/24/19   [provider]  HUMIRA PEN 40 MG/0.4ML PNKT SMARTSIG:40 Milligram(s) SUB-Q Every 2 Weeks 11/25/19   [provider]  indomethacin (INDOCIN SR) 75 MG CR capsule Take 75 mg by mouth daily as needed for mild pain. For inflammation.    [provider]  probenecid (BENEMID) 500 MG tablet Take 500 mg by mouth 2 (two) times daily.  11/22/19   [provider]  zolpidem (AMBIEN) 10 MG tablet Take 10 mg by mouth at bedtime as needed. 01/20/20   [provider]       Physical Exam: BP 121/73 (BP Location: Left Arm)   Pulse 96   Temp 98 F (36.7 C) (Oral)   Resp (!) 24   Ht 5\' 10"  (1.778 m)   Wt 115.5 kg   SpO2 95%   BMI 36.55 kg/m  General appearance: Well-developed, adult male, alert and in mild respiratory distress with talking.   Eyes: Anicteric, conjunctiva pink, lids and lashes normal. PERRL.    ENT: No nasal deformity, discharge, epistaxis.  Hearing intact.  Neck: No neck masses.  Trachea midline.  No thyromegaly/tenderness. Lymph: No cervical or supraclavicular lymphadenopathy. Skin: Warm and dry.  No jaundice.  No suspicious rashes or lesions. Cardiac: RRR, nl S1-S2, no murmurs appreciated.. Trace LE edema.  Radial and pedal pulses 2+ and symmetric. Respiratory: Diminished breath sounds bilaterally, no significant wheezing noted Abdomen: Abdomen soft.  Nontender, bowel sounds present.  No ascites, distension, hepatosplenomegaly.   MSK: No deformities or effusions of the large joints of the upper or lower extremities bilaterally.  No cyanosis or clubbing. Neuro: Cranial nerves 2 through 12 grossly intact.  Sensation intact to light touch. Speech is fluent.  Muscle strength intact.    Psych: Sensorium intact and responding to questions, attention normal.  Behavior appropriate.  Affect within normal limits.  Judgment and insight appear normal.     Labs on Admission:  I have personally reviewed following labs and imaging studies: CBC: Recent Labs  Lab 01/30/20 1319  WBC 5.6  NEUTROABS 4.2  HGB 14.1  HCT 39.0  MCV 92.9  PLT [id number]   Basic Metabolic Panel: Recent Labs  Lab 01/30/20 1407  NA 120*  K 4.2  CL 84*  CO2 25  GLUCOSE 122*  BUN 5*  CREATININE 0.96  CALCIUM 8.5*   GFR: Estimated Creatinine Clearance: 109.4 mL/min (by C-G formula based on SCr of 0.96  mg/dL).  Liver Function Tests: Recent Labs  Lab 01/30/20 1407  AST 60*  ALT 48*  ALKPHOS 125  BILITOT 0.9  PROT 6.8  ALBUMIN 3.1*   No results for input(s): LIPASE, AMYLASE in the last 168 hours. No results for input(s): AMMONIA in the last 168 hours. Coagulation Profile: No results for input(s): INR, PROTIME in the last 168 hours. Cardiac Enzymes: No results for input(s): CKTOTAL, CKMB, CKMBINDEX, TROPONINI in the last 168 hours. BNP (last 3 results) No results for input(s): PROBNP in the last 8760 hours. HbA1C: No results for input(s): HGBA1C in the last 72 hours. CBG: No results for input(s): GLUCAP in the last 168 hours. Lipid Profile: Recent Labs    01/30/20 1407  TRIG 116   Thyroid Function Tests: No results for input(s): TSH, T4TOTAL, FREET4, T3FREE, THYROIDAB in the last 72 hours. Anemia Panel: Recent  Labs    01/30/20 1319  FERRITIN 1,421*     Recent Results (from the past 240 hour(s))  Resp Panel by RT-PCR (Flu A&B, Covid) Nasopharyngeal Swab     Status: Abnormal   Collection Time: 01/30/20  1:19 PM   Specimen: Nasopharyngeal Swab; Nasopharyngeal(NP) swabs in vial transport medium  Result Value Ref Range Status   SARS Coronavirus 2 by RT PCR POSITIVE (A) NEGATIVE Final    Comment: RESULT CALLED TO, READ BACK BY AND VERIFIED WITH: Josph Macho RN [id number] PHILLIPS C (NOTE) SARS-CoV-2 target nucleic acids are DETECTED.  The SARS-CoV-2 RNA is generally detectable in upper respiratory specimens during the acute phase of infection. Positive results are indicative of the presence of the identified virus, but do not rule out bacterial infection or co-infection with other pathogens not detected by the test. Clinical correlation with patient history and other diagnostic information is necessary to determine patient infection status. The expected result is Negative.  Fact Sheet for Patients: EntrepreneurPulse.com.au  Fact Sheet for  Healthcare Providers: IncredibleEmployment.be  This test is not yet approved or cleared by the [place] FDA and  has been authorized for detection and/or diagnosis of SARS-CoV-2 by FDA under an Emergency Use Authorization (EUA).  This EUA will remain in effect (meaning this test can be  used) for the duration of  the COVID-19 declaration under Section 564(b)(1) of the Act, 21 U.S.C. section 360bbb-3(b)(1), unless the authorization is terminated or revoked sooner.     Influenza A by PCR NEGATIVE NEGATIVE Final   Influenza B by PCR NEGATIVE NEGATIVE Final    Comment: (NOTE) The Xpert Xpress SARS-CoV-2/FLU/RSV plus assay is intended as an aid in the diagnosis of influenza from Nasopharyngeal swab specimens and should not be used as a sole basis for treatment. Nasal washings and aspirates are unacceptable for Xpert Xpress SARS-CoV-2/FLU/RSV testing.  Fact Sheet for Patients: EntrepreneurPulse.com.au  Fact Sheet for Healthcare Providers: IncredibleEmployment.be  This test is not yet approved or cleared by the [place] FDA and has been authorized for detection and/or diagnosis of SARS-CoV-2 by FDA under an Emergency Use Authorization (EUA). This EUA will remain in effect (meaning this test can be used) for the duration of the COVID-19 declaration under Section 564(b)(1) of the Act, 21 U.S.C. section 360bbb-3(b)(1), unless the authorization is terminated or revoked.  Performed at [facility], [address] [place], [place] [address]   Blood Culture (routine x 2)     Status: None (Preliminary result)   Collection Time: 01/30/20  1:19 PM   Specimen: BLOOD  Result Value Ref Range Status   Specimen Description   Final    BLOOD RIGHT ARM Performed at [facility], [address] [place], [place] [address]    Special Requests   Final    BOTTLES DRAWN AEROBIC AND ANAEROBIC Blood Culture adequate  volume Performed at [facility], [address] [place], [place] [address]    Culture   Final    NO GROWTH < 24 HOURS Performed at [facility], [place] [address] [place], [place] [address]    Report Status PENDING  Incomplete  Blood Culture (routine x 2)     Status: None (Preliminary result)   Collection Time: 01/30/20  1:37 PM   Specimen: BLOOD RIGHT HAND  Result Value Ref Range Status   Specimen Description   Final    BLOOD RIGHT HAND Performed at [facility],  [place], [place] [address]    Special Requests   Final    BOTTLES DRAWN AEROBIC AND ANAEROBIC Blood Culture results may not be optimal due to an inadequate volume of blood received in culture bottles Performed at [facility], [address] [place], [place] [address]    Culture   Final    NO GROWTH < 24 HOURS Performed at [facility], [place] [address] [place], [place] [address]    Report Status PENDING  Incomplete           Radiological Exams on Admission: Personally reviewed: Consistent with multifocal inflammation infection DG Chest Port 1 View  Result Date: 01/30/2020 CLINICAL DATA:  Shortness of breath, fatigue EXAM: PORTABLE CHEST 1 VIEW COMPARISON:  08/04/2010 FINDINGS: Heart size is mildly enlarged. There are patchy airspace opacities most pronounced within the bilateral lung bases and periphery of the mid lung zones. No pleural effusion or pneumothorax. IMPRESSION: 1. Patchy bilateral airspace opacities favored to represent multifocal atypical/viral pneumonia. 2. Mild cardiomegaly. Electronically Signed   By: Davina Poke D.O.   On: 01/30/2020 14:22       Assessment/Plan   1.  Acute hypoxic respiratory failure secondary to Covid pneumonitis and/or possibly bacterial pneumonia -Patient saturations 78% on room air -Covid positive on 01/30/2020 -Chest x-ray on admission consistent with multifocal pneumonia -Continue remdesivir,  Decadron -breathing treatments and antitussives as needed -Given procalcitonin 6, significant O2 requirement, and being immunocompromised, will start on azithromycin and Rocephin for coverage of community-acquired pneumonia -Wean O2 as tolerated -Trend inflammatory markers  2.  Covid pneumonitis -Covid positive on 01/31/2020 -See further plans above  3.  Hyponatremia -Sodium 120 on initial presentation -Suspect secondary to poor solute p.o. intake, and diarrhea -Repeat BMP ordered -Urinalysis, urine sodium, urine osmolality, serum osmolality ordered -Strict I's and O's -Serial BMP monitoring -Encourage solute/solid food intake   4.  Essential hypertension -We will hold off on restarting home BP meds as blood pressure is well controlled and waiting medication reconciliation   5.  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   What exists of the patient's chart was reviewed in depth and summarized above.  Clinical condition: Watcher      Doran Heater [facility] Please page though [place] or Epic secure chat:  For password, contact charge nurse

## 2020-01-31 NOTE — ED Notes (Signed)
Pt declined this RN calling anyone to update on transfer; pt reports he will call them on his cellphone

## 2020-01-31 NOTE — ED Notes (Signed)
Remains confused regarding what day it is - aware to self, location, holiday, just cannot remember what day of the week it is.

## 2020-01-31 NOTE — Progress Notes (Signed)
Patient arrived to the unit via stretcher, A&O, and able to make needs known. Will continue to monitor the patient.

## 2020-02-01 ENCOUNTER — Encounter (HOSPITAL_COMMUNITY): Payer: Self-pay | Admitting: Internal Medicine

## 2020-02-01 DIAGNOSIS — J1282 Pneumonia due to coronavirus disease 2019: Secondary | ICD-10-CM | POA: Diagnosis not present

## 2020-02-01 DIAGNOSIS — U071 COVID-19: Secondary | ICD-10-CM | POA: Diagnosis not present

## 2020-02-01 LAB — CBC WITH DIFFERENTIAL/PLATELET
Abs Immature Granulocytes: 0.01 10*3/uL (ref 0.00–0.07)
Basophils Absolute: 0 10*3/uL (ref 0.0–0.1)
Basophils Relative: 0 %
Eosinophils Absolute: 0 10*3/uL (ref 0.0–0.5)
Eosinophils Relative: 0 %
HCT: 35.2 % — ABNORMAL LOW (ref 39.0–52.0)
Hemoglobin: 12.4 g/dL — ABNORMAL LOW (ref 13.0–17.0)
Immature Granulocytes: 0 %
Lymphocytes Relative: 8 %
Lymphs Abs: 0.6 10*3/uL — ABNORMAL LOW (ref 0.7–4.0)
MCH: 34 pg (ref 26.0–34.0)
MCHC: 35.2 g/dL (ref 30.0–36.0)
MCV: 96.4 fL (ref 80.0–100.0)
Monocytes Absolute: 0.9 10*3/uL (ref 0.1–1.0)
Monocytes Relative: 12 %
Neutro Abs: 5.9 10*3/uL (ref 1.7–7.7)
Neutrophils Relative %: 80 %
Platelets: 315 10*3/uL (ref 150–400)
RBC: 3.65 MIL/uL — ABNORMAL LOW (ref 4.22–5.81)
RDW: 12.2 % (ref 11.5–15.5)
WBC: 7.4 10*3/uL (ref 4.0–10.5)
nRBC: 0 % (ref 0.0–0.2)

## 2020-02-01 LAB — URINALYSIS, ROUTINE W REFLEX MICROSCOPIC
Bilirubin Urine: NEGATIVE
Glucose, UA: 150 mg/dL — AB
Hgb urine dipstick: NEGATIVE
Ketones, ur: NEGATIVE mg/dL
Leukocytes,Ua: NEGATIVE
Nitrite: NEGATIVE
Protein, ur: NEGATIVE mg/dL
Specific Gravity, Urine: 1.012 (ref 1.005–1.030)
pH: 6 (ref 5.0–8.0)

## 2020-02-01 LAB — COMPREHENSIVE METABOLIC PANEL
ALT: 33 U/L (ref 0–44)
AST: 26 U/L (ref 15–41)
Albumin: 2.7 g/dL — ABNORMAL LOW (ref 3.5–5.0)
Alkaline Phosphatase: 96 U/L (ref 38–126)
Anion gap: 10 (ref 5–15)
BUN: 12 mg/dL (ref 6–20)
CO2: 27 mmol/L (ref 22–32)
Calcium: 8.4 mg/dL — ABNORMAL LOW (ref 8.9–10.3)
Chloride: 95 mmol/L — ABNORMAL LOW (ref 98–111)
Creatinine, Ser: 0.78 mg/dL (ref 0.61–1.24)
GFR, Estimated: >60 mL/min (ref >60)
Glucose, Bld: 186 mg/dL — ABNORMAL HIGH (ref 70–99)
Potassium: 4.3 mmol/L (ref 3.5–5.1)
Sodium: 132 mmol/L — ABNORMAL LOW (ref 135–145)
Total Bilirubin: 0.5 mg/dL (ref 0.3–1.2)
Total Protein: 5.9 g/dL — ABNORMAL LOW (ref 6.5–8.1)

## 2020-02-01 LAB — C-REACTIVE PROTEIN: CRP: 12.3 mg/dL — ABNORMAL HIGH (ref <1.0)

## 2020-02-01 LAB — HIV ANTIBODY (ROUTINE TESTING W REFLEX): HIV Screen 4th Generation wRfx: NONREACTIVE

## 2020-02-01 LAB — D-DIMER, QUANTITATIVE: D-Dimer, Quant: 3.04 ug/mL-FEU — ABNORMAL HIGH (ref 0.00–0.50)

## 2020-02-01 LAB — OSMOLALITY, URINE: Osmolality, Ur: 378 mOsm/kg (ref 300–900)

## 2020-02-01 LAB — PHOSPHORUS: Phosphorus: 2 mg/dL — ABNORMAL LOW (ref 2.5–4.6)

## 2020-02-01 LAB — OSMOLALITY: Osmolality: 281 mOsm/kg (ref 275–295)

## 2020-02-01 LAB — MAGNESIUM: Magnesium: 2 mg/dL (ref 1.7–2.4)

## 2020-02-01 LAB — SODIUM, URINE, RANDOM: Sodium, Ur: <10 mmol/L

## 2020-02-01 LAB — FERRITIN: Ferritin: 906 ng/mL — ABNORMAL HIGH (ref 24–336)

## 2020-02-01 MED ORDER — ENOXAPARIN SODIUM 60 MG/0.6ML ~~LOC~~ SOLN
60.0000 mg | SUBCUTANEOUS | Status: DC
  Administered 2020-02-01: 60 mg via SUBCUTANEOUS
  Filled 2020-02-01 (×2): qty 0.6

## 2020-02-01 MED ORDER — GUAIFENESIN-DM 100-10 MG/5ML PO SYRP
5.0000 mL | ORAL_SOLUTION | ORAL | Status: DC | PRN
  Administered 2020-02-01: 5 mL via ORAL
  Filled 2020-02-01: qty 10

## 2020-02-01 MED ORDER — ZOLPIDEM TARTRATE 5 MG PO TABS
10.0000 mg | ORAL_TABLET | Freq: Every evening | ORAL | Status: DC | PRN
  Administered 2020-02-01: 5 mg via ORAL
  Filled 2020-02-01: qty 2

## 2020-02-01 MED ORDER — HYDROCOD POLST-CPM POLST ER 10-8 MG/5ML PO SUER
5.0000 mL | Freq: Two times a day (BID) | ORAL | Status: AC | PRN
  Administered 2020-02-01 (×2): 5 mL via ORAL
  Filled 2020-02-01 (×2): qty 5

## 2020-02-01 MED ORDER — POLYVINYL ALCOHOL 1.4 % OP SOLN
2.0000 [drp] | OPHTHALMIC | Status: DC | PRN
  Filled 2020-02-01: qty 15

## 2020-02-01 NOTE — Progress Notes (Signed)
PHARMACY - PHYSICIAN COMMUNICATION CRITICAL VALUE ALERT - BLOOD CULTURE IDENTIFICATION (BCID)  ATHEN RIEL is an 54 y.o. male who presented to [facility] on 01/30/2020 with a chief complaint of  Shortness of breath & fatigue.   Assessment:  +COVID PNA.  Afebrile, WBC WNL.  Aerobic bottle of 1 blood cx set now growing GPR.  No BCID.  Likely contaminant.   Name of physician (or Provider) Contacted: Avon Gully  Current antibiotics: Rocephin + Zithromax  Changes to prescribed antibiotics recommended:  Patient is on recommended antibiotics - No changes needed  No results found for this or any previous visit.  Netta Cedars PharmD 02/01/2020  6:06 PM

## 2020-02-01 NOTE — Progress Notes (Signed)
Patient stood at bedside for approximately 5 minutes and while on room air his oxygen levels decreased to 85%. Once back in bed patient quickly recovered and is now sating around 90-91%. Will continue to monitor.

## 2020-02-01 NOTE — Progress Notes (Signed)
PROGRESS NOTE    Austin Chang  OIZ:[id number] DOB: 12-07-1965 DOA: 01/30/2020 PCP: Josetta Huddle, MD   Brief Narrative:  Austin Chang is a 55 y.o. male with hx of colitis (on Humira), diverticulosis, hypertension, obesity, who presented to the hospital on 01/30/2020 secondary to dyspnea and general malaise, was found to be Covid positive. Patient states for about a week he has been feeling unwell.  He has had associated general malaise, nausea, vomiting once today, abdominal pain, and diarrhea.  Initially went to his PCP when symptoms started and he was found to be negative.  He was given some medication to help with his symptoms that initially helped somewhat however he started to get progressive shortness of breath and more lethargic.  He has been sleeping a lot more.  His wife told him he looked bad.  He then was evaluated at outside facility and he said they told him he was going to be admitted. He does not have any respiratory issues at baseline, does not use inhalers or oxygen.  He received 2 of the vaccines and was planning to get his booster soon. On initial presentation, patient febrile, temperature 39.6 C, tachycardic, rate 126, tachypneic, respiratory rate 32, O2 sats 78% on room air. He was given Decadron, remdesivir, breathing treatments, IV fluids, and started on O2 supplementation with pending transfer to [facility]:   Principal Problem:   Pneumonia due to COVID-19 virus Active Problems:   Essential hypertension   Inflammatory bowel disease   Acute respiratory failure with hypoxia (HCC)   Hyponatremia   Acute hypoxic respiratory failure secondary to Covid pneumonia with likely concurrent bacterial pneumonia -Patient saturations 78% on room air at intake -Covid positive on 01/30/2020 -Chest x-ray on admission consistent with multifocal pneumonia - personally reviewed -Continue remdesivir, Decadron -breathing treatments and antitussives as  needed -Given procalcitonin 6, significant O2 requirement, and being immunocompromised, will start on azithromycin and Rocephin for coverage of community-acquired pneumonia and hold Barcitinib SpO2: 91 % O2 Flow Rate (L/min): 6 L/min Recent Labs    01/30/20 1319 01/30/20 1407 02/01/20 0417  DDIMER  --  1.20* 3.04*  FERRITIN 1,421*  --  906*  LDH  --  239*  --   CRP 23.1*  --  12.3*    Hypovolemic hyponatremia -Markedly improved with PO intake and IVF - Follow clinically - essentially resolved at this point  Essential hypertension -We will hold off on restarting home BP meds as blood pressure is well controlled and waiting medication reconciliation  Immunocompromise host given his Humira for his colitis -Currently stable this time, continue to monitor   DVT prophylaxis: Lovenox Code Status: Full Family Communication: None available  Status is: Inpatient  Dispo: The patient is from: Home              Anticipated d/c is to: Home              Anticipated d/c date is: 48 to 72 hours pending clinical course              Patient currently not medically stable for discharge  Consultants:   None  Procedures:   None  Antimicrobials:  Remdesivir  Subjective: No acute issues or events overnight, respiratory status improving drastically, some fatigue ongoing but denies nausea, vomiting, diarrhea, constipation, headache, fevers, chills.  Objective: Vitals:   02/01/20 0139 02/01/20 0528 02/01/20 0911 02/01/20 1504  BP: 122/74 121/80 129/77 139/81  Pulse: 92 87 70  89  Resp: (!) 24 (!) 24 20 (!) 24  Temp: 98.2 F (36.8 C) 98.3 F (36.8 C) 98 F (36.7 C) 99 F (37.2 C)  TempSrc: Oral Oral Oral Oral  SpO2: 96% 93% 92% 91%  Weight:  116.1 kg    Height:        Intake/Output Summary (Last 24 hours) at 02/01/2020 1510 Last data filed at 02/01/2020 0309 Gross per 24 hour  Intake 838.92 ml  Output 1450 ml  Net -611.08 ml   Filed Weights   01/30/20 1548 02/01/20 0528   Weight: 115.5 kg 116.1 kg    Examination:  General exam: Appears calm and comfortable  Respiratory system: Clear to auscultation. Respiratory effort normal. Cardiovascular system: S1 & S2 heard, RRR. No JVD, murmurs, rubs, gallops or clicks. No pedal edema. Gastrointestinal system: Abdomen is nondistended, soft and nontender. No organomegaly or masses felt. Normal bowel sounds heard. Central nervous system: Alert and oriented. No focal neurological deficits. Extremities: Symmetric 5 x 5 power. Skin: No rashes, lesions or ulcers Psychiatry: Judgement and insight appear normal. Mood & affect appropriate.     Data Reviewed: I have personally reviewed following labs and imaging studies  CBC: Recent Labs  Lab 01/30/20 1319 02/01/20 0417  WBC 5.6 7.4  NEUTROABS 4.2 5.9  HGB 14.1 12.4*  HCT 39.0 35.2*  MCV 92.9 96.4  PLT 214 188   Basic Metabolic Panel: Recent Labs  Lab 01/30/20 1407 02/01/20 0417  NA 120* 132*  K 4.2 4.3  CL 84* 95*  CO2 25 27  GLUCOSE 122* 186*  BUN 5* 12  CREATININE 0.96 0.78  CALCIUM 8.5* 8.4*  MG  --  2.0  PHOS  --  2.0*   GFR: Estimated Creatinine Clearance: 131.5 mL/min (by C-G formula based on SCr of 0.78 mg/dL). Liver Function Tests: Recent Labs  Lab 01/30/20 1407 02/01/20 0417  AST 60* 26  ALT 48* 33  ALKPHOS 125 96  BILITOT 0.9 0.5  PROT 6.8 5.9*  ALBUMIN 3.1* 2.7*   No results for input(s): LIPASE, AMYLASE in the last 168 hours. No results for input(s): AMMONIA in the last 168 hours. Coagulation Profile: No results for input(s): INR, PROTIME in the last 168 hours. Cardiac Enzymes: No results for input(s): CKTOTAL, CKMB, CKMBINDEX, TROPONINI in the last 168 hours. BNP (last 3 results) No results for input(s): PROBNP in the last 8760 hours. HbA1C: No results for input(s): HGBA1C in the last 72 hours. CBG: No results for input(s): GLUCAP in the last 168 hours. Lipid Profile: Recent Labs    01/30/20 1407  TRIG 116    Thyroid Function Tests: No results for input(s): TSH, T4TOTAL, FREET4, T3FREE, THYROIDAB in the last 72 hours. Anemia Panel: Recent Labs    01/30/20 1319 02/01/20 0417  FERRITIN 1,421* 906*   Sepsis Labs: Recent Labs  Lab 01/30/20 1319 01/30/20 1407  PROCALCITON  --  6.34  LATICACIDVEN 1.1  --     Recent Results (from the past 240 hour(s))  Resp Panel by RT-PCR (Flu A&B, Covid) Nasopharyngeal Swab     Status: Abnormal   Collection Time: 01/30/20  1:19 PM   Specimen: Nasopharyngeal Swab; Nasopharyngeal(NP) swabs in vial transport medium  Result Value Ref Range Status   SARS Coronavirus 2 by RT PCR POSITIVE (A) NEGATIVE Final    Comment: RESULT CALLED TO, READ BACK BY AND VERIFIED WITH: Josph Macho RN [id number] PHILLIPS C (NOTE) SARS-CoV-2 target nucleic acids are DETECTED.  The SARS-CoV-2 RNA is  generally detectable in upper respiratory specimens during the acute phase of infection. Positive results are indicative of the presence of the identified virus, but do not rule out bacterial infection or co-infection with other pathogens not detected by the test. Clinical correlation with patient history and other diagnostic information is necessary to determine patient infection status. The expected result is Negative.  Fact Sheet for Patients: EntrepreneurPulse.com.au  Fact Sheet for Healthcare Providers: IncredibleEmployment.be  This test is not yet approved or cleared by the [place] FDA and  has been authorized for detection and/or diagnosis of SARS-CoV-2 by FDA under an Emergency Use Authorization (EUA).  This EUA will remain in effect (meaning this test can be  used) for the duration of  the COVID-19 declaration under Section 564(b)(1) of the Act, 21 U.S.C. section 360bbb-3(b)(1), unless the authorization is terminated or revoked sooner.     Influenza A by PCR NEGATIVE NEGATIVE Final   Influenza B by PCR NEGATIVE NEGATIVE  Final    Comment: (NOTE) The Xpert Xpress SARS-CoV-2/FLU/RSV plus assay is intended as an aid in the diagnosis of influenza from Nasopharyngeal swab specimens and should not be used as a sole basis for treatment. Nasal washings and aspirates are unacceptable for Xpert Xpress SARS-CoV-2/FLU/RSV testing.  Fact Sheet for Patients: EntrepreneurPulse.com.au  Fact Sheet for Healthcare Providers: IncredibleEmployment.be  This test is not yet approved or cleared by the [place] FDA and has been authorized for detection and/or diagnosis of SARS-CoV-2 by FDA under an Emergency Use Authorization (EUA). This EUA will remain in effect (meaning this test can be used) for the duration of the COVID-19 declaration under Section 564(b)(1) of the Act, 21 U.S.C. section 360bbb-3(b)(1), unless the authorization is terminated or revoked.  Performed at [facility], [address] [place], [place] [address]   Blood Culture (routine x 2)     Status: None (Preliminary result)   Collection Time: 01/30/20  1:19 PM   Specimen: BLOOD  Result Value Ref Range Status   Specimen Description   Final    BLOOD RIGHT ARM Performed at [facility], [address] [place], [place] [address]    Special Requests   Final    BOTTLES DRAWN AEROBIC AND ANAEROBIC Blood Culture adequate volume Performed at [facility], [address] [place], [place] [address]    Culture   Final    NO GROWTH 2 DAYS Performed at [facility], [place] [address] [place], [place] [address]    Report Status PENDING  Incomplete  Blood Culture (routine x 2)     Status: None (Preliminary result)   Collection Time: 01/30/20  1:37 PM   Specimen: BLOOD RIGHT HAND  Result Value Ref Range Status   Specimen Description   Final    BLOOD RIGHT HAND Performed at [facility], [address] [place], [place] [address]    Special Requests   Final     BOTTLES DRAWN AEROBIC AND ANAEROBIC Blood Culture results may not be optimal due to an inadequate volume of blood received in culture bottles Performed at [facility], [address] [place], [place] [address]    Culture   Final    NO GROWTH 2 DAYS Performed at [facility], [place] [address] [place], [place] [address]    Report Status PENDING  Incomplete         Radiology Studies: No results found.  Scheduled Meds: . azithromycin  250 mg Oral Daily  . dexamethasone (DECADRON) injection  6 mg Intravenous Q24H  . enoxaparin (LOVENOX) injection  60 mg Subcutaneous Q24H   Continuous Infusions: . sodium chloride Stopped (01/30/20 1801)  . sodium chloride 500 mL (01/31/20 2302)  . cefTRIAXone (ROCEPHIN)  IV 1 g (01/31/20 2306)  . remdesivir 100 mg in NS 100 mL 100 mg (02/01/20 1039)     LOS: 1 day   Time spent: 57 min  Little Ishikawa, DO [facility]  If 7PM-7AM, please contact night-coverage [email or web address]  02/01/2020, 3:10 PM

## 2020-02-02 DIAGNOSIS — U071 COVID-19: Secondary | ICD-10-CM | POA: Diagnosis not present

## 2020-02-02 DIAGNOSIS — J1282 Pneumonia due to coronavirus disease 2019: Secondary | ICD-10-CM | POA: Diagnosis not present

## 2020-02-02 LAB — CBC WITH DIFFERENTIAL/PLATELET
Abs Immature Granulocytes: 0.03 10*3/uL (ref 0.00–0.07)
Basophils Absolute: 0 10*3/uL (ref 0.0–0.1)
Basophils Relative: 0 %
Eosinophils Absolute: 0 10*3/uL (ref 0.0–0.5)
Eosinophils Relative: 0 %
HCT: 39 % (ref 39.0–52.0)
Hemoglobin: 13.7 g/dL (ref 13.0–17.0)
Immature Granulocytes: 0 %
Lymphocytes Relative: 8 %
Lymphs Abs: 0.6 10*3/uL — ABNORMAL LOW (ref 0.7–4.0)
MCH: 33.9 pg (ref 26.0–34.0)
MCHC: 35.1 g/dL (ref 30.0–36.0)
MCV: 96.5 fL (ref 80.0–100.0)
Monocytes Absolute: 0.7 10*3/uL (ref 0.1–1.0)
Monocytes Relative: 11 %
Neutro Abs: 5.5 10*3/uL (ref 1.7–7.7)
Neutrophils Relative %: 81 %
Platelets: 297 10*3/uL (ref 150–400)
RBC: 4.04 MIL/uL — ABNORMAL LOW (ref 4.22–5.81)
RDW: 12 % (ref 11.5–15.5)
WBC: 6.9 10*3/uL (ref 4.0–10.5)
nRBC: 0 % (ref 0.0–0.2)

## 2020-02-02 LAB — COMPREHENSIVE METABOLIC PANEL
ALT: 34 U/L (ref 0–44)
AST: 22 U/L (ref 15–41)
Albumin: 3.1 g/dL — ABNORMAL LOW (ref 3.5–5.0)
Alkaline Phosphatase: 96 U/L (ref 38–126)
Anion gap: 12 (ref 5–15)
BUN: 15 mg/dL (ref 6–20)
CO2: 25 mmol/L (ref 22–32)
Calcium: 9 mg/dL (ref 8.9–10.3)
Chloride: 95 mmol/L — ABNORMAL LOW (ref 98–111)
Creatinine, Ser: 0.76 mg/dL (ref 0.61–1.24)
GFR, Estimated: >60 mL/min (ref >60)
Glucose, Bld: 248 mg/dL — ABNORMAL HIGH (ref 70–99)
Potassium: 4.9 mmol/L (ref 3.5–5.1)
Sodium: 132 mmol/L — ABNORMAL LOW (ref 135–145)
Total Bilirubin: 0.5 mg/dL (ref 0.3–1.2)
Total Protein: 6.6 g/dL (ref 6.5–8.1)

## 2020-02-02 LAB — PHOSPHORUS: Phosphorus: 3.2 mg/dL (ref 2.5–4.6)

## 2020-02-02 LAB — C-REACTIVE PROTEIN: CRP: 6.4 mg/dL — ABNORMAL HIGH (ref <1.0)

## 2020-02-02 LAB — FERRITIN: Ferritin: 807 ng/mL — ABNORMAL HIGH (ref 24–336)

## 2020-02-02 LAB — D-DIMER, QUANTITATIVE: D-Dimer, Quant: 5.11 ug/mL-FEU — ABNORMAL HIGH (ref 0.00–0.50)

## 2020-02-02 LAB — MAGNESIUM: Magnesium: 2.2 mg/dL (ref 1.7–2.4)

## 2020-02-02 MED ORDER — AZITHROMYCIN 250 MG PO TABS: ORAL_TABLET | ORAL | 0 refills | Status: DC

## 2020-02-02 MED ORDER — PREDNISONE 10 MG PO TABS: ORAL_TABLET | ORAL | 0 refills | Status: AC

## 2020-02-02 MED ORDER — INSULIN ASPART 100 UNIT/ML ~~LOC~~ SOLN: 0.0000 [IU] | Freq: Every day | SUBCUTANEOUS | Status: DC

## 2020-02-02 MED ORDER — INSULIN ASPART 100 UNIT/ML ~~LOC~~ SOLN: 0.0000 [IU] | Freq: Three times a day (TID) | SUBCUTANEOUS | Status: DC

## 2020-02-02 NOTE — Progress Notes (Signed)
Patient will be discharged home.  Discharge instructions will be given.  Patient stated he has already received scripts on his phone.  IV will be d/cd.

## 2020-02-02 NOTE — Discharge Summary (Signed)
Physician Discharge Summary  Austin Chang:[id number] DOB: 05/05/1965 DOA: 01/30/2020  PCP: Josetta Huddle, MD  Admit date: 01/30/2020 Discharge date: 02/02/2020  Admitted From: Home Disposition: Home  Recommendations for Outpatient Follow-up:  1. Follow up with PCP in 1-2 weeks 2. Please obtain BMP/CBC in one week  Home Health: None Equipment/Devices: Oxygen, 2 L nasal cannula with exertion  Discharge Condition: Stable CODE STATUS: Full Diet recommendation: Low-salt low-fat diet  Brief/Interim Summary: Austin Chang a 55 y.o.malewith hx of colitis (on Humira), diverticulosis, hypertension, obesity,who presentedto the hospital on 01/30/2020 secondary to dyspnea and general malaise, was found to be Covid positive. Patient states for about a week he has been feeling unwell. He has had associated general malaise, nausea, vomiting once today, abdominal pain, and diarrhea. Initially went to his PCP when symptoms started and he was found to be negative. He was given some medication to help with his symptoms that initially helped somewhat however he started to get progressive shortness of breath and more lethargic. He has been sleeping a lot more. His wife told him he looked bad. He then was evaluated at outside facility and he said they told him he was going to be admitted. He does not have any respiratory issues at baseline, does not use inhalers or oxygen. He received 2 of the vaccines and was planning to get his booster soon. On initial presentation, patient febrile, temperature 39.6 C, tachycardic, rate 126, tachypneic, respiratory rate 32, O2 sats 78% on room air. He was given Decadron, remdesivir, breathing treatments, IV fluids, and started on O2 supplementation with pending transfer to [facility].  Patient admitted as above with acute hypoxic respiratory failure and dyspnea noted to be Covid positive with concern for community-acquired pneumonia given immunocompromised  status.  Clinically improved drastically over the past 48 hours, initially received Remdesivir, Decadron as well as azithromycin and Rocephin.  At this time patient's oxygen has markedly improved requiring liters nasal cannula only with exertion, tolerating room air at rest without hypoxia.  As such patient will be continued on steroid taper and azithromycin, given elevated procalcitonin concern that his primary source of hypoxia is bacterial pneumonia although given Covid positive findings cannot rule out concurrent viral illness.  At this point patient is otherwise stable and agreeable for discharge home, close follow-up with PCP in the next 1 to 2 weeks as scheduled, discussed home quarantine given Covid positive status as well as recommendations to return to the hospital should his symptoms acutely worsen.  Discharge Diagnoses:  Principal Problem:   Pneumonia due to COVID-19 virus Active Problems:   Essential hypertension   Inflammatory bowel disease   Acute respiratory failure with hypoxia (HCC)   Hyponatremia    Discharge Instructions  Discharge Instructions    Call MD for:  difficulty breathing, headache or visual disturbances   Complete by: As directed    Diet - low sodium heart healthy   Complete by: As directed    Discharge instructions   Complete by: As directed    ?   Person Under Monitoring Name: Austin Chang  Location: [place] [address]   Infection Prevention Recommendations for Individuals Confirmed to have, or Being Evaluated for, 2019 Novel Coronavirus (COVID-19) Infection Who Receive Care at Home  Individuals who are confirmed to have, or are being evaluated for, COVID-19 should follow the prevention steps below until a healthcare provider or local or state health department says they can return to normal activities.  Stay home except to get medical care You should restrict activities outside your home, except for getting medical care.  Do not go to work, school, or public areas, and do not use public transportation or taxis.  Call ahead before visiting your doctor Before your medical appointment, call the healthcare provider and tell them that you have, or are being evaluated for, COVID-19 infection. This will help the healthcare provider's office take steps to keep other people from getting infected. Ask your healthcare provider to call the local or state health department.  Monitor your symptoms Seek prompt medical attention if your illness is worsening (e.g., difficulty breathing). Before going to your medical appointment, call the healthcare provider and tell them that you have, or are being evaluated for, COVID-19 infection. Ask your healthcare provider to call the local or state health department.  Wear a facemask You should wear a facemask that covers your nose and mouth when you are in the same room with other people and when you visit a healthcare provider. People who live with or visit you should also wear a facemask while they are in the same room with you.  Separate yourself from other people in your home As much as possible, you should stay in a different room from other people in your home. Also, you should use a separate bathroom, if available.  Avoid sharing household items You should not share dishes, drinking glasses, cups, eating utensils, towels, bedding, or other items with other people in your home. After using these items, you should wash them thoroughly with soap and water.  Cover your coughs and sneezes Cover your mouth and nose with a tissue when you cough or sneeze, or you can cough or sneeze into your sleeve. Throw used tissues in a lined trash can, and immediately wash your hands with soap and water for at least 20 seconds or use an alcohol-based hand rub.  Wash your [facility] your hands often and thoroughly with soap and water for at least 20 seconds. You can use an alcohol-based  hand sanitizer if soap and water are not available and if your hands are not visibly dirty. Avoid touching your eyes, nose, and mouth with unwashed hands.   Prevention Steps for Caregivers and Household Members of Individuals Confirmed to have, or Being Evaluated for, COVID-19 Infection Being Cared for in the Home  If you live with, or provide care at home for, a person confirmed to have, or being evaluated for, COVID-19 infection please follow these guidelines to prevent infection:  Follow healthcare provider's instructions Make sure that you understand and can help the patient follow any healthcare provider instructions for all care.  Provide for the patient's basic needs You should help the patient with basic needs in the home and provide support for getting groceries, prescriptions, and other personal needs.  Monitor the patient's symptoms If they are getting sicker, call his or her medical provider and tell them that the patient has, or is being evaluated for, COVID-19 infection. This will help the healthcare provider's office take steps to keep other people from getting infected. Ask the healthcare provider to call the local or state health department.  Limit the number of people who have contact with the patient If possible, have only one caregiver for the patient. Other household members should stay in another home or place of residence. If this is not possible, they should stay in another room, or be separated from the patient as much as possible. Use a  separate bathroom, if available. Restrict visitors who do not have an essential need to be in the home.  Keep older adults, very young children, and other sick people away from the patient Keep older adults, very young children, and those who have compromised immune systems or chronic health conditions away from the patient. This includes people with chronic heart, lung, or kidney conditions, diabetes, and cancer.  Ensure good  ventilation Make sure that shared spaces in the home have good air flow, such as from an air conditioner or an opened window, weather permitting.  Wash your hands often Wash your hands often and thoroughly with soap and water for at least 20 seconds. You can use an alcohol based hand sanitizer if soap and water are not available and if your hands are not visibly dirty. Avoid touching your eyes, nose, and mouth with unwashed hands. Use disposable paper towels to dry your hands. If not available, use dedicated cloth towels and replace them when they become wet.  Wear a facemask and gloves Wear a disposable facemask at all times in the room and gloves when you touch or have contact with the patient's blood, body fluids, and/or secretions or excretions, such as sweat, saliva, sputum, nasal mucus, vomit, urine, or feces.  Ensure the mask fits over your nose and mouth tightly, and do not touch it during use. Throw out disposable facemasks and gloves after using them. Do not reuse. Wash your hands immediately after removing your facemask and gloves. If your personal clothing becomes contaminated, carefully remove clothing and launder. Wash your hands after handling contaminated clothing. Place all used disposable facemasks, gloves, and other waste in a lined container before disposing them with other household waste. Remove gloves and wash your hands immediately after handling these items.  Do not share dishes, glasses, or other household items with the patient Avoid sharing household items. You should not share dishes, drinking glasses, cups, eating utensils, towels, bedding, or other items with a patient who is confirmed to have, or being evaluated for, COVID-19 infection. After the person uses these items, you should wash them thoroughly with soap and water.  Wash laundry thoroughly Immediately remove and wash clothes or bedding that have blood, body fluids, and/or secretions or excretions, such as  sweat, saliva, sputum, nasal mucus, vomit, urine, or feces, on them. Wear gloves when handling laundry from the patient. Read and follow directions on labels of laundry or clothing items and detergent. In general, wash and dry with the warmest temperatures recommended on the label.  Clean all areas the individual has used often Clean all touchable surfaces, such as counters, tabletops, doorknobs, bathroom fixtures, toilets, phones, keyboards, tablets, and bedside tables, every day. Also, clean any surfaces that may have blood, body fluids, and/or secretions or excretions on them. Wear gloves when cleaning surfaces the patient has come in contact with. Use a diluted bleach solution (e.g., dilute bleach with 1 part bleach and 10 parts water) or a household disinfectant with a label that says EPA-registered for coronaviruses. To make a bleach solution at home, add 1 tablespoon of bleach to 1 quart (4 cups) of water. For a larger supply, add  cup of bleach to 1 gallon (16 cups) of water. Read labels of cleaning products and follow recommendations provided on product labels. Labels contain instructions for safe and effective use of the cleaning product including precautions you should take when applying the product, such as wearing gloves or eye protection and making sure you have good  ventilation during use of the product. Remove gloves and wash hands immediately after cleaning.  Monitor yourself for signs and symptoms of illness Caregivers and household members are considered close contacts, should monitor their health, and will be asked to limit movement outside of the home to the extent possible. Follow the monitoring steps for close contacts listed on the symptom monitoring form.   ? If you have additional questions, contact your local health department or call the epidemiologist on call at [phone] (available 24/7). ? This guidance is subject to change. For the most up-to-date guidance from  [facility], please refer to their website: [email or web address].pl   Increase activity slowly   Complete by: As directed      Allergies as of 02/02/2020      Reactions   Septra [bactrim] Other (See Comments)   Very bad migraine   Sulfamethoxazole-trimethoprim    Other reaction(s): massive headache      Medication List    TAKE these medications   acetaminophen 500 MG tablet Commonly known as: TYLENOL Take 500-1,000 mg by mouth every 6 (six) hours as needed for mild pain.   allopurinol 300 MG tablet Commonly known as: ZYLOPRIM Take 300 mg by mouth daily.   amLODipine-valsartan 10-160 MG tablet Commonly known as: EXFORGE Take 1 tablet by mouth daily.   azithromycin 250 MG tablet Commonly known as: Zithromax Take 1 tablet po daily until finished   Colchicine 0.6 MG Caps Take 0.6 mg by mouth daily as needed (gout flare up).   Humira Pen 40 MG/0.4ML Pnkt Generic drug: Adalimumab 40 mg by Subdermal route every 14 (fourteen) days.   indomethacin 75 MG CR capsule Commonly known as: INDOCIN SR Take 75 mg by mouth daily as needed for mild pain. For inflammation.   Magnesium 300 MG Caps Take 300 mg by mouth daily.   ondansetron 8 MG disintegrating tablet Commonly known as: ZOFRAN-ODT Take 8 mg by mouth every 8 (eight) hours as needed for nausea/vomiting.   predniSONE 10 MG tablet Commonly known as: DELTASONE Take 4 tablets (40 mg total) by mouth daily for 3 days, THEN 3 tablets (30 mg total) daily for 3 days, THEN 2 tablets (20 mg total) daily for 3 days, THEN 1 tablet (10 mg total) daily for 3 days. Start taking on: February 02, 2020   Vitamin D 50 MCG (2000 UT) Caps Take 2,000 Units by mouth daily.   zolpidem 10 MG tablet Commonly known as: AMBIEN Take 10 mg by mouth at bedtime as needed for sleep.            Durable Medical Equipment  (From admission, onward)         Start     Ordered   02/02/20 1221  DME Oxygen   Once       Question Answer Comment  Length of Need 6 Months   Mode or (Route) Nasal cannula   Liters per Minute 2   Frequency Continuous (stationary and portable oxygen unit needed)   Oxygen delivery system Gas      02/02/20 1221          Allergies  Allergen Reactions  . Septra [Bactrim] Other (See Comments)    Very bad migraine  . Sulfamethoxazole-Trimethoprim     Other reaction(s): massive headache    Consultations: None  Procedures/Studies: DG Chest Port 1 View  Result Date: 01/30/2020 CLINICAL DATA:  Shortness of breath, fatigue EXAM: PORTABLE CHEST 1 VIEW COMPARISON:  08/04/2010 FINDINGS: Heart size is mildly enlarged. There  are patchy airspace opacities most pronounced within the bilateral lung bases and periphery of the mid lung zones. No pleural effusion or pneumothorax. IMPRESSION: 1. Patchy bilateral airspace opacities favored to represent multifocal atypical/viral pneumonia. 2. Mild cardiomegaly. Electronically Signed   By: Davina Poke D.O.   On: 01/30/2020 14:22     Subjective: No acute issues or events overnight denies nausea, vomiting, diarrhea, constipation, headache and chills.  Continue ongoing but markedly improving.  Hypoxia, while mild with exertion, resolves with 2 L nasal cannula, patient declines ongoing dyspnea.   Discharge Exam: Vitals:   02/02/20 0759 02/02/20 1148  BP: (!) 163/83 (!) 163/83  Pulse: 90 82  Resp: (!) 21 20  Temp: 98.3 F (36.8 C) 97.8 F (36.6 C)  SpO2: (!) 89% 94%   Vitals:   02/02/20 0520 02/02/20 0745 02/02/20 0759 02/02/20 1148  BP:  (!) 163/83 (!) 163/83 (!) 163/83  Pulse:  90 90 82  Resp: (!) 30 (!) 21 (!) 21 20  Temp:  98.3 F (36.8 C) 98.3 F (36.8 C) 97.8 F (36.6 C)  TempSrc:    Oral  SpO2: 91% (!) 89% (!) 89% 94%  Weight:      Height:        General: Pt is alert, awake, not in acute distress Cardiovascular: RRR, S1/S2 +, no rubs, no gallops Respiratory: CTA bilaterally, no wheezing, no  rhonchi Abdominal: Soft, NT, ND, bowel sounds + Extremities: no edema, no cyanosis    The results of significant diagnostics from this hospitalization (including imaging, microbiology, ancillary and laboratory) are listed below for reference.     Microbiology: Recent Results (from the past 240 hour(s))  Resp Panel by RT-PCR (Flu A&B, Covid) Nasopharyngeal Swab     Status: Abnormal   Collection Time: 01/30/20  1:19 PM   Specimen: Nasopharyngeal Swab; Nasopharyngeal(NP) swabs in vial transport medium  Result Value Ref Range Status   SARS Coronavirus 2 by RT PCR POSITIVE (A) NEGATIVE Final    Comment: RESULT CALLED TO, READ BACK BY AND VERIFIED WITH: Josph Macho RN [id number] PHILLIPS C (NOTE) SARS-CoV-2 target nucleic acids are DETECTED.  The SARS-CoV-2 RNA is generally detectable in upper respiratory specimens during the acute phase of infection. Positive results are indicative of the presence of the identified virus, but do not rule out bacterial infection or co-infection with other pathogens not detected by the test. Clinical correlation with patient history and other diagnostic information is necessary to determine patient infection status. The expected result is Negative.  Fact Sheet for Patients: EntrepreneurPulse.com.au  Fact Sheet for Healthcare Providers: IncredibleEmployment.be  This test is not yet approved or cleared by the [place] FDA and  has been authorized for detection and/or diagnosis of SARS-CoV-2 by FDA under an Emergency Use Authorization (EUA).  This EUA will remain in effect (meaning this test can be  used) for the duration of  the COVID-19 declaration under Section 564(b)(1) of the Act, 21 U.S.C. section 360bbb-3(b)(1), unless the authorization is terminated or revoked sooner.     Influenza A by PCR NEGATIVE NEGATIVE Final   Influenza B by PCR NEGATIVE NEGATIVE Final    Comment: (NOTE) The Xpert Xpress  SARS-CoV-2/FLU/RSV plus assay is intended as an aid in the diagnosis of influenza from Nasopharyngeal swab specimens and should not be used as a sole basis for treatment. Nasal washings and aspirates are unacceptable for Xpert Xpress SARS-CoV-2/FLU/RSV testing.  Fact Sheet for Patients: EntrepreneurPulse.com.au  Fact Sheet for Healthcare Providers: IncredibleEmployment.be  This test is not yet approved or cleared by the [place] and has been authorized for detection and/or diagnosis of SARS-CoV-2 by FDA under an Emergency Use Authorization (EUA). This EUA will remain in effect (meaning this test can be used) for the duration of the COVID-19 declaration under Section 564(b)(1) of the Act, 21 U.S.C. section 360bbb-3(b)(1), unless the authorization is terminated or revoked.  Performed at [facility], [address] [place], [place] [address]   Blood Culture (routine x 2)     Status: None (Preliminary result)   Collection Time: 01/30/20  1:19 PM   Specimen: BLOOD  Result Value Ref Range Status   Specimen Description   Final    BLOOD RIGHT ARM Performed at [facility], [address] [place], [place] [address]    Special Requests   Final    BOTTLES DRAWN AEROBIC AND ANAEROBIC Blood Culture adequate volume Performed at [facility], [address] [place], [place] [address]    Culture  Setup Time   Final    GRAM POSITIVE RODS AEROBIC BOTTLE ONLY CRITICAL RESULT CALLED TO, READ BACK BY AND VERIFIED WITH: Sheffield Slider [facility] 8250 02/01/20 A BROWNING    Culture   Final    CULTURE REINCUBATED FOR BETTER GROWTH Performed at [facility], [place] [address] [place], [place] [address]    Report Status PENDING  Incomplete  Blood Culture (routine x 2)     Status: None (Preliminary result)   Collection Time: 01/30/20  1:37 PM   Specimen: BLOOD RIGHT HAND  Result Value Ref Range Status   Specimen  Description   Final    BLOOD RIGHT HAND Performed at [facility], [address] [place], [place] [address]    Special Requests   Final    BOTTLES DRAWN AEROBIC AND ANAEROBIC Blood Culture results may not be optimal due to an inadequate volume of blood received in culture bottles Performed at [facility], [address] [place], [place] [address]    Culture   Final    NO GROWTH 3 DAYS Performed at [facility], [place] [address] [place],  [address]    Report Status PENDING  Incomplete     Labs: BNP (last 3 results) No results for input(s): BNP in the last 8760 hours. Basic Metabolic Panel: Recent Labs  Lab 01/30/20 1407 02/01/20 0417 02/02/20 0342  NA 120* 132* 132*  K 4.2 4.3 4.9  CL 84* 95* 95*  CO2 25 27 25   GLUCOSE 122* 186* 248*  BUN 5* 12 15  CREATININE 0.96 0.78 0.76  CALCIUM 8.5* 8.4* 9.0  MG  --  2.0 2.2  PHOS  --  2.0* 3.2   Liver Function Tests: Recent Labs  Lab 01/30/20 1407 02/01/20 0417 02/02/20 0342  AST 60* 26 22  ALT 48* 33 34  ALKPHOS 125 96 96  BILITOT 0.9 0.5 0.5  PROT 6.8 5.9* 6.6  ALBUMIN 3.1* 2.7* 3.1*   No results for input(s): LIPASE, AMYLASE in the last 168 hours. No results for input(s): AMMONIA in the last 168 hours. CBC: Recent Labs  Lab 01/30/20 1319 02/01/20 0417 02/02/20 0342  WBC 5.6 7.4 6.9  NEUTROABS 4.2 5.9 5.5  HGB 14.1 12.4* 13.7  HCT 39.0 35.2* 39.0  MCV 92.9 96.4 96.5  PLT 214 315 297   Cardiac Enzymes: No results for input(s): CKTOTAL, CKMB, CKMBINDEX, TROPONINI  in the last 168 hours. BNP: Invalid input(s): POCBNP CBG: No results for input(s): GLUCAP in the last 168 hours. D-Dimer Recent Labs    02/01/20 0417 02/02/20 0342  DDIMER 3.04* 5.11*   Hgb A1c No results for input(s): HGBA1C in the last 72 hours. Lipid Profile Recent Labs    01/30/20 1407  TRIG 116   Thyroid function studies No results for input(s): TSH, T4TOTAL, T3FREE, THYROIDAB in the last 72  hours.  Invalid input(s): FREET3 Anemia work up Recent Labs    02/01/20 0417 02/02/20 0342  FERRITIN 906* 807*   Urinalysis    Component Value Date/Time   COLORURINE YELLOW 01/31/2020 2325   APPEARANCEUR CLEAR 01/31/2020 2325   LABSPEC 1.012 01/31/2020 2325   PHURINE 6.0 01/31/2020 2325   GLUCOSEU 150 (A) 01/31/2020 2325   HGBUR NEGATIVE 01/31/2020 2325   BILIRUBINUR NEGATIVE 01/31/2020 2325   KETONESUR NEGATIVE 01/31/2020 2325   PROTEINUR NEGATIVE 01/31/2020 2325   NITRITE NEGATIVE 01/31/2020 2325   LEUKOCYTESUR NEGATIVE 01/31/2020 2325   Sepsis Labs Invalid input(s): PROCALCITONIN,  WBC,  LACTICIDVEN Microbiology Recent Results (from the past 240 hour(s))  Resp Panel by RT-PCR (Flu A&B, Covid) Nasopharyngeal Swab     Status: Abnormal   Collection Time: 01/30/20  1:19 PM   Specimen: Nasopharyngeal Swab; Nasopharyngeal(NP) swabs in vial transport medium  Result Value Ref Range Status   SARS Coronavirus 2 by RT PCR POSITIVE (A) NEGATIVE Final    Comment: RESULT CALLED TO, READ BACK BY AND VERIFIED WITH: Josph Macho RN [id number] PHILLIPS C (NOTE) SARS-CoV-2 target nucleic acids are DETECTED.  The SARS-CoV-2 RNA is generally detectable in upper respiratory specimens during the acute phase of infection. Positive results are indicative of the presence of the identified virus, but do not rule out bacterial infection or co-infection with other pathogens not detected by the test. Clinical correlation with patient history and other diagnostic information is necessary to determine patient infection status. The expected result is Negative.  Fact Sheet for Patients: EntrepreneurPulse.com.au  Fact Sheet for Healthcare Providers: IncredibleEmployment.be  This test is not yet approved or cleared by the [place] FDA and  has been authorized for detection and/or diagnosis of SARS-CoV-2 by FDA under an Emergency Use Authorization (EUA).  This  EUA will remain in effect (meaning this test can be  used) for the duration of  the COVID-19 declaration under Section 564(b)(1) of the Act, 21 U.S.C. section 360bbb-3(b)(1), unless the authorization is terminated or revoked sooner.     Influenza A by PCR NEGATIVE NEGATIVE Final   Influenza B by PCR NEGATIVE NEGATIVE Final    Comment: (NOTE) The Xpert Xpress SARS-CoV-2/FLU/RSV plus assay is intended as an aid in the diagnosis of influenza from Nasopharyngeal swab specimens and should not be used as a sole basis for treatment. Nasal washings and aspirates are unacceptable for Xpert Xpress SARS-CoV-2/FLU/RSV testing.  Fact Sheet for Patients: EntrepreneurPulse.com.au  Fact Sheet for Healthcare Providers: IncredibleEmployment.be  This test is not yet approved or cleared by the [place] FDA and has been authorized for detection and/or diagnosis of SARS-CoV-2 by FDA under an Emergency Use Authorization (EUA). This EUA will remain in effect (meaning this test can be used) for the duration of the COVID-19 declaration under Section 564(b)(1) of the Act, 21 U.S.C. section 360bbb-3(b)(1), unless the authorization is terminated or revoked.  Performed at [facility], [place] [address] [place], [place] [address]   Blood Culture (routine x 2)  Status: None (Preliminary result)   Collection Time: 01/30/20  1:19 PM   Specimen: BLOOD  Result Value Ref Range Status   Specimen Description   Final    BLOOD RIGHT ARM Performed at [facility], [address] [place], [place] [address]    Special Requests   Final    BOTTLES DRAWN AEROBIC AND ANAEROBIC Blood Culture adequate volume Performed at [facility], [address] [place], [place] [address]    Culture  Setup Time   Final    GRAM POSITIVE RODS AEROBIC BOTTLE ONLY CRITICAL RESULT CALLED TO, READ BACK BY AND VERIFIED WITH: Sheffield Slider [facility] [id number] 02/01/20 A  BROWNING    Culture   Final    CULTURE REINCUBATED FOR BETTER GROWTH Performed at [facility], [place] [address] [place], [place] [address]    Report Status PENDING  Incomplete  Blood Culture (routine x 2)     Status: None (Preliminary result)   Collection Time: 01/30/20  1:37 PM   Specimen: BLOOD RIGHT HAND  Result Value Ref Range Status   Specimen Description   Final    BLOOD RIGHT HAND Performed at [facility], [address] [place], [place] [address]    Special Requests   Final    BOTTLES DRAWN AEROBIC AND ANAEROBIC Blood Culture results may not be optimal due to an inadequate volume of blood received in culture bottles Performed at [facility], [address] [place], [place] [address]    Culture   Final    NO GROWTH 3 DAYS Performed at [facility], [place] [address], [place], [place] [address]    Report Status PENDING  Incomplete     Time coordinating discharge: Over 30 minutes  SIGNED:   Little Ishikawa, DO [facility] 02/02/2020, 12:24 PM Pager   If 7PM-7AM, please contact night-coverage [email or web address]

## 2020-02-02 NOTE — Progress Notes (Signed)
   02/02/20 0516  Assess: if the MEWS score is Yellow or Red  Were vital signs taken at a resting state? Yes  Focused Assessment No change from prior assessment  Early Detection of Sepsis Score *See Row Information* Low  MEWS guidelines implemented *See Row Information* Yes  Oxygen reapplied to patient via nasal cannula. No signs and symptoms of distress noted. Resting with eyes closed.

## 2020-02-02 NOTE — Plan of Care (Signed)
  Problem: Education: Goal: Knowledge of General Education information will improve Description: Including pain rating scale, medication(s)/side effects and non-pharmacologic comfort measures 02/02/2020 0949 by Sandre Kitty, RN Outcome: Progressing 02/02/2020 0949 by Sandre Kitty, RN Outcome: Progressing   Problem: Health Behavior/Discharge Planning: Goal: Ability to manage health-related needs will improve Outcome: Progressing   Problem: Clinical Measurements: Goal: Ability to maintain clinical measurements within normal limits will improve Outcome: Progressing   Problem: Coping: Goal: Level of anxiety will decrease Outcome: Progressing

## 2020-02-02 NOTE — Progress Notes (Addendum)
SATURATION QUALIFICATIONS: (This note is used to comply with regulatory documentation for home oxygen)  Patient Saturations on Room Air at Rest =91%  Patient Saturations on Room Air while Ambulating = 86%  Patient Saturations on 2Liters of oxygen while Ambulating = 92%  Please briefly explain why patient needs home oxygen:Oxygen Saturation desats during ambulation. COVID Diagnosis.

## 2020-02-02 NOTE — Progress Notes (Signed)
Patient will be discharged home via private vehicle.  IV will be discontinued.  Patient stated he has already received scripts on his phone.  Will review all discharge instructions with patient.

## 2020-02-02 NOTE — TOC Transition Note (Signed)
Transition of Care [facility]) - CM/SW Discharge Note   Patient Details  Name: Austin Chang MRN: [id number] Date of Birth: [birth year]  [facility]) CM/SW Contact:  Trish Mage, LCSW Phone Number: 02/02/2020, 12:55 PM   Clinical Narrative:   Patient who is d/cing today is in need of home O2, has a ride home.  SAT note and order seen and appreciated.  Spoke with Thedore Mins at [employer] who will arrange for travel cannister and home unit delivery.  No further needs identified.  TOC sign off.    Final next level of care: Home/Self Care Barriers to Discharge: No Barriers Identified   Patient Goals and CMS Choice        Discharge Placement                       Discharge Plan and Services                                     Social Determinants of Health (SDOH) Interventions     Readmission Risk Interventions No flowsheet data found.

## 2020-02-02 NOTE — Progress Notes (Signed)
   02/02/20 0339  Vitals  Temp 97.6 F (36.4 C)  Assessed patient. Resting with eyes closed.  No signs and symptoms of distress.

## 2020-02-02 NOTE — Progress Notes (Signed)
Ambulated to restroom unassisted.  o2 sats decreased to 87 on room air. Pulse increased to 125, shortness of breathe noted. Assisted back to bed. 2 L02 via nasal cannula applied. O2 Sats increased to 93 percent. Pulse decreased gradually to 88. Shortness of breathe resolved.

## 2020-02-04 LAB — CULTURE, BLOOD (ROUTINE X 2): Culture: NO GROWTH

## 2020-02-07 LAB — CULTURE, BLOOD (ROUTINE X 2): Special Requests: ADEQUATE

## 2020-02-11 DIAGNOSIS — Z23 Encounter for immunization: Secondary | ICD-10-CM | POA: Diagnosis not present

## 2020-02-11 DIAGNOSIS — M109 Gout, unspecified: Secondary | ICD-10-CM | POA: Diagnosis not present

## 2020-02-11 DIAGNOSIS — U071 COVID-19: Secondary | ICD-10-CM | POA: Diagnosis not present

## 2020-02-11 DIAGNOSIS — K5100 Ulcerative (chronic) pancolitis without complications: Secondary | ICD-10-CM | POA: Diagnosis not present

## 2020-02-11 DIAGNOSIS — I10 Essential (primary) hypertension: Secondary | ICD-10-CM | POA: Diagnosis not present

## 2020-02-11 DIAGNOSIS — D509 Iron deficiency anemia, unspecified: Secondary | ICD-10-CM | POA: Diagnosis not present

## 2020-02-11 DIAGNOSIS — Z125 Encounter for screening for malignant neoplasm of prostate: Secondary | ICD-10-CM | POA: Diagnosis not present

## 2020-02-11 DIAGNOSIS — M459 Ankylosing spondylitis of unspecified sites in spine: Secondary | ICD-10-CM | POA: Diagnosis not present

## 2020-02-11 DIAGNOSIS — E559 Vitamin D deficiency, unspecified: Secondary | ICD-10-CM | POA: Diagnosis not present

## 2020-02-11 DIAGNOSIS — Z0000 Encounter for general adult medical examination without abnormal findings: Secondary | ICD-10-CM | POA: Diagnosis not present

## 2020-02-12 DIAGNOSIS — U071 COVID-19: Secondary | ICD-10-CM | POA: Diagnosis not present

## 2020-03-04 DIAGNOSIS — U071 COVID-19: Secondary | ICD-10-CM | POA: Diagnosis not present

## 2020-03-14 DIAGNOSIS — U071 COVID-19: Secondary | ICD-10-CM | POA: Diagnosis not present

## 2020-03-28 DIAGNOSIS — R635 Abnormal weight gain: Secondary | ICD-10-CM | POA: Diagnosis not present

## 2020-03-28 DIAGNOSIS — R609 Edema, unspecified: Secondary | ICD-10-CM | POA: Diagnosis not present

## 2020-03-31 DIAGNOSIS — I872 Venous insufficiency (chronic) (peripheral): Secondary | ICD-10-CM | POA: Diagnosis not present

## 2020-04-01 DIAGNOSIS — U071 COVID-19: Secondary | ICD-10-CM | POA: Diagnosis not present

## 2020-04-11 DIAGNOSIS — U071 COVID-19: Secondary | ICD-10-CM | POA: Diagnosis not present

## 2020-05-12 DIAGNOSIS — U071 COVID-19: Secondary | ICD-10-CM | POA: Diagnosis not present

## 2020-06-06 DIAGNOSIS — K5190 Ulcerative colitis, unspecified, without complications: Secondary | ICD-10-CM | POA: Diagnosis not present

## 2020-06-06 DIAGNOSIS — M2550 Pain in unspecified joint: Secondary | ICD-10-CM | POA: Diagnosis not present

## 2020-06-06 DIAGNOSIS — M459 Ankylosing spondylitis of unspecified sites in spine: Secondary | ICD-10-CM | POA: Diagnosis not present

## 2020-06-06 DIAGNOSIS — M1009 Idiopathic gout, multiple sites: Secondary | ICD-10-CM | POA: Diagnosis not present

## 2020-06-11 DIAGNOSIS — U071 COVID-19: Secondary | ICD-10-CM | POA: Diagnosis not present

## 2020-06-22 DIAGNOSIS — L309 Dermatitis, unspecified: Secondary | ICD-10-CM | POA: Diagnosis not present

## 2020-06-22 DIAGNOSIS — I872 Venous insufficiency (chronic) (peripheral): Secondary | ICD-10-CM | POA: Diagnosis not present

## 2020-06-22 DIAGNOSIS — R609 Edema, unspecified: Secondary | ICD-10-CM | POA: Diagnosis not present

## 2020-06-22 DIAGNOSIS — L0390 Cellulitis, unspecified: Secondary | ICD-10-CM | POA: Diagnosis not present

## 2020-07-02 DIAGNOSIS — U071 COVID-19: Secondary | ICD-10-CM | POA: Diagnosis not present

## 2020-07-12 DIAGNOSIS — U071 COVID-19: Secondary | ICD-10-CM | POA: Diagnosis not present

## 2020-08-01 DIAGNOSIS — U071 COVID-19: Secondary | ICD-10-CM | POA: Diagnosis not present

## 2020-08-11 DIAGNOSIS — U071 COVID-19: Secondary | ICD-10-CM | POA: Diagnosis not present

## 2020-09-01 DIAGNOSIS — U071 COVID-19: Secondary | ICD-10-CM | POA: Diagnosis not present

## 2020-09-07 DIAGNOSIS — R739 Hyperglycemia, unspecified: Secondary | ICD-10-CM | POA: Diagnosis not present

## 2020-09-07 DIAGNOSIS — D509 Iron deficiency anemia, unspecified: Secondary | ICD-10-CM | POA: Diagnosis not present

## 2020-09-07 DIAGNOSIS — M109 Gout, unspecified: Secondary | ICD-10-CM | POA: Diagnosis not present

## 2020-09-07 DIAGNOSIS — R609 Edema, unspecified: Secondary | ICD-10-CM | POA: Diagnosis not present

## 2020-09-07 DIAGNOSIS — I872 Venous insufficiency (chronic) (peripheral): Secondary | ICD-10-CM | POA: Diagnosis not present

## 2020-09-07 DIAGNOSIS — L0390 Cellulitis, unspecified: Secondary | ICD-10-CM | POA: Diagnosis not present

## 2020-09-11 DIAGNOSIS — U071 COVID-19: Secondary | ICD-10-CM | POA: Diagnosis not present

## 2020-10-02 DIAGNOSIS — U071 COVID-19: Secondary | ICD-10-CM | POA: Diagnosis not present

## 2020-10-05 DIAGNOSIS — R5383 Other fatigue: Secondary | ICD-10-CM | POA: Diagnosis not present

## 2020-10-05 DIAGNOSIS — R509 Fever, unspecified: Secondary | ICD-10-CM | POA: Diagnosis not present

## 2020-10-05 DIAGNOSIS — Z03818 Encounter for observation for suspected exposure to other biological agents ruled out: Secondary | ICD-10-CM | POA: Diagnosis not present

## 2020-10-06 ENCOUNTER — Emergency Department (HOSPITAL_BASED_OUTPATIENT_CLINIC_OR_DEPARTMENT_OTHER): Payer: BC Managed Care – PPO

## 2020-10-06 ENCOUNTER — Inpatient Hospital Stay (HOSPITAL_BASED_OUTPATIENT_CLINIC_OR_DEPARTMENT_OTHER)
Admission: EM | Admit: 2020-10-06 | Discharge: 2020-10-17 | DRG: 871 | Disposition: A | Discharge status: Home / Self Care | Payer: BC Managed Care – PPO | Attending: Internal Medicine | Admitting: Internal Medicine

## 2020-10-06 ENCOUNTER — Encounter (HOSPITAL_BASED_OUTPATIENT_CLINIC_OR_DEPARTMENT_OTHER): Payer: Self-pay

## 2020-10-06 ENCOUNTER — Other Ambulatory Visit: Payer: Self-pay

## 2020-10-06 DIAGNOSIS — Z882 Allergy status to sulfonamides status: Secondary | ICD-10-CM | POA: Diagnosis not present

## 2020-10-06 DIAGNOSIS — Z8701 Personal history of pneumonia (recurrent): Secondary | ICD-10-CM

## 2020-10-06 DIAGNOSIS — R0602 Shortness of breath: Secondary | ICD-10-CM | POA: Diagnosis not present

## 2020-10-06 DIAGNOSIS — D849 Immunodeficiency, unspecified: Secondary | ICD-10-CM | POA: Diagnosis present

## 2020-10-06 DIAGNOSIS — R918 Other nonspecific abnormal finding of lung field: Secondary | ICD-10-CM | POA: Diagnosis not present

## 2020-10-06 DIAGNOSIS — E876 Hypokalemia: Secondary | ICD-10-CM | POA: Diagnosis not present

## 2020-10-06 DIAGNOSIS — I110 Hypertensive heart disease with heart failure: Secondary | ICD-10-CM | POA: Diagnosis present

## 2020-10-06 DIAGNOSIS — I509 Heart failure, unspecified: Secondary | ICD-10-CM

## 2020-10-06 DIAGNOSIS — Z8619 Personal history of other infectious and parasitic diseases: Secondary | ICD-10-CM

## 2020-10-06 DIAGNOSIS — K5790 Diverticulosis of intestine, part unspecified, without perforation or abscess without bleeding: Secondary | ICD-10-CM | POA: Diagnosis present

## 2020-10-06 DIAGNOSIS — Z20822 Contact with and (suspected) exposure to covid-19: Secondary | ICD-10-CM | POA: Diagnosis present

## 2020-10-06 DIAGNOSIS — I10 Essential (primary) hypertension: Secondary | ICD-10-CM | POA: Diagnosis not present

## 2020-10-06 DIAGNOSIS — R140 Abdominal distension (gaseous): Secondary | ICD-10-CM | POA: Diagnosis not present

## 2020-10-06 DIAGNOSIS — F40240 Claustrophobia: Secondary | ICD-10-CM | POA: Diagnosis present

## 2020-10-06 DIAGNOSIS — B370 Candidal stomatitis: Secondary | ICD-10-CM | POA: Diagnosis present

## 2020-10-06 DIAGNOSIS — R6510 Systemic inflammatory response syndrome (SIRS) of non-infectious origin without acute organ dysfunction: Secondary | ICD-10-CM

## 2020-10-06 DIAGNOSIS — R509 Fever, unspecified: Secondary | ICD-10-CM | POA: Diagnosis present

## 2020-10-06 DIAGNOSIS — E669 Obesity, unspecified: Secondary | ICD-10-CM | POA: Diagnosis present

## 2020-10-06 DIAGNOSIS — Z79899 Other long term (current) drug therapy: Secondary | ICD-10-CM

## 2020-10-06 DIAGNOSIS — K760 Fatty (change of) liver, not elsewhere classified: Secondary | ICD-10-CM | POA: Diagnosis not present

## 2020-10-06 DIAGNOSIS — Z9889 Other specified postprocedural states: Secondary | ICD-10-CM | POA: Diagnosis not present

## 2020-10-06 DIAGNOSIS — E871 Hypo-osmolality and hyponatremia: Secondary | ICD-10-CM | POA: Diagnosis present

## 2020-10-06 DIAGNOSIS — E861 Hypovolemia: Secondary | ICD-10-CM | POA: Diagnosis present

## 2020-10-06 DIAGNOSIS — K529 Noninfective gastroenteritis and colitis, unspecified: Secondary | ICD-10-CM | POA: Diagnosis present

## 2020-10-06 DIAGNOSIS — Z6837 Body mass index (BMI) 37.0-37.9, adult: Secondary | ICD-10-CM

## 2020-10-06 DIAGNOSIS — U071 COVID-19: Secondary | ICD-10-CM | POA: Diagnosis not present

## 2020-10-06 DIAGNOSIS — A419 Sepsis, unspecified organism: Secondary | ICD-10-CM | POA: Diagnosis not present

## 2020-10-06 DIAGNOSIS — R5383 Other fatigue: Secondary | ICD-10-CM | POA: Diagnosis not present

## 2020-10-06 DIAGNOSIS — R109 Unspecified abdominal pain: Secondary | ICD-10-CM | POA: Diagnosis not present

## 2020-10-06 DIAGNOSIS — Z87898 Personal history of other specified conditions: Secondary | ICD-10-CM | POA: Diagnosis not present

## 2020-10-06 DIAGNOSIS — I898 Other specified noninfective disorders of lymphatic vessels and lymph nodes: Secondary | ICD-10-CM | POA: Diagnosis not present

## 2020-10-06 DIAGNOSIS — M1990 Unspecified osteoarthritis, unspecified site: Secondary | ICD-10-CM | POA: Diagnosis present

## 2020-10-06 DIAGNOSIS — I5033 Acute on chronic diastolic (congestive) heart failure: Secondary | ICD-10-CM | POA: Diagnosis not present

## 2020-10-06 DIAGNOSIS — R7989 Other specified abnormal findings of blood chemistry: Secondary | ICD-10-CM | POA: Diagnosis not present

## 2020-10-06 DIAGNOSIS — J189 Pneumonia, unspecified organism: Secondary | ICD-10-CM | POA: Diagnosis not present

## 2020-10-06 DIAGNOSIS — K6389 Other specified diseases of intestine: Secondary | ICD-10-CM | POA: Diagnosis not present

## 2020-10-06 DIAGNOSIS — K51013 Ulcerative (chronic) pancolitis with fistula: Secondary | ICD-10-CM | POA: Diagnosis not present

## 2020-10-06 DIAGNOSIS — K5190 Ulcerative colitis, unspecified, without complications: Secondary | ICD-10-CM | POA: Diagnosis not present

## 2020-10-06 DIAGNOSIS — M545 Low back pain, unspecified: Secondary | ICD-10-CM | POA: Diagnosis not present

## 2020-10-06 DIAGNOSIS — J9601 Acute respiratory failure with hypoxia: Secondary | ICD-10-CM | POA: Diagnosis not present

## 2020-10-06 DIAGNOSIS — L988 Other specified disorders of the skin and subcutaneous tissue: Secondary | ICD-10-CM | POA: Diagnosis not present

## 2020-10-06 DIAGNOSIS — Z8616 Personal history of COVID-19: Secondary | ICD-10-CM | POA: Diagnosis not present

## 2020-10-06 DIAGNOSIS — G9340 Encephalopathy, unspecified: Secondary | ICD-10-CM | POA: Diagnosis present

## 2020-10-06 DIAGNOSIS — Z8249 Family history of ischemic heart disease and other diseases of the circulatory system: Secondary | ICD-10-CM

## 2020-10-06 LAB — URINALYSIS, MICROSCOPIC (REFLEX)

## 2020-10-06 LAB — RESPIRATORY PANEL BY PCR

## 2020-10-06 LAB — CBC WITH DIFFERENTIAL/PLATELET
Abs Immature Granulocytes: 0.02 10*3/uL (ref 0.00–0.07)
Basophils Absolute: 0 10*3/uL (ref 0.0–0.1)
Basophils Relative: 0 %
Eosinophils Absolute: 0 10*3/uL (ref 0.0–0.5)
Eosinophils Relative: 0 %
HCT: 38.1 % — ABNORMAL LOW (ref 39.0–52.0)
Hemoglobin: 14.1 g/dL (ref 13.0–17.0)
Immature Granulocytes: 0 %
Lymphocytes Relative: 11 %
Lymphs Abs: 0.9 10*3/uL (ref 0.7–4.0)
MCH: 33.7 pg (ref 26.0–34.0)
MCHC: 37 g/dL — ABNORMAL HIGH (ref 30.0–36.0)
MCV: 90.9 fL (ref 80.0–100.0)
Monocytes Absolute: 0.6 10*3/uL (ref 0.1–1.0)
Monocytes Relative: 8 %
Neutro Abs: 6.7 10*3/uL (ref 1.7–7.7)
Neutrophils Relative %: 81 %
Platelets: 153 10*3/uL (ref 150–400)
RBC: 4.19 MIL/uL — ABNORMAL LOW (ref 4.22–5.81)
RDW: 11.9 % (ref 11.5–15.5)
WBC: 8.3 10*3/uL (ref 4.0–10.5)
nRBC: 0 % (ref 0.0–0.2)

## 2020-10-06 LAB — COMPREHENSIVE METABOLIC PANEL
ALT: 30 U/L (ref 0–44)
AST: 39 U/L (ref 15–41)
Albumin: 3.3 g/dL — ABNORMAL LOW (ref 3.5–5.0)
Alkaline Phosphatase: 111 U/L (ref 38–126)
Anion gap: 8 (ref 5–15)
BUN: 10 mg/dL (ref 6–20)
CO2: 27 mmol/L (ref 22–32)
Calcium: 8.6 mg/dL — ABNORMAL LOW (ref 8.9–10.3)
Chloride: 88 mmol/L — ABNORMAL LOW (ref 98–111)
Creatinine, Ser: 1.08 mg/dL (ref 0.61–1.24)
GFR, Estimated: >60 mL/min (ref >60)
Glucose, Bld: 153 mg/dL — ABNORMAL HIGH (ref 70–99)
Potassium: 4 mmol/L (ref 3.5–5.1)
Sodium: 123 mmol/L — ABNORMAL LOW (ref 135–145)
Total Bilirubin: 1.1 mg/dL (ref 0.3–1.2)
Total Protein: 7.3 g/dL (ref 6.5–8.1)

## 2020-10-06 LAB — RESP PANEL BY RT-PCR (FLU A&B, COVID) ARPGX2
Influenza A by PCR: NEGATIVE
Influenza A by PCR: NEGATIVE
Influenza B by PCR: NEGATIVE
Influenza B by PCR: NEGATIVE
SARS Coronavirus 2 by RT PCR: NEGATIVE
SARS Coronavirus 2 by RT PCR: NEGATIVE

## 2020-10-06 LAB — URINALYSIS, ROUTINE W REFLEX MICROSCOPIC
Bilirubin Urine: NEGATIVE
Glucose, UA: NEGATIVE mg/dL
Ketones, ur: NEGATIVE mg/dL
Leukocytes,Ua: NEGATIVE
Nitrite: NEGATIVE
Protein, ur: 30 mg/dL — AB
Specific Gravity, Urine: 1.01 (ref 1.005–1.030)
pH: 6 (ref 5.0–8.0)

## 2020-10-06 LAB — TROPONIN I (HIGH SENSITIVITY)
Troponin I (High Sensitivity): 9 ng/L (ref <18)
Troponin I (High Sensitivity): 9 ng/L (ref <18)

## 2020-10-06 LAB — LACTIC ACID, PLASMA: Lactic Acid, Venous: 1.4 mmol/L (ref 0.5–1.9)

## 2020-10-06 LAB — BRAIN NATRIURETIC PEPTIDE: B Natriuretic Peptide: 40 pg/mL (ref 0.0–100.0)

## 2020-10-06 LAB — PROCALCITONIN: Procalcitonin: 1.96 ng/mL

## 2020-10-06 LAB — D-DIMER, QUANTITATIVE: D-Dimer, Quant: 1.85 ug/mL-FEU — ABNORMAL HIGH (ref 0.00–0.50)

## 2020-10-06 MED ORDER — ACETAMINOPHEN 500 MG PO TABS
1000.0000 mg | ORAL_TABLET | Freq: Once | ORAL | Status: AC
  Administered 2020-10-06: 1000 mg via ORAL
  Filled 2020-10-06: qty 2

## 2020-10-06 MED ORDER — IBUPROFEN 600 MG PO TABS
600.0000 mg | ORAL_TABLET | Freq: Four times a day (QID) | ORAL | Status: AC | PRN
Start: 2020-10-06 — End: 2020-10-08
  Administered 2020-10-07 – 2020-10-08 (×2): 600 mg via ORAL
  Filled 2020-10-06 (×2): qty 1

## 2020-10-06 MED ORDER — SODIUM CHLORIDE 0.9 % IV BOLUS
1000.0000 mL | Freq: Once | INTRAVENOUS | Status: AC
  Administered 2020-10-06: 1000 mL via INTRAVENOUS

## 2020-10-06 MED ORDER — IOHEXOL 350 MG/ML SOLN
100.0000 mL | Freq: Once | INTRAVENOUS | Status: AC | PRN
  Administered 2020-10-06: 100 mL via INTRAVENOUS

## 2020-10-06 MED ORDER — FUROSEMIDE 10 MG/ML IJ SOLN
80.0000 mg | Freq: Once | INTRAMUSCULAR | Status: AC
  Administered 2020-10-06: 80 mg via INTRAVENOUS
  Filled 2020-10-06: qty 8

## 2020-10-06 MED ORDER — SODIUM CHLORIDE 0.9 % IV SOLN
1.0000 g | Freq: Once | INTRAVENOUS | Status: AC
  Administered 2020-10-06: 1 g via INTRAVENOUS
  Filled 2020-10-06: qty 10

## 2020-10-06 MED ORDER — SODIUM CHLORIDE 0.9 % IV BOLUS
500.0000 mL | Freq: Once | INTRAVENOUS | Status: AC
  Administered 2020-10-06: 500 mL via INTRAVENOUS

## 2020-10-06 MED ORDER — AZITHROMYCIN 250 MG PO TABS
500.0000 mg | ORAL_TABLET | Freq: Once | ORAL | Status: AC
  Administered 2020-10-06: 500 mg via ORAL
  Filled 2020-10-06: qty 2

## 2020-10-06 MED ORDER — LACTATED RINGERS IV SOLN: INTRAVENOUS | Status: DC

## 2020-10-06 MED ORDER — ACETAMINOPHEN 325 MG PO TABS
650.0000 mg | ORAL_TABLET | Freq: Four times a day (QID) | ORAL | Status: DC | PRN
  Administered 2020-10-06 – 2020-10-14 (×10): 650 mg via ORAL
  Filled 2020-10-06 (×12): qty 2

## 2020-10-06 MED ORDER — LACTATED RINGERS IV SOLN: INTRAVENOUS | Status: AC

## 2020-10-06 MED ORDER — LACTATED RINGERS IV BOLUS: 1000.0000 mL | Freq: Once | INTRAVENOUS | Status: DC

## 2020-10-06 MED ORDER — LACTATED RINGERS IV BOLUS: 500.0000 mL | Freq: Once | INTRAVENOUS | Status: DC

## 2020-10-06 NOTE — ED Provider Notes (Signed)
Physical Exam  BP 131/79   Pulse (!) 102   Temp 99.6 F (37.6 C) (Oral)   Resp 16   Ht 5' 8"  (1.727 m)   Wt 115.2 kg   SpO2 100%   BMI 38.62 kg/m   Physical Exam Vitals and nursing note reviewed.  Constitutional:      Appearance: He is well-developed.     Comments: Tachypneic, tachycardic, on 2 L O2 via nasal cannula, saturating 92% on room air but saturating well on 2 L  HENT:     Head: Normocephalic and atraumatic.  Eyes:     Conjunctiva/sclera: Conjunctivae normal.  Cardiovascular:     Rate and Rhythm: Regular rhythm. Tachycardia present.     Heart sounds: No murmur heard. Pulmonary:     Effort: No respiratory distress.     Breath sounds: Normal breath sounds.     Comments: Mild tachypneic Abdominal:     Palpations: Abdomen is soft.     Tenderness: There is no abdominal tenderness.  Musculoskeletal:     Cervical back: Neck supple.     Right lower leg: Edema present.     Left lower leg: Edema present.     Comments: 2+ bilateral pitting edema  Skin:    General: Skin is warm and dry.  Neurological:     Mental Status: He is alert.    ED Course/Procedures     Procedures  MDM  55 year old male presenting with fatigue, diarrhea, febrile illness at home for the past 4 days, COVID symptoms, home COVID and COVID here negative. Hypoxic here on 2L O2 [place], Hyponatremic to 122.  Troponin and dimer pending, likely CTA PE study. Plan for admit.  I reassessed the patient bedside.  He does have hypoxic respiratory failure with an oxygen requirement to 2 L O2 via nasal cannula.  He is mildly tachypneic and tachycardic to the 120s.  Sinus tachycardia noted on the cardiac monitor.  A CTA PE study resulted negative for PE or focal consolidation suggestive of pneumonia.  He does state that he is supposed to be on Lasix 80 mg p.o. which his PCP prescribe due to concern for development of heart failure.  He is unsure of a last echocardiogram. He has not been taking the medicine for the  last 3 days.  He endorses worsening lower extremity swelling.  On exam he has 2+ lower extremity edema bilaterally.   Point-of-care ultrasound was performed which revealed no evidence of pericardial effusion.  He has received 500 cc normal saline here in the ED. We will administer 80 mg of IV Lasix and consult hospitalist for admission due to acute hypoxic respiratory failure in the setting of a febrile illness. Considered viral illness vs new onset CHF. BNP was ordered and resulted normal.   Hospitalist medicine was consulted for admission given the patient's acute hypoxic respiratory failure of unclear etiology.   Following admission, the patient's temperature was rechecked and he was found to be febrile to 103.19F. He meets SIRS criteria with tachycardia, tachypnea, and fever. Rocephin and Azithromycin ordered. His main symptoms are fever and fatigue at this time. His BNP is normal and he has no pulmonary edema on CT imaging. He denies abdominal pain. He denies any rash. He denies neck stiffness and has negative meningeal findings. He is at his baseline mental status.  He complains of mild dyspnea and mild diarrhea. Will go ahead and administer 30cc/kg of IVF.        Regan Lemming, MD  10/07/20 1152  

## 2020-10-06 NOTE — ED Notes (Signed)
Report given to [facility] with [facility]

## 2020-10-06 NOTE — ED Notes (Signed)
Attempted to call report to [facility] nurse to call me back for report

## 2020-10-06 NOTE — ED Notes (Signed)
Patients oxygen saturation at 92% on room air. Oxygen turned back on at 2 liters

## 2020-10-06 NOTE — ED Notes (Signed)
Report given to Rosebud Poles at [facility]

## 2020-10-06 NOTE — ED Notes (Signed)
Pt has had several episodes of incontinence

## 2020-10-06 NOTE — ED Provider Notes (Signed)
[facility] EMERGENCY DEPARTMENT Provider Note   CSN: [id number] Arrival date & time: 10/06/20  1231     History Chief Complaint  Patient presents with   Fatigue    Austin Chang is a 55 y.o. male.  Had a COVID test at home and one at his PCP's office.  The history is provided by the patient.  Weakness Severity:  Moderate Onset quality:  Gradual Duration:  4 days Timing:  Constant Progression:  Worsening Chronicity:  New Context comment:  No known cause Relieved by:  Nothing Worsened by:  Nothing Ineffective treatments:  None tried Associated symptoms: cough, diarrhea, fever (101.5 Tmax) and nausea   Associated symptoms: no abdominal pain, no arthralgias, no chest pain, no dysuria, no seizures, no shortness of breath and no vomiting       Past Medical History:  Diagnosis Date   Ankylosing spondylitis ([place])    Arthritis    Colitis    Colitis    Diverticulosis    Hypertension    Obesity    Rectal bleeding     Patient Active Problem List   Diagnosis Date Noted   Essential hypertension 01/31/2020   Inflammatory bowel disease 01/31/2020   Acute respiratory failure with hypoxia ([place]) 01/31/2020   Hyponatremia 01/31/2020   Pneumonia due to COVID-19 virus 01/30/2020    Past Surgical History:  Procedure Laterality Date   COLON SURGERY     COLONOSCOPY WITH PROPOFOL N/A 07/11/2016   Procedure: COLONOSCOPY WITH PROPOFOL;  Surgeon: Garlan Fair, MD;  Location: [facility];  Service: Endoscopy;  Laterality: N/A;   COLOSTOMY CLOSURE     ESOPHAGOGASTRODUODENOSCOPY  01/03/2011   Procedure: ESOPHAGOGASTRODUODENOSCOPY (EGD);  Surgeon: Garlan Fair, MD;  Location: [facility];  Service: Endoscopy;  Laterality: N/A;   NECK SURGERY     SPINE SURGERY     TONSILLECTOMY         Family History  Problem Relation Age of Onset   Heart disease Mother    Heart disease Father    Anesthesia problems Neg Hx    Hypotension Neg Hx    Malignant hyperthermia  Neg Hx    Pseudochol deficiency Neg Hx    Colon cancer Neg Hx    Rectal cancer Neg Hx    Esophageal cancer Neg Hx    Liver cancer Neg Hx     Social History   Tobacco Use   Smoking status: Never   Smokeless tobacco: Never  Vaping Use   Vaping Use: Never used  Substance Use Topics   Alcohol use: Yes    Alcohol/week: 5.0 standard drinks    Types: 5 Standard drinks or equivalent per week    Comment: weekly   Drug use: No    Home Medications Prior to Admission medications   Medication Sig Start Date End Date Taking? Authorizing Provider  acetaminophen (TYLENOL) 500 MG tablet Take 500-1,000 mg by mouth every 6 (six) hours as needed for mild pain.    [provider]  allopurinol (ZYLOPRIM) 300 MG tablet Take 300 mg by mouth daily. 11/04/19   [provider]  amLODipine-valsartan (EXFORGE) 10-160 MG tablet Take 1 tablet by mouth daily.    [provider]  azithromycin (ZITHROMAX) 250 MG tablet Take 1 tablet po daily until finished 02/02/20   Little Ishikawa, MD  Cholecalciferol (VITAMIN D) 2000 units CAPS Take 2,000 Units by mouth daily.    [provider]  Colchicine 0.6 MG CAPS Take 0.6 mg by mouth  daily as needed (gout flare up). 08/24/19   [provider]  HUMIRA PEN 40 MG/0.4ML PNKT 40 mg by Subdermal route every 14 (fourteen) days. 11/25/19   [provider]  indomethacin (INDOCIN SR) 75 MG CR capsule Take 75 mg by mouth daily as needed for mild pain. For inflammation.    [provider]  Magnesium 300 MG CAPS Take 300 mg by mouth daily.    [provider]  ondansetron (ZOFRAN-ODT) 8 MG disintegrating tablet Take 8 mg by mouth every 8 (eight) hours as needed for nausea/vomiting. 01/28/20   [provider]  zolpidem (AMBIEN) 10 MG tablet Take 10 mg by mouth at bedtime as needed for sleep. 01/20/20   [provider]    Allergies    Septra [bactrim] and Sulfamethoxazole-trimethoprim  Review  of Systems   Review of Systems  Constitutional:  Positive for fever (101.5 Tmax). Negative for chills.  HENT:  Negative for ear pain and sore throat.   Eyes:  Negative for pain and visual disturbance.  Respiratory:  Positive for cough. Negative for shortness of breath.   Cardiovascular:  Negative for chest pain and palpitations.  Gastrointestinal:  Positive for diarrhea and nausea. Negative for abdominal pain and vomiting.  Genitourinary:  Negative for dysuria and hematuria.  Musculoskeletal:  Negative for arthralgias and back pain.  Skin:  Negative for color change and rash.  Neurological:  Positive for weakness. Negative for seizures and syncope.  All other systems reviewed and are negative.  Physical Exam Updated Vital Signs BP 131/83 (BP Location: Left Arm)   Pulse (!) 110   Temp 99.6 F (37.6 C) (Oral)   Resp 20   Ht 5' 8"  (1.727 m)   Wt 115.2 kg   SpO2 93%   BMI 38.62 kg/m   Physical Exam Vitals and nursing note reviewed.  Constitutional:      Appearance: Normal appearance.  HENT:     Head: Normocephalic and atraumatic.  Eyes:     Conjunctiva/sclera: Conjunctivae normal.  Pulmonary:     Effort: Pulmonary effort is normal. No respiratory distress.  Abdominal:     General: There is no distension.     Tenderness: There is no abdominal tenderness. There is no guarding.  Musculoskeletal:        General: No deformity. Normal range of motion.     Cervical back: Normal range of motion.  Skin:    General: Skin is warm and dry.  Neurological:     General: No focal deficit present.     Mental Status: He is alert and oriented to person, place, and time. Mental status is at baseline.  Psychiatric:        Mood and Affect: Mood normal.    ED Results / Procedures / Treatments   Labs (all labs ordered are listed, but only abnormal results are displayed) Labs Reviewed  COMPREHENSIVE METABOLIC PANEL - Abnormal; Notable for the following components:      Result Value    Sodium 123 (*)    Chloride 88 (*)    Glucose, Bld 153 (*)    Calcium 8.6 (*)    Albumin 3.3 (*)    All other components within normal limits  CBC WITH DIFFERENTIAL/PLATELET - Abnormal; Notable for the following components:   RBC 4.19 (*)    HCT 38.1 (*)    MCHC 37.0 (*)    All other components within normal limits  URINALYSIS, ROUTINE W REFLEX MICROSCOPIC - Abnormal; Notable for the following  components:   Color, Urine AMBER (*)    Hgb urine dipstick MODERATE (*)    Protein, ur 30 (*)    All other components within normal limits  D-DIMER, QUANTITATIVE - Abnormal; Notable for the following components:   D-Dimer, Quant 1.85 (*)    All other components within normal limits  URINALYSIS, MICROSCOPIC (REFLEX) - Abnormal; Notable for the following components:   Bacteria, UA FEW (*)    All other components within normal limits  RESP PANEL BY RT-PCR (FLU A&B, COVID) [id number]  TROPONIN I (HIGH SENSITIVITY)    EKG EKG Interpretation  Date/Time:  Thursday October 06 2020 15:49:29 EDT Ventricular Rate:  112 PR Interval:  181 QRS Duration: 99 QT Interval:  323 QTC Calculation: 441 R Axis:   113 Text Interpretation: Sinus tachycardia Right axis deviation Low voltage, precordial leads Similar to prior No acute ischemia Confirmed by Lorre Munroe ([id number]) on 10/06/2020 3:52:17 PM  Radiology DG Chest Port 1 View  Result Date: 10/06/2020 CLINICAL DATA:  55 year old male with history of fever and cough. Fatigue. EXAM: PORTABLE CHEST 1 VIEW COMPARISON:  Chest x-ray 01/30/2020. FINDINGS: Lung volumes are low. No consolidative airspace disease. No pleural effusions. No pneumothorax. No pulmonary nodule or mass noted. Pulmonary vasculature and the cardiomediastinal silhouette are within normal limits. IMPRESSION: 1. Low lung volumes without radiographic evidence of acute cardiopulmonary disease. Electronically Signed   By: Vinnie Langton M.D.   On: 10/06/2020 14:56    Procedures Procedures    Medications Ordered in ED Medications  sodium chloride 0.9 % bolus 500 mL (has no administration in time range)    ED Course  I have reviewed the triage vital signs and the nursing notes.  Pertinent labs & imaging results that were available during my care of the patient were reviewed by me and considered in my medical decision making (see chart for details).    MDM Rules/Calculators/A&P                           [name] presents with nonspecific symptoms of fever, weakness.  Initially I thought his symptoms were most consistent with an infectious disease with COVID high on the list of possibilities.  He will be evaluated for other causes including pneumonia, ACS.  He was noted to have hypoxia after initial triage vitals, and he was placed on 2 L of oxygent to maintain saturations. He was signed out to Dr. Armandina Gemma awaiting CTA to evaluate for PE as well as a troponin that was still pending. Final Clinical Impression(s) / ED Diagnoses Final diagnoses:  Acute respiratory failure with hypoxia [facility])    Rx / DC Orders ED Discharge Orders     None        Arnaldo Natal, MD 10/07/20 [phone]

## 2020-10-06 NOTE — ED Triage Notes (Signed)
Pt c/o fatigue, decreased appetite, diarrhea, fever x 4 days-states he was seen by PCP 2 days ago-neg covid test-NAD-steady gait

## 2020-10-06 NOTE — ED Notes (Signed)
Placed on O2 2L [place]

## 2020-10-06 NOTE — ED Notes (Signed)
CXR complete.

## 2020-10-06 NOTE — Sepsis Progress Note (Signed)
[facility] following code sepsis °

## 2020-10-07 ENCOUNTER — Encounter (HOSPITAL_COMMUNITY): Payer: Self-pay | Admitting: Family Medicine

## 2020-10-07 DIAGNOSIS — A419 Sepsis, unspecified organism: Secondary | ICD-10-CM | POA: Diagnosis not present

## 2020-10-07 DIAGNOSIS — J9601 Acute respiratory failure with hypoxia: Secondary | ICD-10-CM | POA: Diagnosis not present

## 2020-10-07 DIAGNOSIS — I10 Essential (primary) hypertension: Secondary | ICD-10-CM | POA: Diagnosis not present

## 2020-10-07 DIAGNOSIS — J189 Pneumonia, unspecified organism: Secondary | ICD-10-CM

## 2020-10-07 LAB — CBC
HCT: 36.2 % — ABNORMAL LOW (ref 39.0–52.0)
Hemoglobin: 13 g/dL (ref 13.0–17.0)
MCH: 33.2 pg (ref 26.0–34.0)
MCHC: 35.9 g/dL (ref 30.0–36.0)
MCV: 92.6 fL (ref 80.0–100.0)
Platelets: 160 10*3/uL (ref 150–400)
RBC: 3.91 MIL/uL — ABNORMAL LOW (ref 4.22–5.81)
RDW: 12 % (ref 11.5–15.5)
WBC: 9.6 10*3/uL (ref 4.0–10.5)
nRBC: 0 % (ref 0.0–0.2)

## 2020-10-07 LAB — BASIC METABOLIC PANEL
Anion gap: 11 (ref 5–15)
BUN: 11 mg/dL (ref 6–20)
CO2: 25 mmol/L (ref 22–32)
Calcium: 8.6 mg/dL — ABNORMAL LOW (ref 8.9–10.3)
Chloride: 89 mmol/L — ABNORMAL LOW (ref 98–111)
Creatinine, Ser: 1.07 mg/dL (ref 0.61–1.24)
GFR, Estimated: >60 mL/min (ref >60)
Glucose, Bld: 135 mg/dL — ABNORMAL HIGH (ref 70–99)
Potassium: 3.6 mmol/L (ref 3.5–5.1)
Sodium: 125 mmol/L — ABNORMAL LOW (ref 135–145)

## 2020-10-07 LAB — MRSA NEXT GEN BY PCR, NASAL: MRSA by PCR Next Gen: NOT DETECTED

## 2020-10-07 LAB — PHOSPHORUS: Phosphorus: 3.4 mg/dL (ref 2.5–4.6)

## 2020-10-07 LAB — MAGNESIUM: Magnesium: 1.7 mg/dL (ref 1.7–2.4)

## 2020-10-07 MED ORDER — ALLOPURINOL 300 MG PO TABS
300.0000 mg | ORAL_TABLET | Freq: Every day | ORAL | Status: DC
  Administered 2020-10-07 – 2020-10-17 (×11): 300 mg via ORAL
  Filled 2020-10-07 (×11): qty 1

## 2020-10-07 MED ORDER — VANCOMYCIN HCL IN DEXTROSE 1-5 GM/200ML-% IV SOLN
1000.0000 mg | Freq: Two times a day (BID) | INTRAVENOUS | Status: DC
  Administered 2020-10-07: 1000 mg via INTRAVENOUS
  Filled 2020-10-07 (×2): qty 200

## 2020-10-07 MED ORDER — SODIUM CHLORIDE 0.9 % IV SOLN
2.0000 g | Freq: Three times a day (TID) | INTRAVENOUS | Status: DC
  Administered 2020-10-07 – 2020-10-13 (×20): 2 g via INTRAVENOUS
  Filled 2020-10-07 (×20): qty 2

## 2020-10-07 MED ORDER — ONDANSETRON HCL 4 MG/2ML IJ SOLN
4.0000 mg | Freq: Four times a day (QID) | INTRAMUSCULAR | Status: DC | PRN
  Administered 2020-10-07 – 2020-10-08 (×2): 4 mg via INTRAVENOUS
  Filled 2020-10-07: qty 2

## 2020-10-07 MED ORDER — OXYCODONE HCL 5 MG PO TABS
5.0000 mg | ORAL_TABLET | Freq: Four times a day (QID) | ORAL | Status: AC | PRN
  Administered 2020-10-09 – 2020-10-10 (×3): 5 mg via ORAL
  Administered 2020-10-11: 10 mg via ORAL
  Filled 2020-10-07 (×2): qty 1
  Filled 2020-10-07: qty 2
  Filled 2020-10-07: qty 1

## 2020-10-07 MED ORDER — ONDANSETRON HCL 4 MG PO TABS: 4.0000 mg | ORAL_TABLET | Freq: Four times a day (QID) | ORAL | Status: DC | PRN

## 2020-10-07 MED ORDER — VANCOMYCIN HCL 1250 MG/250ML IV SOLN
1250.0000 mg | Freq: Two times a day (BID) | INTRAVENOUS | Status: DC
  Administered 2020-10-07: 1250 mg via INTRAVENOUS
  Filled 2020-10-07: qty 250

## 2020-10-07 MED ORDER — AMLODIPINE BESYLATE 10 MG PO TABS
10.0000 mg | ORAL_TABLET | Freq: Every day | ORAL | Status: DC
  Administered 2020-10-07 – 2020-10-15 (×9): 10 mg via ORAL
  Filled 2020-10-07 (×9): qty 1

## 2020-10-07 MED ORDER — POTASSIUM CHLORIDE 10 MEQ/100ML IV SOLN
10.0000 meq | INTRAVENOUS | Status: AC
  Administered 2020-10-07 (×3): 10 meq via INTRAVENOUS
  Filled 2020-10-07 (×3): qty 100

## 2020-10-07 MED ORDER — METRONIDAZOLE 500 MG/100ML IV SOLN
500.0000 mg | Freq: Two times a day (BID) | INTRAVENOUS | Status: DC
Start: 2020-10-07 — End: 2020-10-08
  Administered 2020-10-07 – 2020-10-08 (×4): 500 mg via INTRAVENOUS
  Filled 2020-10-07 (×4): qty 100

## 2020-10-07 MED ORDER — MAGNESIUM SULFATE 2 GM/50ML IV SOLN
2.0000 g | Freq: Once | INTRAVENOUS | Status: AC
  Administered 2020-10-07: 2 g via INTRAVENOUS
  Filled 2020-10-07: qty 50

## 2020-10-07 MED ORDER — ENOXAPARIN SODIUM 40 MG/0.4ML IJ SOSY
40.0000 mg | PREFILLED_SYRINGE | Freq: Every day | INTRAMUSCULAR | Status: DC
  Administered 2020-10-07 – 2020-10-17 (×11): 40 mg via SUBCUTANEOUS
  Filled 2020-10-07 (×11): qty 0.4

## 2020-10-07 MED ORDER — VANCOMYCIN HCL 2000 MG/400ML IV SOLN
2000.0000 mg | Freq: Once | INTRAVENOUS | Status: AC
  Administered 2020-10-07: 2000 mg via INTRAVENOUS
  Filled 2020-10-07: qty 400

## 2020-10-07 MED ORDER — ALUM & MAG HYDROXIDE-SIMETH 200-200-20 MG/5ML PO SUSP: 30.0000 mL | ORAL | Status: DC | PRN

## 2020-10-07 MED ORDER — ZOLPIDEM TARTRATE 5 MG PO TABS
10.0000 mg | ORAL_TABLET | Freq: Every evening | ORAL | Status: DC | PRN
  Administered 2020-10-16: 5 mg via ORAL
  Filled 2020-10-07: qty 2

## 2020-10-07 NOTE — Progress Notes (Signed)
Pharmacy Antibiotic Note  AUTHOR HATLESTAD is a 55 y.o. male admitted on 10/06/2020 with fevers and fatigue, possible sepsis.  Pharmacy has been consulted for Vancomycin and Cefepime  dosing.  Vancomycin 2 g IV given in ED at  [place]: Vancomycin 1250 mg IV q12h Cefepime 2 g IV q8h  Height: 5' 8"  (172.7 cm) Weight: 112.7 kg (248 lb 8 oz) IBW/kg (Calculated) : 68.4  Temp (24hrs), Avg:101.4 F (38.6 C), Min:99.3 F (37.4 C), Max:103.2 F (39.6 C)  Recent Labs  Lab 10/06/20 1408 10/06/20 1922 10/07/20 0252  WBC 8.3  --  9.6  CREATININE 1.08  --  1.07  LATICACIDVEN  --  1.4  --     Estimated Creatinine Clearance: 92.8 mL/min (by C-G formula based on SCr of 1.07 mg/dL).    Allergies  Allergen Reactions   Septra [Bactrim] Other (See Comments)    Very bad migraine   Sulfamethoxazole-Trimethoprim     Other reaction(s): massive headache      Caryl Pina 10/07/2020 5:33 AM

## 2020-10-07 NOTE — Progress Notes (Signed)
Dr. Myna Hidalgo notified about pt having a 14 beat run of PVC's and well as pt vomiting. Pt received Zofran 4 mg IVP as ordered. New orders received to give pt IVPB Magnesium and Potassium.

## 2020-10-07 NOTE — H&P (Signed)
History and Physical    Austin Chang HWE:[id number] DOB: [birth year] DOA: 10/06/2020  PCP: Josetta Huddle, MD   Patient coming from: Home   Chief Complaint: Fever, fatigue, loss of appetite   HPI: Austin Chang is a 55 y.o. male with medical history significant for ulcerative pancolitis on Humira, BMI 38, hypertension, and ankylosing spondylitis, now presenting to the emergency department for evaluation of fever, fatigue, and loss of appetite.  Symptoms began on 10/02/2020, seem to improve slightly the following day, but then began to worsen again.  He has had some nasal congestion, a couple loose stools which is not unusual for him, but denies any cough, shortness of breath, abdominal pain, vomiting, dysuria, flank pain, rash, wound, neck stiffness, chest pain, or headache.  He had outpatient COVID testing that was negative.  He normally takes Lasix but has not for the past 3 days.  He has not been eating much at all due to loss of appetite.  [facility] ED Course: Upon arrival to the ED, patient is found to be febrile to 39.6 C, saturating mid-90s on room air, tachycardic and tachypneic, and with stable BP. Labs notable for sodium 123, d-dimer 1.85, and procalcitonin 1.96. CTA chest negative for PE or other acute findings. Blood cultures were collected in ED and patient was treated with IV Lasix, IVF bolus, Rocephin, and azithromycin. He was transferred to [facility] for ongoing evaluation and management.   Review of Systems:  All other systems reviewed and apart from HPI, are negative.  Past Medical History:  Diagnosis Date   Ankylosing spondylitis ([place])    Arthritis    Colitis    Colitis    Diverticulosis    Hypertension    Obesity    Rectal bleeding     Past Surgical History:  Procedure Laterality Date   COLON SURGERY     COLONOSCOPY WITH PROPOFOL N/A 07/11/2016   Procedure: COLONOSCOPY WITH PROPOFOL;  Surgeon: Garlan Fair, MD;  Location: [facility] ENDOSCOPY;  Service: Endoscopy;  Laterality: N/A;    COLOSTOMY CLOSURE     ESOPHAGOGASTRODUODENOSCOPY  01/03/2011   Procedure: ESOPHAGOGASTRODUODENOSCOPY (EGD);  Surgeon: Garlan Fair, MD;  Location: [facility];  Service: Endoscopy;  Laterality: N/A;   NECK SURGERY     SPINE SURGERY     TONSILLECTOMY      Social History:   reports that he has never smoked. He has never used smokeless tobacco. He reports current alcohol use of about 5.0 standard drinks per week. He reports that he does not use drugs.  Allergies  Allergen Reactions   Septra [Bactrim] Other (See Comments)    Very bad migraine   Sulfamethoxazole-Trimethoprim     Other reaction(s): massive headache    Family History  Problem Relation Age of Onset   Heart disease Mother    Heart disease Father    Anesthesia problems Neg Hx    Hypotension Neg Hx    Malignant hyperthermia Neg Hx    Pseudochol deficiency Neg Hx    Colon cancer Neg Hx    Rectal cancer Neg Hx    Esophageal cancer Neg Hx    Liver cancer Neg Hx      Prior to Admission medications   Medication Sig Start Date End Date Taking? Authorizing Provider  acetaminophen (TYLENOL) 500 MG tablet Take 500-1,000 mg by mouth every 6 (six) hours as needed for mild pain.   Yes [provider]  allopurinol (ZYLOPRIM) 300 MG tablet Take 300 mg by mouth daily.  11/04/19  Yes [provider]  amLODipine-valsartan (EXFORGE) 10-160 MG tablet Take 1 tablet by mouth daily.   Yes [provider]  celecoxib (CELEBREX) 200 MG capsule Take 200 mg by mouth 2 (two) times daily.   Yes [provider]  Cholecalciferol (VITAMIN D) 2000 units CAPS Take 2,000 Units by mouth daily.   Yes [provider]  Colchicine 0.6 MG CAPS Take 0.6 mg by mouth daily as needed (gout flare up). 08/24/19  Yes [provider]  furosemide (LASIX) 40 MG tablet Take 40 mg by mouth daily.   Yes [provider]  indomethacin (INDOCIN SR) 75 MG CR capsule Take 75 mg by mouth daily as needed for  mild pain. For inflammation.   Yes [provider]  indomethacin (INDOCIN) 50 MG capsule Take 50 mg by mouth 2 (two) times daily with a meal.   Yes [provider]  Magnesium 300 MG CAPS Take 300 mg by mouth daily.   Yes [provider]  ondansetron (ZOFRAN-ODT) 8 MG disintegrating tablet Take 8 mg by mouth every 8 (eight) hours as needed for nausea/vomiting. 01/28/20  Yes [provider]  Potassium 99 MG TABS Take 1 tablet by mouth daily.   Yes [provider]  thiamine (VITAMIN B-1) 100 MG tablet Take 100 mg by mouth daily.   Yes [provider]  zolpidem (AMBIEN) 10 MG tablet Take 10 mg by mouth at bedtime as needed for sleep. 01/20/20  Yes [provider]  azithromycin (ZITHROMAX) 250 MG tablet Take 1 tablet po daily until finished Patient not taking: No sig reported 02/02/20   Little Ishikawa, MD  HUMIRA PEN 40 MG/0.4ML PNKT 40 mg by Subdermal route every 14 (fourteen) days. 11/25/19   [provider]    Physical Exam: Vitals:   10/06/20 2030 10/06/20 2143 10/06/20 2154 10/07/20 0010  BP: (!) 144/85     Pulse: (!) 121     Resp: (!) 28     Temp: 99.3 F (37.4 C)  (!) 103 F (39.4 C) (!) 102.8 F (39.3 C)  TempSrc: Oral  Axillary Axillary  SpO2: 95%     Weight:  112.7 kg    Height:  5' 8"  (1.727 m)      Constitutional: NAD, calm  Eyes: PERTLA, lids and conjunctivae normal ENMT: Mucous membranes are moist. Posterior pharynx clear of any exudate or lesions.   Neck: supple, no masses  Respiratory: clear to auscultation bilaterally, no wheezing, no crackles. Dyspneic with speech.   Cardiovascular: S1 & S2 heard, regular rate and rhythm. Trace b/l LE edema.   Abdomen: No distension, no tenderness, soft. Bowel sounds active.  Musculoskeletal: no clubbing / cyanosis. No joint deformity upper and lower extremities.   Skin: no significant rashes, lesions, ulcers. Warm, dry, well-perfused. Neurologic: CN 2-12  grossly intact. Sensation intact. Moving all extremities.  Psychiatric: Alert and oriented to person, place, and situation. Pleasant and cooperative.    Labs and Imaging on Admission: I have personally reviewed following labs and imaging studies  CBC: Recent Labs  Lab 10/06/20 1408  WBC 8.3  NEUTROABS 6.7  HGB 14.1  HCT 38.1*  MCV 90.9  PLT 902   Basic Metabolic Panel: Recent Labs  Lab 10/06/20 1408  NA 123*  K 4.0  CL 88*  CO2 27  GLUCOSE 153*  BUN 10  CREATININE 1.08  CALCIUM 8.6*   GFR: Estimated Creatinine Clearance: 91.9 mL/min (by C-G formula based on SCr of 1.08  mg/dL). Liver Function Tests: Recent Labs  Lab 10/06/20 1408  AST 39  ALT 30  ALKPHOS 111  BILITOT 1.1  PROT 7.3  ALBUMIN 3.3*   No results for input(s): LIPASE, AMYLASE in the last 168 hours. No results for input(s): AMMONIA in the last 168 hours. Coagulation Profile: No results for input(s): INR, PROTIME in the last 168 hours. Cardiac Enzymes: No results for input(s): CKTOTAL, CKMB, CKMBINDEX, TROPONINI in the last 168 hours. BNP (last 3 results) No results for input(s): PROBNP in the last 8760 hours. HbA1C: No results for input(s): HGBA1C in the last 72 hours. CBG: No results for input(s): GLUCAP in the last 168 hours. Lipid Profile: No results for input(s): CHOL, HDL, LDLCALC, TRIG, CHOLHDL, LDLDIRECT in the last 72 hours. Thyroid Function Tests: No results for input(s): TSH, T4TOTAL, FREET4, T3FREE, THYROIDAB in the last 72 hours. Anemia Panel: No results for input(s): VITAMINB12, FOLATE, FERRITIN, TIBC, IRON, RETICCTPCT in the last 72 hours. Urine analysis:    Component Value Date/Time   COLORURINE AMBER (A) 10/06/2020 1408   APPEARANCEUR CLEAR 10/06/2020 1408   LABSPEC 1.010 10/06/2020 1408   PHURINE 6.0 10/06/2020 1408   GLUCOSEU NEGATIVE 10/06/2020 1408   HGBUR MODERATE (A) 10/06/2020 1408   BILIRUBINUR NEGATIVE 10/06/2020 1408   KETONESUR NEGATIVE 10/06/2020 1408    PROTEINUR 30 (A) 10/06/2020 1408   NITRITE NEGATIVE 10/06/2020 1408   LEUKOCYTESUR NEGATIVE 10/06/2020 1408   Sepsis Labs: @LABRCNTIP (procalcitonin:4,lacticidven:4) ) Recent Results (from the past 240 hour(s))  Resp Panel by RT-PCR (Flu A&B, Covid) Nasopharyngeal Swab     Status: None   Collection Time: 10/06/20  2:08 PM   Specimen: Nasopharyngeal Swab; Nasopharyngeal(NP) swabs in vial transport medium  Result Value Ref Range Status   SARS Coronavirus 2 by RT PCR NEGATIVE NEGATIVE Final    Comment: (NOTE) SARS-CoV-2 target nucleic acids are NOT DETECTED.  The SARS-CoV-2 RNA is generally detectable in upper respiratory specimens during the acute phase of infection. The lowest concentration of SARS-CoV-2 viral copies this assay can detect is 138 copies/mL. A negative result does not preclude SARS-Cov-2 infection and should not be used as the sole basis for treatment or other patient management decisions. A negative result may occur with  improper specimen collection/handling, submission of specimen other than nasopharyngeal swab, presence of viral mutation(s) within the areas targeted by this assay, and inadequate number of viral copies(<138 copies/mL). A negative result must be combined with clinical observations, patient history, and epidemiological information. The expected result is Negative.  Fact Sheet for Patients:  EntrepreneurPulse.com.au  Fact Sheet for Healthcare Providers:  IncredibleEmployment.be  This test is no t yet approved or cleared by the [place] FDA and  has been authorized for detection and/or diagnosis of SARS-CoV-2 by FDA under an Emergency Use Authorization (EUA). This EUA will remain  in effect (meaning this test can be used) for the duration of the COVID-19 declaration under Section 564(b)(1) of the Act, 21 U.S.C.section 360bbb-3(b)(1), unless the authorization is terminated  or revoked sooner.        Influenza A by PCR NEGATIVE NEGATIVE Final   Influenza B by PCR NEGATIVE NEGATIVE Final    Comment: (NOTE) The Xpert Xpress SARS-CoV-2/FLU/RSV plus assay is intended as an aid in the diagnosis of influenza from Nasopharyngeal swab specimens and should not be used as a sole basis for treatment. Nasal washings and aspirates are unacceptable for Xpert Xpress SARS-CoV-2/FLU/RSV testing.  Fact Sheet for Patients: EntrepreneurPulse.com.au  Fact Sheet for Healthcare Providers: IncredibleEmployment.be  This test is not yet approved or cleared by the [place] and has been authorized for detection and/or diagnosis of SARS-CoV-2 by FDA under an Emergency Use Authorization (EUA). This EUA will remain in effect (meaning this test can be used) for the duration of the COVID-19 declaration under Section 564(b)(1) of the Act, 21 U.S.C. section 360bbb-3(b)(1), unless the authorization is terminated or revoked.  Performed at [facility], [address] [place], [place] [address]   Respiratory (~20 pathogens) panel by PCR     Status: None   Collection Time: 10/06/20  7:51 PM   Specimen: Nasopharyngeal Swab; Respiratory  Result Value Ref Range Status   Adenovirus NOT DETECTED NOT DETECTED Final   Coronavirus 229E NOT DETECTED NOT DETECTED Final    Comment: (NOTE) The Coronavirus on the Respiratory Panel, DOES NOT test for the novel  Coronavirus (2019 nCoV)    Coronavirus HKU1 NOT DETECTED NOT DETECTED Final   Coronavirus NL63 NOT DETECTED NOT DETECTED Final   Coronavirus OC43 NOT DETECTED NOT DETECTED Final   Metapneumovirus NOT DETECTED NOT DETECTED Final   Rhinovirus / Enterovirus NOT DETECTED NOT DETECTED Final   Influenza A NOT DETECTED NOT DETECTED Final   Influenza B NOT DETECTED NOT DETECTED Final   Parainfluenza Virus 1 NOT DETECTED NOT DETECTED Final   Parainfluenza Virus 2 NOT DETECTED NOT DETECTED Final   Parainfluenza Virus  3 NOT DETECTED NOT DETECTED Final   Parainfluenza Virus 4 NOT DETECTED NOT DETECTED Final   Respiratory Syncytial Virus NOT DETECTED NOT DETECTED Final   Bordetella pertussis NOT DETECTED NOT DETECTED Final   Bordetella Parapertussis NOT DETECTED NOT DETECTED Final   Chlamydophila pneumoniae NOT DETECTED NOT DETECTED Final   Mycoplasma pneumoniae NOT DETECTED NOT DETECTED Final    Comment: Performed at [facility], [address] [place], [place] [address]  Resp Panel by RT-PCR (Flu A&B, Covid) Nasopharyngeal Swab     Status: None   Collection Time: 10/06/20  7:51 PM   Specimen: Nasopharyngeal Swab; Nasopharyngeal(NP) swabs in vial transport medium  Result Value Ref Range Status   SARS Coronavirus 2 by RT PCR NEGATIVE NEGATIVE Final    Comment: (NOTE) SARS-CoV-2 target nucleic acids are NOT DETECTED.  The SARS-CoV-2 RNA is generally detectable in upper respiratory specimens during the acute phase of infection. The lowest concentration of SARS-CoV-2 viral copies this assay can detect is 138 copies/mL. A negative result does not preclude SARS-Cov-2 infection and should not be used as the sole basis for treatment or other patient management decisions. A negative result may occur with  improper specimen collection/handling, submission of specimen other than nasopharyngeal swab, presence of viral mutation(s) within the areas targeted by this assay, and inadequate number of viral copies(<138 copies/mL). A negative result must be combined with clinical observations, patient history, and epidemiological information. The expected result is Negative.  Fact Sheet for Patients:  EntrepreneurPulse.com.au  Fact Sheet for Healthcare Providers:  IncredibleEmployment.be  This test is no t yet approved or cleared by the [place] FDA and  has been authorized for detection and/or diagnosis of SARS-CoV-2 by FDA under an Emergency Use Authorization  (EUA). This EUA will remain  in effect (meaning this test can be used) for the duration of the COVID-19 declaration under Section 564(b)(1) of the Act, 21 U.S.C.section 360bbb-3(b)(1), unless the authorization is terminated  or revoked sooner.       Influenza A by PCR NEGATIVE NEGATIVE Final   Influenza B  by PCR NEGATIVE NEGATIVE Final    Comment: (NOTE) The Xpert Xpress SARS-CoV-2/FLU/RSV plus assay is intended as an aid in the diagnosis of influenza from Nasopharyngeal swab specimens and should not be used as a sole basis for treatment. Nasal washings and aspirates are unacceptable for Xpert Xpress SARS-CoV-2/FLU/RSV testing.  Fact Sheet for Patients: EntrepreneurPulse.com.au  Fact Sheet for Healthcare Providers: IncredibleEmployment.be  This test is not yet approved or cleared by the [place] FDA and has been authorized for detection and/or diagnosis of SARS-CoV-2 by FDA under an Emergency Use Authorization (EUA). This EUA will remain in effect (meaning this test can be used) for the duration of the COVID-19 declaration under Section 564(b)(1) of the Act, 21 U.S.C. section 360bbb-3(b)(1), unless the authorization is terminated or revoked.  Performed at [facility], [address] [place], [place] [address]      Radiological Exams on Admission: CT Angio Chest PE W/Cm &/Or Wo Cm  Result Date: 10/06/2020 CLINICAL DATA:  PE suspected, low/intermediate prob, positive D-dimer EXAM: CT ANGIOGRAPHY CHEST WITH CONTRAST TECHNIQUE: Multidetector CT imaging of the chest was performed using the standard protocol during bolus administration of intravenous contrast. Multiplanar CT image reconstructions and MIPs were obtained to evaluate the vascular anatomy. CONTRAST:  147m OMNIPAQUE IOHEXOL 350 MG/ML SOLN COMPARISON:  None. FINDINGS: Cardiovascular: Satisfactory opacification of the pulmonary arteries to the segmental level. No evidence  of pulmonary embolism. The thoracic aorta is normal in caliber. Normal heart size. No pericardial effusion. Mediastinum/Nodes: No enlarged mediastinal, hilar, or axillary lymph nodes. The thyroid gland appears normal. Lungs/Pleura: No pleural effusion. No pneumothorax. No mass or focal consolidation. No suspicious pulmonary nodules. Musculoskeletal: No aggressive osseous lesions. Calcification of the anterior longitudinal ligament in the thoracolumbar spine consistent with history of ankylosing spondylitis. Upper abdomen: Cholelithiasis. A couple of calcified lymph nodes are noted in the upper abdomen. Review of the MIP images confirms the above findings. IMPRESSION: No findings of pulmonary embolism. No acute focal process in the chest. Other ancillary findings as described. Electronically Signed   By: YAlbin FellingM.D.   On: 10/06/2020 16:49   DG Chest Port 1 View  Result Date: 10/06/2020 CLINICAL DATA:  55year old male with history of fever and cough. Fatigue. EXAM: PORTABLE CHEST 1 VIEW COMPARISON:  Chest x-ray 01/30/2020. FINDINGS: Lung volumes are low. No consolidative airspace disease. No pleural effusions. No pneumothorax. No pulmonary nodule or mass noted. Pulmonary vasculature and the cardiomediastinal silhouette are within normal limits. IMPRESSION: 1. Low lung volumes without radiographic evidence of acute cardiopulmonary disease. Electronically Signed   By: DVinnie LangtonM.D.   On: 10/06/2020 14:56    EKG: Independently reviewed. Sinus tachycardia, rate 112, RAD.   Assessment/Plan   1. SIRS  - Presents with 5 days of fevers, fatigue, and loss of appetite and is found to be febrile to 39.6 C, tachycardic, and tachypneic without obvious source of infection  - He had some nasal congestion and respiratory virus panel is pending but he is also at increased risk for life-threatening bacterial infection d/t Humira and will be treated with vancomycin, cefepime, and Flagyl for now while  following cultures and clinical course   2. Hyponatremia  - Serum sodium is 123 on admission in setting of hypovolemia  - Continue IVF, hold Lasix, repeat chem panel    3. Ulcerative colitis  - Patient reports this has been well-controlled on Humira    4. Hypertension  - Hold ARB initially in light increased creatinine,  continue amlodipine    DVT prophylaxis: Lovenox  Code Status: Full  Level of Care: Level of care: Telemetry Medical Family Communication: None present  Disposition Plan:  Patient is from: Home  Anticipated d/c is to: Home  Anticipated d/c date is: 10/09/20  Patient currently: Pending cultures  Consults called: None  Admission status: Inpatient     Vianne Bulls, MD [facility]  10/07/2020, 12:11 AM

## 2020-10-07 NOTE — Progress Notes (Signed)
Pharmacy Antibiotic Note  Austin Chang is a 55 y.o. male admitted on 10/06/2020 with fevers and fatigue, possible sepsis.  Pharmacy has been consulted for Vancomycin and Cefepime  dosing.    Plan: Change vanc to 1071m IV q12h (estimated AUC= 510 w/ SCr= 1.0) Cefepime 2 g IV q8h  Height: 5' 8"  (172.7 cm) Weight: 112.7 kg (248 lb 8 oz) IBW/kg (Calculated) : 68.4  Temp (24hrs), Avg:100.8 F (38.2 C), Min:98.2 F (36.8 C), Max:103.2 F (39.6 C)  Recent Labs  Lab 10/06/20 1408 10/06/20 1922 10/07/20 0252  WBC 8.3  --  9.6  CREATININE 1.08  --  1.07  LATICACIDVEN  --  1.4  --      Estimated Creatinine Clearance: 92.8 mL/min (by C-G formula based on SCr of 1.07 mg/dL).    Allergies  Allergen Reactions   Septra [Bactrim] Other (See Comments)    Very bad migraine   Sulfamethoxazole-Trimethoprim     Other reaction(s): massive headache    AHildred Laser PharmD Clinical Pharmacist **Pharmacist phone directory can now be found on [email or web address] (PW [email or web address]).  Listed under [place]

## 2020-10-07 NOTE — Evaluation (Signed)
Physical Therapy Evaluation Patient Details Name: Austin Chang MRN: [id number] DOB: 11/19/1965 Today's Date: 10/07/2020   History of Present Illness  MAHAMADOU WELTZ is an 55 y.o. male past medical history significant for ulcerative colitis on Humira BMI of 38 essential hypertension ankylosing spondylitis presents to the ED for evaluation of fever fatigue loss of appetite that began on 10/02/2020, in the ED he was found to be febrile satting 90 tachycardic and tachypneic, mild hyponatremia elevated procalcitonin CTA negative for PE.  Admitted for sepsis due to CAP.  Clinical Impression  Patient presents with generalized weakness and mild imbalance limiting independence with mobility.  Feel he will benefit from skilled PT in the acute setting to allow return home with family support and no follow up PT at d/c.     Follow Up Recommendations No PT follow up    Equipment Recommendations  None recommended by PT    Recommendations for Other Services       Precautions / Restrictions Precautions Precautions: Fall      Mobility  Bed Mobility Overal bed mobility: Modified Independent                  Transfers Overall transfer level: Needs assistance Equipment used: Ambulation equipment used Transfers: Sit to/from Stand Sit to Stand: Supervision;Min guard            Ambulation/Gait Ambulation/Gait assistance: Min guard   Assistive device: IV Pole Gait Pattern/deviations: Step-through pattern;Decreased stride length;Wide base of support     General Gait Details: able to walk in hallway with IV pole to steady him, initially wider based gait, improved some during ambulation, remained on RW throughout SpO2 dropping to 88%, back to 90% or greater with brief standing rest, HR max 128  Stairs            Wheelchair Mobility    Modified Rankin (Stroke Patients Only)       Balance Overall balance assessment: Needs assistance   Sitting balance-Leahy Scale: Good      Standing balance support: No upper extremity supported Standing balance-Leahy Scale: Fair Standing balance comment: static balance without UE support with S, with mobility needs UE assist                             Pertinent Vitals/Pain Pain Assessment: No/denies pain    Home Living Family/patient expects to be discharged to:: Private residence Living Arrangements: Spouse/significant other Available Help at Discharge: Family Type of Home: House Home Access: Stairs to enter   [employer] of Steps: 2-3 Home Layout: One level Home Equipment: Cane - single point;Other (comment) Additional Comments: home O2    Prior Function Level of Independence: Independent         Comments: was working some in person and some remote     Hand Dominance        Extremity/Trunk Assessment   Upper Extremity Assessment Upper Extremity Assessment: Generalized weakness    Lower Extremity Assessment Lower Extremity Assessment: Generalized weakness       Communication   Communication: No difficulties  Cognition Arousal/Alertness: Awake/alert Behavior During Therapy: WFL for tasks assessed/performed Overall Cognitive Status: Within Functional Limits for tasks assessed                                        General Comments General comments (skin integrity,  edema, etc.): Patient somewhat SOB and cues for pursed lip breathing, was hot to touch and reports had Tylenol about 10 min prior.    Exercises     Assessment/Plan    PT Assessment Patient needs continued PT services  PT Problem List Decreased strength;Decreased mobility;Decreased balance;Decreased activity tolerance;Decreased knowledge of use of DME       PT Treatment Interventions DME instruction;Therapeutic activities;Modalities;Patient/family education;Therapeutic exercise;Stair training;Balance training;Functional mobility training    PT Goals (Current goals can be found in the  Care Plan section)  Acute Rehab PT Goals Patient Stated Goal: to get OOB PT Goal Formulation: With patient Time For Goal Achievement: 10/21/20 Potential to Achieve Goals: Good    Frequency Min 3X/week   Barriers to discharge        Co-evaluation               AM-PAC PT "6 Clicks" Mobility  Outcome Measure Help needed turning from your back to your side while in a flat bed without using bedrails?: None Help needed moving from lying on your back to sitting on the side of a flat bed without using bedrails?: A Little Help needed moving to and from a bed to a chair (including a wheelchair)?: A Little Help needed standing up from a chair using your arms (e.g., wheelchair or bedside chair)?: None Help needed to walk in hospital room?: A Little Help needed climbing 3-5 steps with a railing? : A Little 6 Click Score: 20    End of Session   Activity Tolerance: Patient tolerated treatment well Patient left: in chair;with call bell/phone within reach   PT Visit Diagnosis: Other abnormalities of gait and mobility (R26.89);Muscle weakness (generalized) (M62.81)    Time: 7505-1833 PT Time Calculation (min) (ACUTE ONLY): 28 min   Charges:   PT Evaluation $PT Eval Moderate Complexity: 1 Mod PT Treatments $Gait Training: 8-22 mins        Magda Kiel, PT Acute Rehabilitation Services POIPP:[phone] Office:[phone] 10/07/2020   Reginia Naas 10/07/2020, 4:09 PM

## 2020-10-07 NOTE — Progress Notes (Signed)
[facility] PROGRESS NOTE    Progress Note  [name]  DJM:[id number] DOB: May 08, 1965 DOA: 10/06/2020 PCP: Josetta Huddle, MD     Brief Narrative:   Austin Chang is an 55 y.o. male past medical history significant for ulcerative colitis on Humira BMI of 38 essential hypertension ankylosing spondylitis presents to the ED for evaluation of fever fatigue loss of appetite that began on 10/02/2020, in the ED he was found to be febrile satting 90 tachycardic and tachypneic, mild hyponatremia elevated procalcitonin CTA negative for PE    Assessment/Plan:   Sepsis due to community-acquired pneumonia: Hypoxia fever, CT angio of the chest was negative for PE but did show some mild diffuse infiltrates. He is on immunosuppressive therapy. Agree with empiric antibiotic coverage. Blood cultures have been sent. Vitals are stable except for persistent hypoxia currently requiring 2 L of oxygen.  Hypovolemic hyponatremia: 125 on admission, was started on normal saline sodium is slowly improving. Continue to hold Lasix repeat a basic metabolic panel tomorrow morning.  Ulcerative colitis: Resume Humira as an outpatient.  Essential hypertension continue to hold antihypertensive medication.     DVT prophylaxis: lovenox Family Communication:none Status is: Inpatient  Remains inpatient appropriate because:Hemodynamically unstable  Dispo: The patient is from: Home              Anticipated d/c is to: Home              Patient currently is not medically stable to d/c.   Difficult to place patient No        Code Status:     Code Status Orders  (From admission, onward)           Start     Ordered   10/07/20 0008  Full code  Continuous        10/07/20 0010           Code Status History     Date Active Date Inactive Code Status Order ID Comments User Context   01/31/2020 2150 02/02/2020 1921 Full Code [id number]  Doran Heater, DO Inpatient         IV Access:    Peripheral IV   Procedures and diagnostic studies:   CT Angio Chest PE W/Cm &/Or Wo Cm  Result Date: 10/06/2020 CLINICAL DATA:  PE suspected, low/intermediate prob, positive D-dimer EXAM: CT ANGIOGRAPHY CHEST WITH CONTRAST TECHNIQUE: Multidetector CT imaging of the chest was performed using the standard protocol during bolus administration of intravenous contrast. Multiplanar CT image reconstructions and MIPs were obtained to evaluate the vascular anatomy. CONTRAST:  114m OMNIPAQUE IOHEXOL 350 MG/ML SOLN COMPARISON:  None. FINDINGS: Cardiovascular: Satisfactory opacification of the pulmonary arteries to the segmental level. No evidence of pulmonary embolism. The thoracic aorta is normal in caliber. Normal heart size. No pericardial effusion. Mediastinum/Nodes: No enlarged mediastinal, hilar, or axillary lymph nodes. The thyroid gland appears normal. Lungs/Pleura: No pleural effusion. No pneumothorax. No mass or focal consolidation. No suspicious pulmonary nodules. Musculoskeletal: No aggressive osseous lesions. Calcification of the anterior longitudinal ligament in the thoracolumbar spine consistent with history of ankylosing spondylitis. Upper abdomen: Cholelithiasis. A couple of calcified lymph nodes are noted in the upper abdomen. Review of the MIP images confirms the above findings. IMPRESSION: No findings of pulmonary embolism. No acute focal process in the chest. Other ancillary findings as described. Electronically Signed   By: YAlbin FellingM.D.   On: 10/06/2020 16:49   DG Chest Port 1 View  Result Date: 10/06/2020  CLINICAL DATA:  55 year old male with history of fever and cough. Fatigue. EXAM: PORTABLE CHEST 1 VIEW COMPARISON:  Chest x-ray 01/30/2020. FINDINGS: Lung volumes are low. No consolidative airspace disease. No pleural effusions. No pneumothorax. No pulmonary nodule or mass noted. Pulmonary vasculature and the cardiomediastinal silhouette are within normal limits. IMPRESSION: 1. Low  lung volumes without radiographic evidence of acute cardiopulmonary disease. Electronically Signed   By: Vinnie Langton M.D.   On: 10/06/2020 14:56     Medical Consultants:   None.   Subjective:     Austin Chang patient still feels tired and fatigue  Objective:    Vitals:   10/07/20 0055 10/07/20 0155 10/07/20 0255 10/07/20 0554  BP:    121/75  Pulse:    (!) 103  Resp:    (!) 23  Temp: (!) 102 F (38.9 C) 100 F (37.8 C) 99.7 F (37.6 C) 98.2 F (36.8 C)  TempSrc: Oral Oral Oral Oral  SpO2:    99%  Weight:      Height:       SpO2: 99 % O2 Flow Rate (L/min): 2 L/min   Intake/Output Summary (Last 24 hours) at 10/07/2020 8299 Last data filed at 10/07/2020 0603 Gross per 24 hour  Intake 1039.97 ml  Output 2500 ml  Net -1460.03 ml   Filed Weights   10/06/20 1300 10/06/20 2143  Weight: 115.2 kg 112.7 kg    Exam: General exam: In no acute distress. Respiratory system: Good air movement and left sided crackles Cardiovascular system: S1 & S2 heard, RRR. No JVD. Gastrointestinal system: Abdomen is nondistended, soft and nontender.  Extremities: No pedal edema. Skin: No rashes, lesions or ulcers Psychiatry: Judgement and insight appear normal. Mood & affect appropriate.    Data Reviewed:    Labs: Basic Metabolic Panel: Recent Labs  Lab 10/06/20 1408 10/07/20 0252  NA 123* 125*  K 4.0 3.6  CL 88* 89*  CO2 27 25  GLUCOSE 153* 135*  BUN 10 11  CREATININE 1.08 1.07  CALCIUM 8.6* 8.6*  MG  --  1.7  PHOS  --  3.4   GFR Estimated Creatinine Clearance: 92.8 mL/min (by C-G formula based on SCr of 1.07 mg/dL). Liver Function Tests: Recent Labs  Lab 10/06/20 1408  AST 39  ALT 30  ALKPHOS 111  BILITOT 1.1  PROT 7.3  ALBUMIN 3.3*   No results for input(s): LIPASE, AMYLASE in the last 168 hours. No results for input(s): AMMONIA in the last 168 hours. Coagulation profile No results for input(s): INR, PROTIME in the last 168 hours. COVID-19  Labs  Recent Labs    10/06/20 1459  DDIMER 1.85*    Lab Results  Component Value Date   SARSCOV2NAA NEGATIVE 10/06/2020   SARSCOV2NAA NEGATIVE 10/06/2020   SARSCOV2NAA POSITIVE (A) 01/30/2020    CBC: Recent Labs  Lab 10/06/20 1408 10/07/20 0252  WBC 8.3 9.6  NEUTROABS 6.7  --   HGB 14.1 13.0  HCT 38.1* 36.2*  MCV 90.9 92.6  PLT 153 160   Cardiac Enzymes: No results for input(s): CKTOTAL, CKMB, CKMBINDEX, TROPONINI in the last 168 hours. BNP (last 3 results) No results for input(s): PROBNP in the last 8760 hours. CBG: No results for input(s): GLUCAP in the last 168 hours. D-Dimer: Recent Labs    10/06/20 1459  DDIMER 1.85*   Hgb A1c: No results for input(s): HGBA1C in the last 72 hours. Lipid Profile: No results for input(s): CHOL, HDL, LDLCALC, TRIG, CHOLHDL, LDLDIRECT in  the last 72 hours. Thyroid function studies: No results for input(s): TSH, T4TOTAL, T3FREE, THYROIDAB in the last 72 hours.  Invalid input(s): FREET3 Anemia work up: No results for input(s): VITAMINB12, FOLATE, FERRITIN, TIBC, IRON, RETICCTPCT in the last 72 hours. Sepsis Labs: Recent Labs  Lab 10/06/20 1408 10/06/20 1820 10/06/20 1922 10/07/20 0252  PROCALCITON  --  1.96  --   --   WBC 8.3  --   --  9.6  LATICACIDVEN  --   --  1.4  --    Microbiology Recent Results (from the past 240 hour(s))  Resp Panel by RT-PCR (Flu A&B, Covid) Nasopharyngeal Swab     Status: None   Collection Time: 10/06/20  2:08 PM   Specimen: Nasopharyngeal Swab; Nasopharyngeal(NP) swabs in vial transport medium  Result Value Ref Range Status   SARS Coronavirus 2 by RT PCR NEGATIVE NEGATIVE Final    Comment: (NOTE) SARS-CoV-2 target nucleic acids are NOT DETECTED.  The SARS-CoV-2 RNA is generally detectable in upper respiratory specimens during the acute phase of infection. The lowest concentration of SARS-CoV-2 viral copies this assay can detect is 138 copies/mL. A negative result does not preclude  SARS-Cov-2 infection and should not be used as the sole basis for treatment or other patient management decisions. A negative result may occur with  improper specimen collection/handling, submission of specimen other than nasopharyngeal swab, presence of viral mutation(s) within the areas targeted by this assay, and inadequate number of viral copies(<138 copies/mL). A negative result must be combined with clinical observations, patient history, and epidemiological information. The expected result is Negative.  Fact Sheet for Patients:  EntrepreneurPulse.com.au  Fact Sheet for Healthcare Providers:  IncredibleEmployment.be  This test is no t yet approved or cleared by the [place] FDA and  has been authorized for detection and/or diagnosis of SARS-CoV-2 by FDA under an Emergency Use Authorization (EUA). This EUA will remain  in effect (meaning this test can be used) for the duration of the COVID-19 declaration under Section 564(b)(1) of the Act, 21 U.S.C.section 360bbb-3(b)(1), unless the authorization is terminated  or revoked sooner.       Influenza A by PCR NEGATIVE NEGATIVE Final   Influenza B by PCR NEGATIVE NEGATIVE Final    Comment: (NOTE) The Xpert Xpress SARS-CoV-2/FLU/RSV plus assay is intended as an aid in the diagnosis of influenza from Nasopharyngeal swab specimens and should not be used as a sole basis for treatment. Nasal washings and aspirates are unacceptable for Xpert Xpress SARS-CoV-2/FLU/RSV testing.  Fact Sheet for Patients: EntrepreneurPulse.com.au  Fact Sheet for Healthcare Providers: IncredibleEmployment.be  This test is not yet approved or cleared by the [place] FDA and has been authorized for detection and/or diagnosis of SARS-CoV-2 by FDA under an Emergency Use Authorization (EUA). This EUA will remain in effect (meaning this test can be used) for the duration of  the COVID-19 declaration under Section 564(b)(1) of the Act, 21 U.S.C. section 360bbb-3(b)(1), unless the authorization is terminated or revoked.  Performed at [facility], [address] [place], [place] [address]   Respiratory (~20 pathogens) panel by PCR     Status: None   Collection Time: 10/06/20  7:51 PM   Specimen: Nasopharyngeal Swab; Respiratory  Result Value Ref Range Status   Adenovirus NOT DETECTED NOT DETECTED Final   Coronavirus 229E NOT DETECTED NOT DETECTED Final    Comment: (NOTE) The Coronavirus on the Respiratory Panel, DOES NOT test for the novel  Coronavirus (2019 nCoV)  Coronavirus HKU1 NOT DETECTED NOT DETECTED Final   Coronavirus NL63 NOT DETECTED NOT DETECTED Final   Coronavirus OC43 NOT DETECTED NOT DETECTED Final   Metapneumovirus NOT DETECTED NOT DETECTED Final   Rhinovirus / Enterovirus NOT DETECTED NOT DETECTED Final   Influenza A NOT DETECTED NOT DETECTED Final   Influenza B NOT DETECTED NOT DETECTED Final   Parainfluenza Virus 1 NOT DETECTED NOT DETECTED Final   Parainfluenza Virus 2 NOT DETECTED NOT DETECTED Final   Parainfluenza Virus 3 NOT DETECTED NOT DETECTED Final   Parainfluenza Virus 4 NOT DETECTED NOT DETECTED Final   Respiratory Syncytial Virus NOT DETECTED NOT DETECTED Final   Bordetella pertussis NOT DETECTED NOT DETECTED Final   Bordetella Parapertussis NOT DETECTED NOT DETECTED Final   Chlamydophila pneumoniae NOT DETECTED NOT DETECTED Final   Mycoplasma pneumoniae NOT DETECTED NOT DETECTED Final    Comment: Performed at [facility], [address], [place], [place] [address]  Resp Panel by RT-PCR (Flu A&B, Covid) Nasopharyngeal Swab     Status: None   Collection Time: 10/06/20  7:51 PM   Specimen: Nasopharyngeal Swab; Nasopharyngeal(NP) swabs in vial transport medium  Result Value Ref Range Status   SARS Coronavirus 2 by RT PCR NEGATIVE NEGATIVE Final    Comment: (NOTE) SARS-CoV-2 target nucleic acids are  NOT DETECTED.  The SARS-CoV-2 RNA is generally detectable in upper respiratory specimens during the acute phase of infection. The lowest concentration of SARS-CoV-2 viral copies this assay can detect is 138 copies/mL. A negative result does not preclude SARS-Cov-2 infection and should not be used as the sole basis for treatment or other patient management decisions. A negative result may occur with  improper specimen collection/handling, submission of specimen other than nasopharyngeal swab, presence of viral mutation(s) within the areas targeted by this assay, and inadequate number of viral copies(<138 copies/mL). A negative result must be combined with clinical observations, patient history, and epidemiological information. The expected result is Negative.  Fact Sheet for Patients:  EntrepreneurPulse.com.au  Fact Sheet for Healthcare Providers:  IncredibleEmployment.be  This test is no t yet approved or cleared by the [place] FDA and  has been authorized for detection and/or diagnosis of SARS-CoV-2 by FDA under an Emergency Use Authorization (EUA). This EUA will remain  in effect (meaning this test can be used) for the duration of the COVID-19 declaration under Section 564(b)(1) of the Act, 21 U.S.C.section 360bbb-3(b)(1), unless the authorization is terminated  or revoked sooner.       Influenza A by PCR NEGATIVE NEGATIVE Final   Influenza B by PCR NEGATIVE NEGATIVE Final    Comment: (NOTE) The Xpert Xpress SARS-CoV-2/FLU/RSV plus assay is intended as an aid in the diagnosis of influenza from Nasopharyngeal swab specimens and should not be used as a sole basis for treatment. Nasal washings and aspirates are unacceptable for Xpert Xpress SARS-CoV-2/FLU/RSV testing.  Fact Sheet for Patients: EntrepreneurPulse.com.au  Fact Sheet for Healthcare Providers: IncredibleEmployment.be  This test is not  yet approved or cleared by the [place] FDA and has been authorized for detection and/or diagnosis of SARS-CoV-2 by FDA under an Emergency Use Authorization (EUA). This EUA will remain in effect (meaning this test can be used) for the duration of the COVID-19 declaration under Section 564(b)(1) of the Act, 21 U.S.C. section 360bbb-3(b)(1), unless the authorization is terminated or revoked.  Performed at [facility], [address], [place], [place] [address]      Medications:  allopurinol  300 mg Oral Daily   amLODipine  10 mg Oral Daily   enoxaparin (LOVENOX) injection  40 mg Subcutaneous Daily   Continuous Infusions:  ceFEPime (MAXIPIME) IV 2 g (10/07/20 0552)   lactated ringers 150 mL/hr at 10/07/20 0020   metronidazole 500 mg (10/07/20 0111)   potassium chloride 10 mEq (10/07/20 0706)   vancomycin        LOS: 1 day   Charlynne Cousins  [facility]  10/07/2020, 7:12 AM

## 2020-10-08 DIAGNOSIS — A419 Sepsis, unspecified organism: Secondary | ICD-10-CM | POA: Diagnosis not present

## 2020-10-08 DIAGNOSIS — I10 Essential (primary) hypertension: Secondary | ICD-10-CM | POA: Diagnosis not present

## 2020-10-08 DIAGNOSIS — J9601 Acute respiratory failure with hypoxia: Secondary | ICD-10-CM | POA: Diagnosis not present

## 2020-10-08 DIAGNOSIS — J189 Pneumonia, unspecified organism: Secondary | ICD-10-CM | POA: Diagnosis not present

## 2020-10-08 LAB — BASIC METABOLIC PANEL
Anion gap: 9 (ref 5–15)
BUN: 14 mg/dL (ref 6–20)
CO2: 26 mmol/L (ref 22–32)
Calcium: 9.1 mg/dL (ref 8.9–10.3)
Chloride: 92 mmol/L — ABNORMAL LOW (ref 98–111)
Creatinine, Ser: 1.13 mg/dL (ref 0.61–1.24)
GFR, Estimated: >60 mL/min (ref >60)
Glucose, Bld: 129 mg/dL — ABNORMAL HIGH (ref 70–99)
Potassium: 4.1 mmol/L (ref 3.5–5.1)
Sodium: 127 mmol/L — ABNORMAL LOW (ref 135–145)

## 2020-10-08 LAB — CBC WITH DIFFERENTIAL/PLATELET
Abs Immature Granulocytes: 0.06 10*3/uL (ref 0.00–0.07)
Basophils Absolute: 0 10*3/uL (ref 0.0–0.1)
Basophils Relative: 0 %
Eosinophils Absolute: 0 10*3/uL (ref 0.0–0.5)
Eosinophils Relative: 0 %
HCT: 38.5 % — ABNORMAL LOW (ref 39.0–52.0)
Hemoglobin: 13.6 g/dL (ref 13.0–17.0)
Immature Granulocytes: 1 %
Lymphocytes Relative: 10 %
Lymphs Abs: 1.2 10*3/uL (ref 0.7–4.0)
MCH: 33.3 pg (ref 26.0–34.0)
MCHC: 35.3 g/dL (ref 30.0–36.0)
MCV: 94.1 fL (ref 80.0–100.0)
Monocytes Absolute: 1.3 10*3/uL — ABNORMAL HIGH (ref 0.1–1.0)
Monocytes Relative: 11 %
Neutro Abs: 9.2 10*3/uL — ABNORMAL HIGH (ref 1.7–7.7)
Neutrophils Relative %: 78 %
Platelets: 174 10*3/uL (ref 150–400)
RBC: 4.09 MIL/uL — ABNORMAL LOW (ref 4.22–5.81)
RDW: 12.2 % (ref 11.5–15.5)
WBC: 11.8 10*3/uL — ABNORMAL HIGH (ref 4.0–10.5)
nRBC: 0 % (ref 0.0–0.2)

## 2020-10-08 LAB — CBC

## 2020-10-08 LAB — PROCALCITONIN: Procalcitonin: 10.81 ng/mL

## 2020-10-08 LAB — MYCOPLASMA PNEUMONIAE ANTIBODY, IGM: Mycoplasma pneumo IgM: <770 U/mL (ref 0–769)

## 2020-10-08 MED ORDER — SODIUM CHLORIDE 0.9 % IV SOLN
100.0000 mg | Freq: Once | INTRAVENOUS | Status: AC
  Administered 2020-10-08: 100 mg via INTRAVENOUS
  Filled 2020-10-08: qty 100

## 2020-10-08 MED ORDER — DOXYCYCLINE HYCLATE 100 MG PO TABS
100.0000 mg | ORAL_TABLET | Freq: Two times a day (BID) | ORAL | Status: DC
  Administered 2020-10-08 – 2020-10-09 (×2): 100 mg via ORAL
  Filled 2020-10-08 (×2): qty 1

## 2020-10-08 MED ORDER — SODIUM CHLORIDE 0.9 % IV SOLN: INTRAVENOUS | Status: DC | PRN

## 2020-10-08 NOTE — Progress Notes (Addendum)
Pharmacy Antibiotic Note  Austin Chang is a 55 y.o. male admitted on 10/06/2020 with fevers and fatigue, possible sepsis.  Pharmacy consulted for Vancomycin and Cefepime dosing on 9/9.    Per MD on 9/10, given patient's continuing fevers and immunosuppressive state, will adjust antibiotics regimen and add coverage for atypicals.    Plan: Stop Flagyl and Vancomycin Continue Cefepime 2g IV q8h Start Doxycycline 100 mg IV x 1, followed by 100 mg PO q12h Monitor clinical status, renal function, CBC F/u cultures, abx plan, LOT   Height: 5' 8"  (172.7 cm) Weight: 117.1 kg (258 lb 3.2 oz) IBW/kg (Calculated) : 68.4  Temp (24hrs), Avg:100.2 F (37.9 C), Min:98.3 F (36.8 C), Max:102.4 F (39.1 C)  Recent Labs  Lab 10/06/20 1408 10/06/20 1922 10/07/20 0252 10/08/20 0245  WBC 8.3  --  9.6 11.6*  CREATININE 1.08  --  1.07 1.13  LATICACIDVEN  --  1.4  --   --     Estimated Creatinine Clearance: 89.7 mL/min (by C-G formula based on SCr of 1.13 mg/dL).    Allergies  Allergen Reactions   Septra [Bactrim] Other (See Comments)    Very bad migraine   Sulfamethoxazole-Trimethoprim     Other reaction(s): massive headache    Antimicrobials this admission: Doxycycline 9/10 >>  Cefepime 9/9 >> Vancomycin 9/9 >> 9/10 Flagyl 9/9 >> 9/10 Ceftriaxone 9/8 x 1  Dose adjustments this admission:   Microbiology results: 9/8 BCx: ngtd 9/9 MRSA PCR: not detected 9/9 Resp panel PCR: not detected    Thank you for allowing pharmacy to be a part of this patient's care.  Vance Peper, PharmD PGY1 Pharmacy Resident Phone [phone] 10/08/2020 10:45 AM   Please check AMION for all [facility] phone numbers After 10:00 PM, call [place] [phone]

## 2020-10-08 NOTE — Progress Notes (Signed)
[facility] PROGRESS NOTE    Progress Note  CANDICE LUNNEY  IOX:[id number] DOB: [birth year] DOA: 10/06/2020 PCP: Josetta Huddle, MD     Brief Narrative:   Austin Chang is an 55 y.o. male past medical history significant for ulcerative colitis on Humira BMI of 38 essential hypertension ankylosing spondylitis presents to the ED for evaluation of fever fatigue loss of appetite that began on 10/02/2020, in the ED he was found to be febrile satting 90 tachycardic and tachypneic, mild hyponatremia elevated procalcitonin CTA negative for PE, positive for pneumonia.  Having fevers and leukocytosis.   Assessment/Plan:   Sepsis due to community-acquired pneumonia: He is on immunosuppressive therapy, currently on IV vancomycin and cefepime. He continues to spike fevers, this could be a delayed reaction due to his immunosuppressive state. Blood cultures remain negative to date. Is currently requiring 2 L of oxygen, to keep saturations greater 90%.  Hypovolemic hyponatremia: Likely due to i pneumonic process and and hypovolemia improved. Recheck a basic metabolic panel tomorrow morning.  Ulcerative colitis: Resume Humira as an outpatient.  Essential hypertension: Continue to hold antihypertensive medication.   DVT prophylaxis: lovenox Family Communication:none Status is: Inpatient  Remains inpatient appropriate because:Hemodynamically unstable  Dispo: The patient is from: Home              Anticipated d/c is to: Home              Patient currently is not medically stable to d/c.   Difficult to place patient No        Code Status:     Code Status Orders  (From admission, onward)           Start     Ordered   10/07/20 0008  Full code  Continuous        10/07/20 0010           Code Status History     Date Active Date Inactive Code Status Order ID Comments User Context   01/31/2020 2150 02/02/2020 1921 Full Code [id number]  Doran Heater, DO Inpatient          IV Access:   Peripheral IV   Procedures and diagnostic studies:   CT Angio Chest PE W/Cm &/Or Wo Cm  Result Date: 10/06/2020 CLINICAL DATA:  PE suspected, low/intermediate prob, positive D-dimer EXAM: CT ANGIOGRAPHY CHEST WITH CONTRAST TECHNIQUE: Multidetector CT imaging of the chest was performed using the standard protocol during bolus administration of intravenous contrast. Multiplanar CT image reconstructions and MIPs were obtained to evaluate the vascular anatomy. CONTRAST:  187m OMNIPAQUE IOHEXOL 350 MG/ML SOLN COMPARISON:  None. FINDINGS: Cardiovascular: Satisfactory opacification of the pulmonary arteries to the segmental level. No evidence of pulmonary embolism. The thoracic aorta is normal in caliber. Normal heart size. No pericardial effusion. Mediastinum/Nodes: No enlarged mediastinal, hilar, or axillary lymph nodes. The thyroid gland appears normal. Lungs/Pleura: No pleural effusion. No pneumothorax. No mass or focal consolidation. No suspicious pulmonary nodules. Musculoskeletal: No aggressive osseous lesions. Calcification of the anterior longitudinal ligament in the thoracolumbar spine consistent with history of ankylosing spondylitis. Upper abdomen: Cholelithiasis. A couple of calcified lymph nodes are noted in the upper abdomen. Review of the MIP images confirms the above findings. IMPRESSION: No findings of pulmonary embolism. No acute focal process in the chest. Other ancillary findings as described. Electronically Signed   By: YAlbin FellingM.D.   On: 10/06/2020 16:49   DG Chest Port 1 View  Result Date: 10/06/2020 CLINICAL DATA:  55 year old male with history of fever and cough. Fatigue. EXAM: PORTABLE CHEST 1 VIEW COMPARISON:  Chest x-ray 01/30/2020. FINDINGS: Lung volumes are low. No consolidative airspace disease. No pleural effusions. No pneumothorax. No pulmonary nodule or mass noted. Pulmonary vasculature and the cardiomediastinal silhouette are within normal  limits. IMPRESSION: 1. Low lung volumes without radiographic evidence of acute cardiopulmonary disease. Electronically Signed   By: Vinnie Langton M.D.   On: 10/06/2020 14:56     Medical Consultants:   None.   Subjective:     [name] patient still feels started spiking fevers.  Objective:    Vitals:   10/07/20 2113 10/08/20 0043 10/08/20 0400 10/08/20 0809  BP: 131/69 121/67 126/64 136/78  Pulse: (!) 113 (!) 106  (!) 108  Resp: 20 (!) 30 20 (!) 28  Temp: 98.5 F (36.9 C) 99.2 F (37.3 C) 100.1 F (37.8 C) (!) 102.4 F (39.1 C)  TempSrc: Oral Oral Oral Axillary  SpO2:  91%  99%  Weight:   117.1 kg   Height:       SpO2: 99 % O2 Flow Rate (L/min): 2 L/min   Intake/Output Summary (Last 24 hours) at 10/08/2020 0933 Last data filed at 10/08/2020 0400 Gross per 24 hour  Intake 735.51 ml  Output 200 ml  Net 535.51 ml    Filed Weights   10/06/20 1300 10/06/20 2143 10/08/20 0400  Weight: 115.2 kg 112.7 kg 117.1 kg    Exam: General exam: In no acute distress. Respiratory system: Good air movement and bilateral crackles Cardiovascular system: S1 & S2 heard, RRR. No JVD. Gastrointestinal system: Abdomen is nondistended, soft and nontender.  Extremities: 2+ edema Skin: No rashes, lesions or ulcers Psychiatry: Judgement and insight appear normal. Mood & affect appropriate.   Data Reviewed:    Labs: Basic Metabolic Panel: Recent Labs  Lab 10/06/20 1408 10/07/20 0252 10/08/20 0245  NA 123* 125* 127*  K 4.0 3.6 4.1  CL 88* 89* 92*  CO2 27 25 26   GLUCOSE 153* 135* 129*  BUN 10 11 14   CREATININE 1.08 1.07 1.13  CALCIUM 8.6* 8.6* 9.1  MG  --  1.7  --   PHOS  --  3.4  --     GFR Estimated Creatinine Clearance: 89.7 mL/min (by C-G formula based on SCr of 1.13 mg/dL). Liver Function Tests: Recent Labs  Lab 10/06/20 1408  AST 39  ALT 30  ALKPHOS 111  BILITOT 1.1  PROT 7.3  ALBUMIN 3.3*    No results for input(s): LIPASE, AMYLASE in the last  168 hours. No results for input(s): AMMONIA in the last 168 hours. Coagulation profile No results for input(s): INR, PROTIME in the last 168 hours. COVID-19 Labs  Recent Labs    10/06/20 1459  DDIMER 1.85*     Lab Results  Component Value Date   SARSCOV2NAA NEGATIVE 10/06/2020   SARSCOV2NAA NEGATIVE 10/06/2020   SARSCOV2NAA POSITIVE (A) 01/30/2020    CBC: Recent Labs  Lab 10/06/20 1408 10/07/20 0252 10/08/20 0245  WBC 8.3 9.6 11.6*  NEUTROABS 6.7  --   --   HGB 14.1 13.0 13.5  HCT 38.1* 36.2* 37.4*  MCV 90.9 92.6 93.3  PLT 153 160 172    Cardiac Enzymes: No results for input(s): CKTOTAL, CKMB, CKMBINDEX, TROPONINI in the last 168 hours. BNP (last 3 results) No results for input(s): PROBNP in the last 8760 hours. CBG: No results for input(s): GLUCAP in the last 168 hours. D-Dimer: Recent Labs  10/06/20 1459  DDIMER 1.85*    Hgb A1c: No results for input(s): HGBA1C in the last 72 hours. Lipid Profile: No results for input(s): CHOL, HDL, LDLCALC, TRIG, CHOLHDL, LDLDIRECT in the last 72 hours. Thyroid function studies: No results for input(s): TSH, T4TOTAL, T3FREE, THYROIDAB in the last 72 hours.  Invalid input(s): FREET3 Anemia work up: No results for input(s): VITAMINB12, FOLATE, FERRITIN, TIBC, IRON, RETICCTPCT in the last 72 hours. Sepsis Labs: Recent Labs  Lab 10/06/20 1408 10/06/20 1820 10/06/20 1922 10/07/20 0252 10/08/20 0245  PROCALCITON  --  1.96  --   --  10.81  WBC 8.3  --   --  9.6 11.6*  LATICACIDVEN  --   --  1.4  --   --     Microbiology Recent Results (from the past 240 hour(s))  Resp Panel by RT-PCR (Flu A&B, Covid) Nasopharyngeal Swab     Status: None   Collection Time: 10/06/20  2:08 PM   Specimen: Nasopharyngeal Swab; Nasopharyngeal(NP) swabs in vial transport medium  Result Value Ref Range Status   SARS Coronavirus 2 by RT PCR NEGATIVE NEGATIVE Final    Comment: (NOTE) SARS-CoV-2 target nucleic acids are NOT  DETECTED.  The SARS-CoV-2 RNA is generally detectable in upper respiratory specimens during the acute phase of infection. The lowest concentration of SARS-CoV-2 viral copies this assay can detect is 138 copies/mL. A negative result does not preclude SARS-Cov-2 infection and should not be used as the sole basis for treatment or other patient management decisions. A negative result may occur with  improper specimen collection/handling, submission of specimen other than nasopharyngeal swab, presence of viral mutation(s) within the areas targeted by this assay, and inadequate number of viral copies(<138 copies/mL). A negative result must be combined with clinical observations, patient history, and epidemiological information. The expected result is Negative.  Fact Sheet for Patients:  EntrepreneurPulse.com.au  Fact Sheet for Healthcare Providers:  IncredibleEmployment.be  This test is no t yet approved or cleared by the [place] FDA and  has been authorized for detection and/or diagnosis of SARS-CoV-2 by FDA under an Emergency Use Authorization (EUA). This EUA will remain  in effect (meaning this test can be used) for the duration of the COVID-19 declaration under Section 564(b)(1) of the Act, 21 U.S.C.section 360bbb-3(b)(1), unless the authorization is terminated  or revoked sooner.       Influenza A by PCR NEGATIVE NEGATIVE Final   Influenza B by PCR NEGATIVE NEGATIVE Final    Comment: (NOTE) The Xpert Xpress SARS-CoV-2/FLU/RSV plus assay is intended as an aid in the diagnosis of influenza from Nasopharyngeal swab specimens and should not be used as a sole basis for treatment. Nasal washings and aspirates are unacceptable for Xpert Xpress SARS-CoV-2/FLU/RSV testing.  Fact Sheet for Patients: EntrepreneurPulse.com.au  Fact Sheet for Healthcare Providers: IncredibleEmployment.be  This test is not yet  approved or cleared by the [place] FDA and has been authorized for detection and/or diagnosis of SARS-CoV-2 by FDA under an Emergency Use Authorization (EUA). This EUA will remain in effect (meaning this test can be used) for the duration of the COVID-19 declaration under Section 564(b)(1) of the Act, 21 U.S.C. section 360bbb-3(b)(1), unless the authorization is terminated or revoked.  Performed at [facility], [address] [place], [place] [address]   Blood culture (routine x 2)     Status: None (Preliminary result)   Collection Time: 10/06/20  7:10 PM   Specimen: Right Antecubital; Blood  Result Value  Ref Range Status   Specimen Description   Final    RIGHT ANTECUBITAL Performed at [facility], [address] [place], [place] [address]    Special Requests   Final    BOTTLES DRAWN AEROBIC AND ANAEROBIC Blood Culture results may not be optimal due to an inadequate volume of blood received in culture bottles Performed at [facility], [address] [place], [place] [address]    Culture   Final    NO GROWTH < 12 HOURS Performed at [facility], [place] [address], [place], [place] [address]    Report Status PENDING  Incomplete  Blood culture (routine x 2)     Status: None (Preliminary result)   Collection Time: 10/06/20  7:24 PM   Specimen: BLOOD RIGHT WRIST  Result Value Ref Range Status   Specimen Description   Final    BLOOD RIGHT WRIST Performed at [facility], [address] [place], [place] [address]    Special Requests   Final    BOTTLES DRAWN AEROBIC AND ANAEROBIC Blood Culture adequate volume Performed at [facility], [address] [place], [place] [address]    Culture   Final    NO GROWTH < 12 HOURS Performed at [facility], [place] [address] [place], [place] [address]    Report Status PENDING  Incomplete  Respiratory (~20 pathogens) panel by PCR     Status: None   Collection Time:  10/06/20  7:51 PM   Specimen: Nasopharyngeal Swab; Respiratory  Result Value Ref Range Status   Adenovirus NOT DETECTED NOT DETECTED Final   Coronavirus 229E NOT DETECTED NOT DETECTED Final    Comment: (NOTE) The Coronavirus on the Respiratory Panel, DOES NOT test for the novel  Coronavirus (2019 nCoV)    Coronavirus HKU1 NOT DETECTED NOT DETECTED Final   Coronavirus NL63 NOT DETECTED NOT DETECTED Final   Coronavirus OC43 NOT DETECTED NOT DETECTED Final   Metapneumovirus NOT DETECTED NOT DETECTED Final   Rhinovirus / Enterovirus NOT DETECTED NOT DETECTED Final   Influenza A NOT DETECTED NOT DETECTED Final   Influenza B NOT DETECTED NOT DETECTED Final   Parainfluenza Virus 1 NOT DETECTED NOT DETECTED Final   Parainfluenza Virus 2 NOT DETECTED NOT DETECTED Final   Parainfluenza Virus 3 NOT DETECTED NOT DETECTED Final   Parainfluenza Virus 4 NOT DETECTED NOT DETECTED Final   Respiratory Syncytial Virus NOT DETECTED NOT DETECTED Final   Bordetella pertussis NOT DETECTED NOT DETECTED Final   Bordetella Parapertussis NOT DETECTED NOT DETECTED Final   Chlamydophila pneumoniae NOT DETECTED NOT DETECTED Final   Mycoplasma pneumoniae NOT DETECTED NOT DETECTED Final    Comment: Performed at [facility], [place]. [address] [place], [place] [address]  Resp Panel by RT-PCR (Flu A&B, Covid) Nasopharyngeal Swab     Status: None   Collection Time: 10/06/20  7:51 PM   Specimen: Nasopharyngeal Swab; Nasopharyngeal(NP) swabs in vial transport medium  Result Value Ref Range Status   SARS Coronavirus 2 by RT PCR NEGATIVE NEGATIVE Final    Comment: (NOTE) SARS-CoV-2 target nucleic acids are NOT DETECTED.  The SARS-CoV-2 RNA is generally detectable in upper respiratory specimens during the acute phase of infection. The lowest concentration of SARS-CoV-2 viral copies this assay can detect is 138 copies/mL. A negative result does not preclude SARS-Cov-2 infection and should not be used as the sole  basis for treatment or other  patient management decisions. A negative result may occur with  improper specimen collection/handling, submission of specimen other than nasopharyngeal swab, presence of viral mutation(s) within the areas targeted by this assay, and inadequate number of viral copies(<138 copies/mL). A negative result must be combined with clinical observations, patient history, and epidemiological information. The expected result is Negative.  Fact Sheet for Patients:  EntrepreneurPulse.com.au  Fact Sheet for Healthcare Providers:  IncredibleEmployment.be  This test is no t yet approved or cleared by the [place] FDA and  has been authorized for detection and/or diagnosis of SARS-CoV-2 by FDA under an Emergency Use Authorization (EUA). This EUA will remain  in effect (meaning this test can be used) for the duration of the COVID-19 declaration under Section 564(b)(1) of the Act, 21 U.S.C.section 360bbb-3(b)(1), unless the authorization is terminated  or revoked sooner.       Influenza A by PCR NEGATIVE NEGATIVE Final   Influenza B by PCR NEGATIVE NEGATIVE Final    Comment: (NOTE) The Xpert Xpress SARS-CoV-2/FLU/RSV plus assay is intended as an aid in the diagnosis of influenza from Nasopharyngeal swab specimens and should not be used as a sole basis for treatment. Nasal washings and aspirates are unacceptable for Xpert Xpress SARS-CoV-2/FLU/RSV testing.  Fact Sheet for Patients: EntrepreneurPulse.com.au  Fact Sheet for Healthcare Providers: IncredibleEmployment.be  This test is not yet approved or cleared by the [place] FDA and has been authorized for detection and/or diagnosis of SARS-CoV-2 by FDA under an Emergency Use Authorization (EUA). This EUA will remain in effect (meaning this test can be used) for the duration of the COVID-19 declaration under Section 564(b)(1) of the Act,  21 U.S.C. section 360bbb-3(b)(1), unless the authorization is terminated or revoked.  Performed at [facility], [address] [place], [place] [address]   MRSA Next Gen by PCR, Nasal     Status: None   Collection Time: 10/07/20 11:53 AM   Specimen: Nasal Mucosa; Nasal Swab  Result Value Ref Range Status   MRSA by PCR Next Gen NOT DETECTED NOT DETECTED Final    Comment: (NOTE) The GeneXpert MRSA Assay (FDA approved for NASAL specimens only), is one component of a comprehensive MRSA colonization surveillance program. It is not intended to diagnose MRSA infection nor to guide or monitor treatment for MRSA infections. Test performance is not FDA approved in patients less than 41 years old. Performed at [facility], [place] [address] [place], [place] [address]      Medications:    allopurinol  300 mg Oral Daily   amLODipine  10 mg Oral Daily   enoxaparin (LOVENOX) injection  40 mg Subcutaneous Daily   Continuous Infusions:  ceFEPime (MAXIPIME) IV 2 g (10/08/20 0424)   metronidazole 500 mg (10/08/20 0823)   vancomycin 1,000 mg (10/07/20 2303)      LOS: 2 days   Charlynne Cousins  [facility]  10/08/2020, 9:33 AM

## 2020-10-08 NOTE — Progress Notes (Signed)
   10/08/20 0809  Assess: MEWS Score  Temp (!) 102.4 F (39.1 C)  BP 136/78  Pulse Rate (!) 108  Resp (!) 28  Level of Consciousness Alert  SpO2 99 %  O2 Device Nasal Cannula  O2 Flow Rate (L/min) 2 L/min  Assess: MEWS Score  MEWS Temp 2  MEWS Systolic 0  MEWS Pulse 1  MEWS RR 2  MEWS LOC 0  MEWS Score 5  MEWS Score Color Red  Assess: if the MEWS score is Yellow or Red  Were vital signs taken at a resting state? Yes  Focused Assessment Change from prior assessment (see assessment flowsheet)  Early Detection of Sepsis Score *See Row Information* High  MEWS guidelines implemented *See Row Information* Yes  Treat  MEWS Interventions Administered prn meds/treatments  Pain Scale 0-10  Pain Score 0  Take Vital Signs  Increase Vital Sign Frequency  Red: Q 1hr X 4 then Q 4hr X 4, if remains red, continue Q 4hrs  Escalate  MEWS: Escalate Red: discuss with charge nurse/RN and provider, consider discussing with RRT  Notify: Charge Nurse/RN  Name of Charge Nurse/RN Notified Yoko RN  Date Charge Nurse/RN Notified 10/08/20  Time Charge Nurse/RN Notified 0840  Notify: Provider  Provider Name/Title Dr. Aileen Fass  Date Provider Notified 10/08/20  Time Provider Notified 0830  Notification Type Call  Notification Reason Change in status  Provider response No new orders  Date of Provider Response 10/08/20  Time of Provider Response 0830  Notify: Rapid Response  Name of Rapid Response RN Notified Saralyn Pilar RN  Date Rapid Response Notified 10/08/20  Time Rapid Response Notified 0842  Document  Patient Outcome Not stable and remains on department    Patient febrile, and having increased RR and work of breathing. MD notified, a little wheezing in the left lung. MD Venetia Constable said he will come to bedside in about 30 minutes and to continue to monitor at this time. Advil given for fever since Tylenol was recently given.

## 2020-10-09 ENCOUNTER — Inpatient Hospital Stay (HOSPITAL_COMMUNITY): Payer: BC Managed Care – PPO

## 2020-10-09 DIAGNOSIS — I10 Essential (primary) hypertension: Secondary | ICD-10-CM | POA: Diagnosis not present

## 2020-10-09 DIAGNOSIS — M545 Low back pain, unspecified: Secondary | ICD-10-CM

## 2020-10-09 DIAGNOSIS — E871 Hypo-osmolality and hyponatremia: Secondary | ICD-10-CM | POA: Diagnosis not present

## 2020-10-09 DIAGNOSIS — A419 Sepsis, unspecified organism: Principal | ICD-10-CM

## 2020-10-09 DIAGNOSIS — F40240 Claustrophobia: Secondary | ICD-10-CM

## 2020-10-09 DIAGNOSIS — Z6841 Body Mass Index (BMI) 40.0 and over, adult: Secondary | ICD-10-CM

## 2020-10-09 DIAGNOSIS — R509 Fever, unspecified: Secondary | ICD-10-CM

## 2020-10-09 DIAGNOSIS — G9340 Encephalopathy, unspecified: Secondary | ICD-10-CM

## 2020-10-09 DIAGNOSIS — Z9889 Other specified postprocedural states: Secondary | ICD-10-CM

## 2020-10-09 DIAGNOSIS — L988 Other specified disorders of the skin and subcutaneous tissue: Secondary | ICD-10-CM

## 2020-10-09 DIAGNOSIS — K529 Noninfective gastroenteritis and colitis, unspecified: Secondary | ICD-10-CM | POA: Diagnosis not present

## 2020-10-09 DIAGNOSIS — J9601 Acute respiratory failure with hypoxia: Secondary | ICD-10-CM | POA: Diagnosis not present

## 2020-10-09 DIAGNOSIS — Z87898 Personal history of other specified conditions: Secondary | ICD-10-CM

## 2020-10-09 DIAGNOSIS — K51013 Ulcerative (chronic) pancolitis with fistula: Secondary | ICD-10-CM

## 2020-10-09 DIAGNOSIS — J189 Pneumonia, unspecified organism: Secondary | ICD-10-CM | POA: Diagnosis not present

## 2020-10-09 LAB — HEPATITIS PANEL, ACUTE
HCV Ab: NONREACTIVE
Hep A IgM: NONREACTIVE
Hep B C IgM: NONREACTIVE
Hepatitis B Surface Ag: NONREACTIVE

## 2020-10-09 LAB — CBC WITH DIFFERENTIAL/PLATELET
Abs Immature Granulocytes: 0.1 10*3/uL — ABNORMAL HIGH (ref 0.00–0.07)
Basophils Absolute: 0 10*3/uL (ref 0.0–0.1)
Basophils Relative: 0 %
Eosinophils Absolute: 0 10*3/uL (ref 0.0–0.5)
Eosinophils Relative: 0 %
HCT: 35.6 % — ABNORMAL LOW (ref 39.0–52.0)
Hemoglobin: 12.2 g/dL — ABNORMAL LOW (ref 13.0–17.0)
Immature Granulocytes: 1 %
Lymphocytes Relative: 6 %
Lymphs Abs: 0.8 10*3/uL (ref 0.7–4.0)
MCH: 32.4 pg (ref 26.0–34.0)
MCHC: 34.3 g/dL (ref 30.0–36.0)
MCV: 94.7 fL (ref 80.0–100.0)
Monocytes Absolute: 0.8 10*3/uL (ref 0.1–1.0)
Monocytes Relative: 7 %
Neutro Abs: 10.7 10*3/uL — ABNORMAL HIGH (ref 1.7–7.7)
Neutrophils Relative %: 86 %
Platelets: 220 10*3/uL (ref 150–400)
RBC: 3.76 MIL/uL — ABNORMAL LOW (ref 4.22–5.81)
RDW: 12.5 % (ref 11.5–15.5)
WBC: 12.4 10*3/uL — ABNORMAL HIGH (ref 4.0–10.5)
nRBC: 0 % (ref 0.0–0.2)

## 2020-10-09 LAB — FERRITIN: Ferritin: 979 ng/mL — ABNORMAL HIGH (ref 24–336)

## 2020-10-09 LAB — BASIC METABOLIC PANEL
Anion gap: 9 (ref 5–15)
BUN: 19 mg/dL (ref 6–20)
CO2: 22 mmol/L (ref 22–32)
Calcium: 8.6 mg/dL — ABNORMAL LOW (ref 8.9–10.3)
Chloride: 92 mmol/L — ABNORMAL LOW (ref 98–111)
Creatinine, Ser: 1.14 mg/dL (ref 0.61–1.24)
GFR, Estimated: >60 mL/min (ref >60)
Glucose, Bld: 104 mg/dL — ABNORMAL HIGH (ref 70–99)
Potassium: 4 mmol/L (ref 3.5–5.1)
Sodium: 123 mmol/L — ABNORMAL LOW (ref 135–145)

## 2020-10-09 LAB — CBC
HCT: 34.8 % — ABNORMAL LOW (ref 39.0–52.0)
Hemoglobin: 12.5 g/dL — ABNORMAL LOW (ref 13.0–17.0)
MCH: 33.6 pg (ref 26.0–34.0)
MCHC: 35.9 g/dL (ref 30.0–36.0)
MCV: 93.5 fL (ref 80.0–100.0)
Platelets: 196 10*3/uL (ref 150–400)
RBC: 3.72 MIL/uL — ABNORMAL LOW (ref 4.22–5.81)
RDW: 12.4 % (ref 11.5–15.5)
WBC: 12.2 10*3/uL — ABNORMAL HIGH (ref 4.0–10.5)
nRBC: 0 % (ref 0.0–0.2)

## 2020-10-09 LAB — LEGIONELLA PNEUMOPHILA SEROGP 1 UR AG: L. pneumophila Serogp 1 Ur Ag: NEGATIVE

## 2020-10-09 LAB — SEDIMENTATION RATE: Sed Rate: 80 mm/hr — ABNORMAL HIGH (ref 0–16)

## 2020-10-09 LAB — C-REACTIVE PROTEIN: CRP: 32.2 mg/dL — ABNORMAL HIGH (ref <1.0)

## 2020-10-09 LAB — CK: Total CK: 55 U/L (ref 49–397)

## 2020-10-09 LAB — HIV ANTIBODY (ROUTINE TESTING W REFLEX): HIV Screen 4th Generation wRfx: NONREACTIVE

## 2020-10-09 LAB — LACTATE DEHYDROGENASE: LDH: 198 U/L — ABNORMAL HIGH (ref 98–192)

## 2020-10-09 MED ORDER — NYSTATIN 100000 UNIT/ML MT SUSP
5.0000 mL | Freq: Four times a day (QID) | OROMUCOSAL | Status: DC
  Administered 2020-10-09 – 2020-10-17 (×29): 500000 [IU] via ORAL
  Filled 2020-10-09 (×29): qty 5

## 2020-10-09 NOTE — Progress Notes (Signed)
[facility] PROGRESS NOTE    Progress Note  Austin Chang  NUU:[id number] DOB: 1965/04/17 DOA: 10/06/2020 PCP: Josetta Huddle, MD     Brief Narrative:   Austin Chang is an 55 y.o. male past medical history significant for ulcerative colitis on Humira BMI of 38 essential hypertension ankylosing spondylitis presents to the ED for evaluation of fever fatigue loss of appetite that began on 10/02/2020, in the ED he was found to be febrile satting 90 tachycardic and tachypneic, mild hyponatremia elevated procalcitonin CTA negative for PE, positive for pneumonia.  Having fevers and leukocytosis.   Assessment/Plan:   Sepsis due to community-acquired pneumonia: He is on immunosuppressive therapy. He continues to to spike fevers question of there is a lower delay reaction currently on IV Doxy and cefepime. Consult ID, get a CT scan of the abdomen and pelvis. He has been weaned to room air.  Hypovolemic hyponatremia: Likely due to i pneumonic process and and hypovolemia improved. Recheck a basic metabolic panel tomorrow morning.  Ulcerative colitis: Resume Humira as an outpatient.  Essential hypertension: Continue to hold antihypertensive medication.   DVT prophylaxis: lovenox Family Communication:none Status is: Inpatient  Remains inpatient appropriate because:Hemodynamically unstable  Dispo: The patient is from: Home              Anticipated d/c is to: Home              Patient currently is not medically stable to d/c.   Difficult to place patient No        Code Status:     Code Status Orders  (From admission, onward)           Start     Ordered   10/07/20 0008  Full code  Continuous        10/07/20 0010           Code Status History     Date Active Date Inactive Code Status Order ID Comments User Context   01/31/2020 2150 02/02/2020 1921 Full Code [id number]  Doran Heater, DO Inpatient         IV Access:   Peripheral IV   Procedures and  diagnostic studies:   No results found.   Medical Consultants:   None.   Subjective:     NIC LAMPE still feels weak spiking fevers.  Objective:    Vitals:   10/08/20 2158 10/09/20 0051 10/09/20 0509 10/09/20 0700  BP: 111/67 123/70 (!) 127/96 111/68  Pulse: (!) 119 (!) 106 (!) 120 (!) 108  Resp: (!) 29 20 17  (!) 21  Temp: 98.6 F (37 C) 98.1 F (36.7 C) (!) 100.7 F (38.2 C) (!) 101.7 F (38.7 C)  TempSrc: Oral Oral Oral Axillary  SpO2: 94% 92% 95% 95%  Weight:   117.4 kg   Height:       SpO2: 95 % O2 Flow Rate (L/min): 2 L/min   Intake/Output Summary (Last 24 hours) at 10/09/2020 0924 Last data filed at 10/09/2020 0308 Gross per 24 hour  Intake 641.81 ml  Output --  Net 641.81 ml    Filed Weights   10/06/20 2143 10/08/20 0400 10/09/20 0509  Weight: 112.7 kg 117.1 kg 117.4 kg    Exam: General exam: In no acute distress. Respiratory system: Good air movement and clear to auscultation. Cardiovascular system: S1 & S2 heard, RRR. No JVD. Gastrointestinal system: Abdomen is nondistended, soft and nontender.  Extremities: No pedal edema. Skin: No rashes, lesions or ulcers  Data Reviewed:    Labs: Basic Metabolic Panel: Recent Labs  Lab 10/06/20 1408 10/07/20 0252 10/08/20 0245 10/09/20 0104  NA 123* 125* 127* 123*  K 4.0 3.6 4.1 4.0  CL 88* 89* 92* 92*  CO2 27 25 26 22   GLUCOSE 153* 135* 129* 104*  BUN 10 11 14 19   CREATININE 1.08 1.07 1.13 1.14  CALCIUM 8.6* 8.6* 9.1 8.6*  MG  --  1.7  --   --   PHOS  --  3.4  --   --     GFR Estimated Creatinine Clearance: 89 mL/min (by C-G formula based on SCr of 1.14 mg/dL). Liver Function Tests: Recent Labs  Lab 10/06/20 1408  AST 39  ALT 30  ALKPHOS 111  BILITOT 1.1  PROT 7.3  ALBUMIN 3.3*    No results for input(s): LIPASE, AMYLASE in the last 168 hours. No results for input(s): AMMONIA in the last 168 hours. Coagulation profile No results for input(s): INR, PROTIME in the last  168 hours. COVID-19 Labs  Recent Labs    10/06/20 1459  DDIMER 1.85*     Lab Results  Component Value Date   SARSCOV2NAA NEGATIVE 10/06/2020   SARSCOV2NAA NEGATIVE 10/06/2020   SARSCOV2NAA POSITIVE (A) 01/30/2020    CBC: Recent Labs  Lab 10/06/20 1408 10/07/20 0252 10/08/20 0245 10/08/20 0313 10/09/20 0104  WBC 8.3 9.6 THIS TEST WAS ORDERED IN ERROR AND HAS BEEN CREDITED. 11.8* 12.2*  NEUTROABS 6.7  --   --  9.2*  --   HGB 14.1 13.0 THIS TEST WAS ORDERED IN ERROR AND HAS BEEN CREDITED. 13.6 12.5*  HCT 38.1* 36.2* THIS TEST WAS ORDERED IN ERROR AND HAS BEEN CREDITED. 38.5* 34.8*  MCV 90.9 92.6 THIS TEST WAS ORDERED IN ERROR AND HAS BEEN CREDITED. 94.1 93.5  PLT 153 160 THIS TEST WAS ORDERED IN ERROR AND HAS BEEN CREDITED. 174 196    Cardiac Enzymes: No results for input(s): CKTOTAL, CKMB, CKMBINDEX, TROPONINI in the last 168 hours. BNP (last 3 results) No results for input(s): PROBNP in the last 8760 hours. CBG: No results for input(s): GLUCAP in the last 168 hours. D-Dimer: Recent Labs    10/06/20 1459  DDIMER 1.85*    Hgb A1c: No results for input(s): HGBA1C in the last 72 hours. Lipid Profile: No results for input(s): CHOL, HDL, LDLCALC, TRIG, CHOLHDL, LDLDIRECT in the last 72 hours. Thyroid function studies: No results for input(s): TSH, T4TOTAL, T3FREE, THYROIDAB in the last 72 hours.  Invalid input(s): FREET3 Anemia work up: No results for input(s): VITAMINB12, FOLATE, FERRITIN, TIBC, IRON, RETICCTPCT in the last 72 hours. Sepsis Labs: Recent Labs  Lab 10/06/20 1820 10/06/20 1922 10/07/20 0252 10/08/20 0245 10/08/20 0313 10/09/20 0104  PROCALCITON 1.96  --   --  10.81  --   --   WBC  --   --  9.6 THIS TEST WAS ORDERED IN ERROR AND HAS BEEN CREDITED. 11.8* 12.2*  LATICACIDVEN  --  1.4  --   --   --   --     Microbiology Recent Results (from the past 240 hour(s))  Resp Panel by RT-PCR (Flu A&B, Covid) Nasopharyngeal Swab     Status: None    Collection Time: 10/06/20  2:08 PM   Specimen: Nasopharyngeal Swab; Nasopharyngeal(NP) swabs in vial transport medium  Result Value Ref Range Status   SARS Coronavirus 2 by RT PCR NEGATIVE NEGATIVE Final    Comment: (NOTE) SARS-CoV-2 target nucleic acids are NOT DETECTED.  The SARS-CoV-2 RNA is generally detectable in upper respiratory specimens during the acute phase of infection. The lowest concentration of SARS-CoV-2 viral copies this assay can detect is 138 copies/mL. A negative result does not preclude SARS-Cov-2 infection and should not be used as the sole basis for treatment or other patient management decisions. A negative result may occur with  improper specimen collection/handling, submission of specimen other than nasopharyngeal swab, presence of viral mutation(s) within the areas targeted by this assay, and inadequate number of viral copies(<138 copies/mL). A negative result must be combined with clinical observations, patient history, and epidemiological information. The expected result is Negative.  Fact Sheet for Patients:  EntrepreneurPulse.com.au  Fact Sheet for Healthcare Providers:  IncredibleEmployment.be  This test is no t yet approved or cleared by the [place] FDA and  has been authorized for detection and/or diagnosis of SARS-CoV-2 by FDA under an Emergency Use Authorization (EUA). This EUA will remain  in effect (meaning this test can be used) for the duration of the COVID-19 declaration under Section 564(b)(1) of the Act, 21 U.S.C.section 360bbb-3(b)(1), unless the authorization is terminated  or revoked sooner.       Influenza A by PCR NEGATIVE NEGATIVE Final   Influenza B by PCR NEGATIVE NEGATIVE Final    Comment: (NOTE) The Xpert Xpress SARS-CoV-2/FLU/RSV plus assay is intended as an aid in the diagnosis of influenza from Nasopharyngeal swab specimens and should not be used as a sole basis for treatment.  Nasal washings and aspirates are unacceptable for Xpert Xpress SARS-CoV-2/FLU/RSV testing.  Fact Sheet for Patients: EntrepreneurPulse.com.au  Fact Sheet for Healthcare Providers: IncredibleEmployment.be  This test is not yet approved or cleared by the [place] FDA and has been authorized for detection and/or diagnosis of SARS-CoV-2 by FDA under an Emergency Use Authorization (EUA). This EUA will remain in effect (meaning this test can be used) for the duration of the COVID-19 declaration under Section 564(b)(1) of the Act, 21 U.S.C. section 360bbb-3(b)(1), unless the authorization is terminated or revoked.  Performed at [facility], [address] [place], [place] [address]   Blood culture (routine x 2)     Status: None (Preliminary result)   Collection Time: 10/06/20  7:10 PM   Specimen: Right Antecubital; Blood  Result Value Ref Range Status   Specimen Description   Final    RIGHT ANTECUBITAL Performed at [facility], [address] [place], [place] [address]    Special Requests   Final    BOTTLES DRAWN AEROBIC AND ANAEROBIC Blood Culture results may not be optimal due to an inadequate volume of blood received in culture bottles Performed at [facility], [address] [place], [place] [address]    Culture   Final    NO GROWTH 2 DAYS Performed at [facility], [place] [address], [place], [place] [address]    Report Status PENDING  Incomplete  Blood culture (routine x 2)     Status: None (Preliminary result)   Collection Time: 10/06/20  7:24 PM   Specimen: BLOOD RIGHT WRIST  Result Value Ref Range Status   Specimen Description   Final    BLOOD RIGHT WRIST Performed at [facility], [address] [place], [place] [address]    Special Requests   Final    BOTTLES DRAWN AEROBIC AND ANAEROBIC Blood Culture adequate volume Performed at [facility], [address] [place], [place]  [address]    Culture   Final    NO GROWTH 2 DAYS Performed at [facility], [place] [address] [place], [place] [address]    Report Status PENDING  Incomplete  Respiratory (~20 pathogens) panel by PCR     Status: None   Collection Time: 10/06/20  7:51 PM   Specimen: Nasopharyngeal Swab; Respiratory  Result Value Ref Range Status   Adenovirus NOT DETECTED NOT DETECTED Final   Coronavirus 229E NOT DETECTED NOT DETECTED Final    Comment: (NOTE) The Coronavirus on the Respiratory Panel, DOES NOT test for the novel  Coronavirus (2019 nCoV)    Coronavirus HKU1 NOT DETECTED NOT DETECTED Final   Coronavirus NL63 NOT DETECTED NOT DETECTED Final   Coronavirus OC43 NOT DETECTED NOT DETECTED Final   Metapneumovirus NOT DETECTED NOT DETECTED Final   Rhinovirus / Enterovirus NOT DETECTED NOT DETECTED Final   Influenza A NOT DETECTED NOT DETECTED Final   Influenza B NOT DETECTED NOT DETECTED Final   Parainfluenza Virus 1 NOT DETECTED NOT DETECTED Final   Parainfluenza Virus 2 NOT DETECTED NOT DETECTED Final   Parainfluenza Virus 3 NOT DETECTED NOT DETECTED Final   Parainfluenza Virus 4 NOT DETECTED NOT DETECTED Final   Respiratory Syncytial Virus NOT DETECTED NOT DETECTED Final   Bordetella pertussis NOT DETECTED NOT DETECTED Final   Bordetella Parapertussis NOT DETECTED NOT DETECTED Final   Chlamydophila pneumoniae NOT DETECTED NOT DETECTED Final   Mycoplasma pneumoniae NOT DETECTED NOT DETECTED Final    Comment: Performed at [facility], [place], [place], [place] [address]  Resp Panel by RT-PCR (Flu A&B, Covid) Nasopharyngeal Swab     Status: None   Collection Time: 10/06/20  7:51 PM   Specimen: Nasopharyngeal Swab; Nasopharyngeal(NP) swabs in vial transport medium  Result Value Ref Range Status   SARS Coronavirus 2 by RT PCR NEGATIVE NEGATIVE Final    Comment: (NOTE) SARS-CoV-2 target nucleic acids are NOT DETECTED.  The SARS-CoV-2 RNA is generally  detectable in upper respiratory specimens during the acute phase of infection. The lowest concentration of SARS-CoV-2 viral copies this assay can detect is 138 copies/mL. A negative result does not preclude SARS-Cov-2 infection and should not be used as the sole basis for treatment or other patient management decisions. A negative result may occur with  improper specimen collection/handling, submission of specimen other than nasopharyngeal swab, presence of viral mutation(s) within the areas targeted by this assay, and inadequate number of viral copies(<138 copies/mL). A negative result must be combined with clinical observations, patient history, and epidemiological information. The expected result is Negative.  Fact Sheet for Patients:  EntrepreneurPulse.com.au  Fact Sheet for Healthcare Providers:  IncredibleEmployment.be  This test is no t yet approved or cleared by the [place] FDA and  has been authorized for detection and/or diagnosis of SARS-CoV-2 by FDA under an Emergency Use Authorization (EUA). This EUA will remain  in effect (meaning this test can be used) for the duration of the COVID-19 declaration under Section 564(b)(1) of the Act, 21 U.S.C.section 360bbb-3(b)(1), unless the authorization is terminated  or revoked sooner.       Influenza A by PCR NEGATIVE NEGATIVE Final   Influenza B by PCR NEGATIVE NEGATIVE Final    Comment: (NOTE) The Xpert Xpress SARS-CoV-2/FLU/RSV plus assay is intended as an aid in the diagnosis of influenza from Nasopharyngeal swab specimens and should not be used as a sole basis for treatment. Nasal washings and aspirates are unacceptable for  Xpert Xpress SARS-CoV-2/FLU/RSV testing.  Fact Sheet for Patients: EntrepreneurPulse.com.au  Fact Sheet for Healthcare Providers: IncredibleEmployment.be  This test is not yet approved or cleared by the [place] FDA  and has been authorized for detection and/or diagnosis of SARS-CoV-2 by FDA under an Emergency Use Authorization (EUA). This EUA will remain in effect (meaning this test can be used) for the duration of the COVID-19 declaration under Section 564(b)(1) of the Act, 21 U.S.C. section 360bbb-3(b)(1), unless the authorization is terminated or revoked.  Performed at [facility], [address] [place], [place] [address]   MRSA Next Gen by PCR, Nasal     Status: None   Collection Time: 10/07/20 11:53 AM   Specimen: Nasal Mucosa; Nasal Swab  Result Value Ref Range Status   MRSA by PCR Next Gen NOT DETECTED NOT DETECTED Final    Comment: (NOTE) The GeneXpert MRSA Assay (FDA approved for NASAL specimens only), is one component of a comprehensive MRSA colonization surveillance program. It is not intended to diagnose MRSA infection nor to guide or monitor treatment for MRSA infections. Test performance is not FDA approved in patients less than 99 years old. Performed at [facility], [place] [address], [place], [place] [address]      Medications:    allopurinol  300 mg Oral Daily   amLODipine  10 mg Oral Daily   doxycycline  100 mg Oral Q12H   enoxaparin (LOVENOX) injection  40 mg Subcutaneous Daily   Continuous Infusions:  sodium chloride 10 mL/hr at 10/09/20 0636   ceFEPime (MAXIPIME) IV 2 g (10/09/20 7902)      LOS: 3 days   Charlynne Cousins  [facility]  10/09/2020, 9:24 AM

## 2020-10-09 NOTE — Progress Notes (Signed)
Oral temp is 98.8 RN aware of difference.

## 2020-10-09 NOTE — Progress Notes (Signed)
Bilateral lower extremity venous duplex has been completed. Preliminary results can be found in CV Proc through chart review.   10/09/20 10:56 AM Carlos Levering RVT

## 2020-10-09 NOTE — Consult Note (Addendum)
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 drainage of pelvic abscess and appendectomy in 2012 New low back pain Thrush  History of C-spine surgery History of ankylosing spondylitis Hypertension Obesity  Plan:  Obtain CT abdomen pelvis with contrast 2D echocardiogram Consider MRI of lumbar spine (though he will need anxiolytics because he is quite claustrophobic) I will send of battery of other FUO labs including repeat HIV testing hepatitis panel LDH ferritin ANA rheumatoid factor Epstein-Barr and CMV serologies CPK I will leave cefepime but DC doxycycline keeping the former to cover for possible undiagnosed intra-abdominal infection. Start nystatin swish and swallow for his thrush  Continue to look for no diagnostic clues to follow-up with imaging and investigation.  New Infectious Disease team to take over the service tomorrow.  Principal Problem:   Sepsis ([place]) Active Problems:   Essential hypertension   Inflammatory bowel disease   Hyponatremia   Bilateral pneumonia   Scheduled Meds:  allopurinol  300 mg Oral Daily   amLODipine  10 mg Oral Daily   enoxaparin (LOVENOX) injection  40 mg Subcutaneous Daily   Continuous Infusions:  sodium chloride 10 mL/hr at 10/09/20 0636   ceFEPime (MAXIPIME) IV 2 g (10/09/20 0637)   PRN Meds:.sodium chloride, acetaminophen, alum & mag hydroxide-simeth, ondansetron **OR** ondansetron (ZOFRAN) IV, oxyCODONE, zolpidem  HPI: ARVID MARENGO is a 55 y.o. male history of ulcerative colitis on Humira, with prior history in 2012 of sigmoid colon stricture with pelvic abscess and potential fistula to the wall of the  bladder status post sigmoid colectomy and colostomy drainage of pelvic abscess appendectomy and mobilization of splenic flexure fracture and March 15, 2010.  Then underwent closure of his colostomy in August 14, 2010.  Presented the emergency department on the eighth 2022 with symptoms of fever fatigue and loss of appetite.  He cannot really recall much about what happened that day because he seems to have also been quite confused upon arrival.  The admission H&P indicates that he had some nasal congestion and a negative COVID test but not much else in the way of symptoms other than some loose stools.  In the ER he was febrile 102.8.  And slightly hypoxemic.  He had hyponatremia with an elevated D-dimer.  CT angiogram did not show evidence of pulmonary embolism.  I have reviewed his CT angiogram and I cannot find any evidence of pneumonia or infiltrate but he has been read it is my understanding with the diagnosis of pneumonia having initially received ceftriaxone and azithromycin later followed by vancomycin and cefepime metronidazole and then cefepime and doxycycline.  He has continued to have fevers despite these antibiotics temperature of 102.8 yesterday .  His admission blood cultures remain negative.  On exam he has a quite distended abdomen which he says is unusual for him.  I really do not think he has much to suggest pneumonia clearly his lack of response to antibacterial therapy is worrisome for an undiagnosed intra-abdominal abscess or other cause for fever of unknown origin.  He is getting a CT of the abdomen pelvis today which may potentially reveal diagnosis.  He also has low back pain  which is new and if his CT of the abdomen pelvis is unrevealing I would like to pursue an MRI with contrast though he will need some anxiolytics because he has quite substantial claustrophobia with MRIs.  Also send off a battery of fever of unknown origin test.  I spent 82 minutes with the patient  including than 50% of the time in face to face counseling of the patient guarding the work-up for fever unknown origin personally reviewing CT angiogram which I felt shows no evidence of pneumonia or nodules along with his blood cultures his CBC is CMP along with review of medical records in preparation for the visit and during the visit and in coordination of her care.    Review of Systems: Review of Systems  Constitutional:  Positive for chills, fever and malaise/fatigue. Negative for weight loss.  HENT:  Negative for congestion and sore throat.   Eyes:  Negative for blurred vision and photophobia.  Respiratory:  Negative for cough, shortness of breath and wheezing.   Cardiovascular:  Negative for chest pain, palpitations and leg swelling.  Gastrointestinal:  Positive for nausea. Negative for abdominal pain, blood in stool, constipation, diarrhea, heartburn, melena and vomiting.  Genitourinary:  Negative for dysuria, flank pain and hematuria.  Musculoskeletal:  Positive for back pain. Negative for falls, joint pain and myalgias.  Skin:  Negative for itching and rash.  Neurological:  Positive for weakness. Negative for dizziness, focal weakness, loss of consciousness and headaches.  Endo/Heme/Allergies:  Does not bruise/bleed easily.  Psychiatric/Behavioral:  Positive for memory loss. Negative for depression and suicidal ideas. The patient does not have insomnia.    Past Medical History:  Diagnosis Date   Ankylosing spondylitis ([place])    Arthritis    Colitis    Colitis    Diverticulosis    Hypertension    Obesity    Rectal bleeding     Social History   Tobacco Use   Smoking status: Never   Smokeless tobacco: Never  Vaping Use   Vaping Use: Never used  Substance Use Topics   Alcohol use: Yes    Alcohol/week: 5.0 standard drinks    Types: 5 Standard drinks or equivalent per week    Comment: weekly   Drug use: No    Family History  Problem Relation Age of Onset   Heart  disease Mother    Heart disease Father    Anesthesia problems Neg Hx    Hypotension Neg Hx    Malignant hyperthermia Neg Hx    Pseudochol deficiency Neg Hx    Colon cancer Neg Hx    Rectal cancer Neg Hx    Esophageal cancer Neg Hx    Liver cancer Neg Hx    Allergies  Allergen Reactions   Septra [Bactrim] Other (See Comments)    Very bad migraine   Sulfamethoxazole-Trimethoprim     Other reaction(s): massive headache    OBJECTIVE: Blood pressure 130/77, pulse (!) 105, temperature 99.2 F (37.3 C), temperature source Oral, resp. rate (!) 21, height 5' 8"  (1.727 m), weight 117.4 kg, SpO2 95 %.  Physical Exam Constitutional:      Appearance: He is well-developed. He is obese.  HENT:     Head: Normocephalic and atraumatic.     Comments: Thrush Eyes:     Conjunctiva/sclera: Conjunctivae normal.  Cardiovascular:     Rate and Rhythm: Normal rate and regular rhythm.     Heart sounds: No murmur heard.   No friction rub. No gallop.  Pulmonary:     Effort: Respiratory distress present.     Breath sounds: No stridor. No wheezing or rhonchi.  Abdominal:     General: There is distension.     Palpations: Abdomen is soft. There is no mass.     Tenderness: There is abdominal tenderness. There is rebound. There is no guarding.     Hernia: No hernia is present.  Genitourinary:    Penis: Normal and circumcised.      Comments: He has some scrotal edema no lesions or pathology Musculoskeletal:        General: No tenderness. Normal range of motion.     Cervical back: Normal range of motion and neck supple.  Skin:    General: Skin is warm and dry.     Coloration: Skin is not pale.     Findings: No erythema or rash.  Neurological:     General: No focal deficit present.     Mental Status: He is alert and oriented to person, place, and time.  Psychiatric:        Attention and Perception: Attention normal.        Mood and Affect: Mood is anxious.        Behavior: Behavior normal.         Thought Content: Thought content normal.        Judgment: Judgment normal.   No splinter hemorrhages Janeway lesions or Osler's nodes. Lab Results Lab Results  Component Value Date   WBC 12.4 (H) 10/09/2020   HGB 12.2 (L) 10/09/2020   HCT 35.6 (L) 10/09/2020   MCV 94.7 10/09/2020   PLT 220 10/09/2020    Lab Results  Component Value Date   CREATININE 1.14 10/09/2020   BUN 19 10/09/2020   NA 123 (L) 10/09/2020   K 4.0 10/09/2020   CL 92 (L) 10/09/2020   CO2 22 10/09/2020    Lab Results  Component Value Date   ALT 30 10/06/2020   AST 39 10/06/2020   ALKPHOS 111 10/06/2020   BILITOT 1.1 10/06/2020     Microbiology: Recent Results (from the past 240 hour(s))  Resp Panel by RT-PCR (Flu A&B, Covid) Nasopharyngeal Swab     Status: None   Collection Time: 10/06/20  2:08 PM   Specimen: Nasopharyngeal Swab; Nasopharyngeal(NP) swabs in vial transport medium  Result Value Ref Range Status   SARS Coronavirus 2 by RT PCR NEGATIVE NEGATIVE Final    Comment: (NOTE) SARS-CoV-2 target nucleic acids are NOT DETECTED.  The SARS-CoV-2 RNA is generally detectable in upper respiratory specimens during the acute phase of infection. The lowest concentration of SARS-CoV-2 viral copies this assay can detect is 138 copies/mL. A negative result does not preclude SARS-Cov-2 infection and should not be used as the sole basis for treatment or other patient management decisions. A negative result may occur with  improper specimen collection/handling, submission of specimen other than nasopharyngeal swab, presence of viral mutation(s) within the areas targeted by this assay, and inadequate number of viral copies(<138 copies/mL). A negative result must be combined with clinical observations, patient history, and epidemiological information. The expected result is Negative.  Fact Sheet for Patients:  EntrepreneurPulse.com.au  Fact Sheet for Healthcare Providers:   IncredibleEmployment.be  This test is no t yet approved or cleared by the [place] FDA and  has been authorized for detection and/or diagnosis of SARS-CoV-2 by FDA under an Emergency Use Authorization (EUA). This EUA will remain  in effect (meaning this test can be used)  for the duration of the COVID-19 declaration under Section 564(b)(1) of the Act, 21 U.S.C.section 360bbb-3(b)(1), unless the authorization is terminated  or revoked sooner.       Influenza A by PCR NEGATIVE NEGATIVE Final   Influenza B by PCR NEGATIVE NEGATIVE Final    Comment: (NOTE) The Xpert Xpress SARS-CoV-2/FLU/RSV plus assay is intended as an aid in the diagnosis of influenza from Nasopharyngeal swab specimens and should not be used as a sole basis for treatment. Nasal washings and aspirates are unacceptable for Xpert Xpress SARS-CoV-2/FLU/RSV testing.  Fact Sheet for Patients: EntrepreneurPulse.com.au  Fact Sheet for Healthcare Providers: IncredibleEmployment.be  This test is not yet approved or cleared by the [place] FDA and has been authorized for detection and/or diagnosis of SARS-CoV-2 by FDA under an Emergency Use Authorization (EUA). This EUA will remain in effect (meaning this test can be used) for the duration of the COVID-19 declaration under Section 564(b)(1) of the Act, 21 U.S.C. section 360bbb-3(b)(1), unless the authorization is terminated or revoked.  Performed at [facility], [address] [place], [place] [address]   Blood culture (routine x 2)     Status: None (Preliminary result)   Collection Time: 10/06/20  7:10 PM   Specimen: Right Antecubital; Blood  Result Value Ref Range Status   Specimen Description   Final    RIGHT ANTECUBITAL Performed at [facility], [address] [place], [place] [address]    Special Requests   Final    BOTTLES DRAWN AEROBIC AND ANAEROBIC Blood Culture results  may not be optimal due to an inadequate volume of blood received in culture bottles Performed at [facility], [address] [place], [place] [address]    Culture   Final    NO GROWTH 2 DAYS Performed at [facility], [place] [address] [place], [place] [address]    Report Status PENDING  Incomplete  Blood culture (routine x 2)     Status: None (Preliminary result)   Collection Time: 10/06/20  7:24 PM   Specimen: BLOOD RIGHT WRIST  Result Value Ref Range Status   Specimen Description   Final    BLOOD RIGHT WRIST Performed at [facility], [address] [place], [place] [address]    Special Requests   Final    BOTTLES DRAWN AEROBIC AND ANAEROBIC Blood Culture adequate volume Performed at [facility], [address] [place], [place] [address]    Culture   Final    NO GROWTH 2 DAYS Performed at [facility], [place] [address] [place], [place] [address]    Report Status PENDING  Incomplete  Respiratory (~20 pathogens) panel by PCR     Status: None   Collection Time: 10/06/20  7:51 PM   Specimen: Nasopharyngeal Swab; Respiratory  Result Value Ref Range Status   Adenovirus NOT DETECTED NOT DETECTED Final   Coronavirus 229E NOT DETECTED NOT DETECTED Final    Comment: (NOTE) The Coronavirus on the Respiratory Panel, DOES NOT test for the novel  Coronavirus (2019 nCoV)    Coronavirus HKU1 NOT DETECTED NOT DETECTED Final   Coronavirus NL63 NOT DETECTED NOT DETECTED Final   Coronavirus OC43 NOT DETECTED NOT DETECTED Final   Metapneumovirus NOT DETECTED NOT DETECTED Final   Rhinovirus / Enterovirus NOT DETECTED NOT DETECTED Final   Influenza A NOT DETECTED NOT DETECTED Final   Influenza B NOT DETECTED NOT DETECTED Final  Parainfluenza Virus 1 NOT DETECTED NOT DETECTED Final   Parainfluenza Virus 2 NOT DETECTED NOT DETECTED Final   Parainfluenza Virus 3 NOT DETECTED NOT DETECTED Final   Parainfluenza Virus 4 NOT DETECTED NOT DETECTED  Final   Respiratory Syncytial Virus NOT DETECTED NOT DETECTED Final   Bordetella pertussis NOT DETECTED NOT DETECTED Final   Bordetella Parapertussis NOT DETECTED NOT DETECTED Final   Chlamydophila pneumoniae NOT DETECTED NOT DETECTED Final   Mycoplasma pneumoniae NOT DETECTED NOT DETECTED Final    Comment: Performed at [facility], [place] [address] [place], [place] [address]  Resp Panel by RT-PCR (Flu A&B, Covid) Nasopharyngeal Swab     Status: None   Collection Time: 10/06/20  7:51 PM   Specimen: Nasopharyngeal Swab; Nasopharyngeal(NP) swabs in vial transport medium  Result Value Ref Range Status   SARS Coronavirus 2 by RT PCR NEGATIVE NEGATIVE Final    Comment: (NOTE) SARS-CoV-2 target nucleic acids are NOT DETECTED.  The SARS-CoV-2 RNA is generally detectable in upper respiratory specimens during the acute phase of infection. The lowest concentration of SARS-CoV-2 viral copies this assay can detect is 138 copies/mL. A negative result does not preclude SARS-Cov-2 infection and should not be used as the sole basis for treatment or other patient management decisions. A negative result may occur with  improper specimen collection/handling, submission of specimen other than nasopharyngeal swab, presence of viral mutation(s) within the areas targeted by this assay, and inadequate number of viral copies(<138 copies/mL). A negative result must be combined with clinical observations, patient history, and epidemiological information. The expected result is Negative.  Fact Sheet for Patients:  EntrepreneurPulse.com.au  Fact Sheet for Healthcare Providers:  IncredibleEmployment.be  This test is no t yet approved or cleared by the [place] FDA and  has been authorized for detection and/or diagnosis of SARS-CoV-2 by FDA under an Emergency Use Authorization (EUA). This EUA will remain  in effect (meaning this test can be used) for the duration  of the COVID-19 declaration under Section 564(b)(1) of the Act, 21 U.S.C.section 360bbb-3(b)(1), unless the authorization is terminated  or revoked sooner.       Influenza A by PCR NEGATIVE NEGATIVE Final   Influenza B by PCR NEGATIVE NEGATIVE Final    Comment: (NOTE) The Xpert Xpress SARS-CoV-2/FLU/RSV plus assay is intended as an aid in the diagnosis of influenza from Nasopharyngeal swab specimens and should not be used as a sole basis for treatment. Nasal washings and aspirates are unacceptable for Xpert Xpress SARS-CoV-2/FLU/RSV testing.  Fact Sheet for Patients: EntrepreneurPulse.com.au  Fact Sheet for Healthcare Providers: IncredibleEmployment.be  This test is not yet approved or cleared by the [place] FDA and has been authorized for detection and/or diagnosis of SARS-CoV-2 by FDA under an Emergency Use Authorization (EUA). This EUA will remain in effect (meaning this test can be used) for the duration of the COVID-19 declaration under Section 564(b)(1) of the Act, 21 U.S.C. section 360bbb-3(b)(1), unless the authorization is terminated or revoked.  Performed at [facility], [address] [place], [place] [address]   MRSA Next Gen by PCR, Nasal     Status: None   Collection Time: 10/07/20 11:53 AM   Specimen: Nasal Mucosa; Nasal Swab  Result Value Ref Range Status   MRSA by PCR Next Gen NOT DETECTED NOT DETECTED Final    Comment: (NOTE) The GeneXpert MRSA Assay (FDA approved for NASAL specimens only), is one component of a comprehensive MRSA colonization surveillance program. It  is not intended to diagnose MRSA infection nor to guide or monitor treatment for MRSA infections. Test performance is not FDA approved in patients less than 9 years old. Performed at [facility], [place] [address] [place], [place] [address]     Alcide Evener, [facility] [phone] pager  10/09/2020, 1:45 PM

## 2020-10-09 NOTE — Progress Notes (Signed)
MEWS is driven by HR and temp. Pt had been previously red.  In addition to proper ventilation and cooling of Pt' s room Tylenol was administered to control temp

## 2020-10-10 ENCOUNTER — Inpatient Hospital Stay (HOSPITAL_COMMUNITY): Payer: BC Managed Care – PPO

## 2020-10-10 DIAGNOSIS — R509 Fever, unspecified: Secondary | ICD-10-CM | POA: Diagnosis not present

## 2020-10-10 DIAGNOSIS — K529 Noninfective gastroenteritis and colitis, unspecified: Secondary | ICD-10-CM

## 2020-10-10 DIAGNOSIS — B370 Candidal stomatitis: Secondary | ICD-10-CM | POA: Diagnosis not present

## 2020-10-10 LAB — ECHOCARDIOGRAM COMPLETE
AR max vel: 2.17 cm2
AV Area VTI: 1.78 cm2
AV Area mean vel: 1.98 cm2
AV Mean grad: 11 mmHg
AV Peak grad: 18.7 mmHg
Ao pk vel: 2.16 m/s
Area-P 1/2: 4.8 cm2
Height: 68 in
S' Lateral: 2.9 cm
Weight: 4168 oz

## 2020-10-10 LAB — RPR: RPR Ser Ql: NONREACTIVE

## 2020-10-10 MED ORDER — IOHEXOL 9 MG/ML PO SOLN
ORAL | Status: AC
  Administered 2020-10-10: 500 mL
  Filled 2020-10-10: qty 1000

## 2020-10-10 MED ORDER — IOHEXOL 350 MG/ML SOLN
100.0000 mL | Freq: Once | INTRAVENOUS | Status: AC | PRN
  Administered 2020-10-10: 100 mL via INTRAVENOUS

## 2020-10-10 MED ORDER — PERFLUTREN LIPID MICROSPHERE
1.0000 mL | INTRAVENOUS | Status: AC | PRN
  Administered 2020-10-10: 2 mL via INTRAVENOUS
  Filled 2020-10-10: qty 10

## 2020-10-10 MED ORDER — METRONIDAZOLE 500 MG PO TABS
500.0000 mg | ORAL_TABLET | Freq: Two times a day (BID) | ORAL | Status: DC
  Administered 2020-10-10 (×2): 500 mg via ORAL
  Filled 2020-10-10 (×2): qty 1

## 2020-10-10 MED ORDER — SODIUM CHLORIDE 0.9 % IV SOLN
INTRAVENOUS | Status: DC
  Administered 2020-10-10: 1000 mL via INTRAVENOUS

## 2020-10-10 NOTE — Progress Notes (Signed)
[facility] PROGRESS NOTE    Progress Note  [name]  JQG:[id number] DOB: 09-02-1965 DOA: 10/06/2020 PCP: Josetta Huddle, MD     Brief Narrative:   Austin Chang is an 55 y.o. male past medical history significant for ulcerative colitis on Humira BMI of 38 essential hypertension ankylosing spondylitis presents to the ED for evaluation of fever fatigue loss of appetite that began on 10/02/2020, in the ED he was found to be febrile satting 90 tachycardic and tachypneic, mild hyponatremia elevated procalcitonin CTA negative for PE, positive for pneumonia.  Having fevers and leukocytosis.   Assessment/Plan:   Fever of unknown source: She is currently on IV cefepime and Flagyl he has defervesced. Infectious disease was consulted recommended a CT scan of the abdomen and pelvis 2D echo are pending. Continue cefepime and Flagyl. Nystatin swish and swallow  Hypovolemic hyponatremia: Likely due to i pneumonic process and and hypovolemia improved. Recheck a basic metabolic panel tomorrow morning.  Ulcerative colitis: Resume Humira as an outpatient.  Essential hypertension: Continue to hold antihypertensive medication.   DVT prophylaxis: lovenox Family Communication:none Status is: Inpatient  Remains inpatient appropriate because:Hemodynamically unstable  Dispo: The patient is from: Home              Anticipated d/c is to: Home              Patient currently is not medically stable to d/c.   Difficult to place patient No        Code Status:     Code Status Orders  (From admission, onward)           Start     Ordered   10/07/20 0008  Full code  Continuous        10/07/20 0010           Code Status History     Date Active Date Inactive Code Status Order ID Comments User Context   01/31/2020 2150 02/02/2020 1921 Full Code [id number]  Doran Heater, DO Inpatient         IV Access:   Peripheral IV   Procedures and diagnostic studies:   VAS  [place] LOWER EXTREMITY VENOUS (DVT)  Result Date: 10/09/2020  Lower Venous DVT Study Patient Name:  Austin Chang  Date of Exam:   10/09/2020 Medical Rec #: [id number]      Accession #:    [id number] Date of Birth: Dec 23, 1965      Patient Gender: M Patient Age:   55 years Exam Location:  [facility] Procedure:      VAS [place] LOWER EXTREMITY VENOUS (DVT) Referring Phys: Bess Harvest ORTIZ --------------------------------------------------------------------------------  Indications: FUO.  Limitations: Poor ultrasound/tissue interface. Comparison Study: No prior studies. Performing Technologist: Oliver Hum RVT  Examination Guidelines: A complete evaluation includes B-mode imaging, spectral Doppler, color Doppler, and power Doppler as needed of all accessible portions of each vessel. Bilateral testing is considered an integral part of a complete examination. Limited examinations for reoccurring indications may be performed as noted. The reflux portion of the exam is performed with the patient in reverse Trendelenburg.  +---------+---------------+---------+-----------+----------+--------------+ RIGHT    CompressibilityPhasicitySpontaneityPropertiesThrombus Aging +---------+---------------+---------+-----------+----------+--------------+ CFV      Full           Yes      Yes                                 +---------+---------------+---------+-----------+----------+--------------+  SFJ      Full                                                        +---------+---------------+---------+-----------+----------+--------------+ FV Prox  Full                                                        +---------+---------------+---------+-----------+----------+--------------+ FV Mid                  Yes      Yes                                 +---------+---------------+---------+-----------+----------+--------------+ FV Distal               Yes      Yes                                  +---------+---------------+---------+-----------+----------+--------------+ PFV      Full                                                        +---------+---------------+---------+-----------+----------+--------------+ POP      Full           Yes      Yes                                 +---------+---------------+---------+-----------+----------+--------------+ PTV      Full                                                        +---------+---------------+---------+-----------+----------+--------------+ PERO     Full                                                        +---------+---------------+---------+-----------+----------+--------------+   +---------+---------------+---------+-----------+----------+--------------+ LEFT     CompressibilityPhasicitySpontaneityPropertiesThrombus Aging +---------+---------------+---------+-----------+----------+--------------+ CFV      Full           Yes      Yes                                 +---------+---------------+---------+-----------+----------+--------------+ SFJ      Full                                                        +---------+---------------+---------+-----------+----------+--------------+  FV Prox  Full                                                        +---------+---------------+---------+-----------+----------+--------------+ FV Mid   Full                                                        +---------+---------------+---------+-----------+----------+--------------+ FV Distal               Yes      Yes                                 +---------+---------------+---------+-----------+----------+--------------+ PFV      Full                                                        +---------+---------------+---------+-----------+----------+--------------+ POP      Full           Yes      Yes                                  +---------+---------------+---------+-----------+----------+--------------+ PTV      Full                                                        +---------+---------------+---------+-----------+----------+--------------+ PERO     Full                                                        +---------+---------------+---------+-----------+----------+--------------+     Summary: RIGHT: - There is no evidence of deep vein thrombosis in the lower extremity. However, portions of this examination were limited- see technologist comments above.  - No cystic structure found in the popliteal fossa.  LEFT: - There is no evidence of deep vein thrombosis in the lower extremity. However, portions of this examination were limited- see technologist comments above.  - No cystic structure found in the popliteal fossa.  *See table(s) above for measurements and observations.    Preliminary      Medical Consultants:   None.   Subjective:     DQUAN CORTOPASSI no further fevers  Objective:    Vitals:   10/09/20 1933 10/10/20 0604 10/10/20 0905 10/10/20 1100  BP: 116/64 115/76 125/65 113/69  Pulse: (!) 105 98  (!) 108  Resp: 20 20 20 19   Temp: (!) 97.5 F (36.4 C) 98 F (36.7 C) 98.1 F (36.7 C) (!) 97.3 F (36.3 C)  TempSrc: Oral Oral Oral Oral  SpO2: 97% 98%  97%  Weight:  118.2 kg    Height:       SpO2: 97 % O2 Flow Rate (L/min): 2 L/min   Intake/Output Summary (Last 24 hours) at 10/10/2020 1139 Last data filed at 10/10/2020 0905 Gross per 24 hour  Intake 120 ml  Output 1325 ml  Net -1205 ml    Filed Weights   10/08/20 0400 10/09/20 0509 10/10/20 0604  Weight: 117.1 kg 117.4 kg 118.2 kg    Exam: General exam: In no acute distress. Respiratory system: Good air movement and clear to auscultation. Cardiovascular system: S1 & S2 heard, RRR. No JVD. Gastrointestinal system: Abdomen is nondistended, soft and nontender.  Extremities: No pedal edema. Skin: No rashes, lesions or  ulcers   Data Reviewed:    Labs: Basic Metabolic Panel: Recent Labs  Lab 10/06/20 1408 10/07/20 0252 10/08/20 0245 10/09/20 0104  NA 123* 125* 127* 123*  K 4.0 3.6 4.1 4.0  CL 88* 89* 92* 92*  CO2 27 25 26 22   GLUCOSE 153* 135* 129* 104*  BUN 10 11 14 19   CREATININE 1.08 1.07 1.13 1.14  CALCIUM 8.6* 8.6* 9.1 8.6*  MG  --  1.7  --   --   PHOS  --  3.4  --   --     GFR Estimated Creatinine Clearance: 89.3 mL/min (by C-G formula based on SCr of 1.14 mg/dL). Liver Function Tests: Recent Labs  Lab 10/06/20 1408  AST 39  ALT 30  ALKPHOS 111  BILITOT 1.1  PROT 7.3  ALBUMIN 3.3*    No results for input(s): LIPASE, AMYLASE in the last 168 hours. No results for input(s): AMMONIA in the last 168 hours. Coagulation profile No results for input(s): INR, PROTIME in the last 168 hours. COVID-19 Labs  Recent Labs    10/09/20 1424  FERRITIN 979*  LDH 198*  CRP 32.2*     Lab Results  Component Value Date   SARSCOV2NAA NEGATIVE 10/06/2020   SARSCOV2NAA NEGATIVE 10/06/2020   SARSCOV2NAA POSITIVE (A) 01/30/2020    CBC: Recent Labs  Lab 10/06/20 1408 10/07/20 0252 10/08/20 0245 10/08/20 0313 10/09/20 0104 10/09/20 0954  WBC 8.3 9.6 THIS TEST WAS ORDERED IN ERROR AND HAS BEEN CREDITED. 11.8* 12.2* 12.4*  NEUTROABS 6.7  --   --  9.2*  --  10.7*  HGB 14.1 13.0 THIS TEST WAS ORDERED IN ERROR AND HAS BEEN CREDITED. 13.6 12.5* 12.2*  HCT 38.1* 36.2* THIS TEST WAS ORDERED IN ERROR AND HAS BEEN CREDITED. 38.5* 34.8* 35.6*  MCV 90.9 92.6 THIS TEST WAS ORDERED IN ERROR AND HAS BEEN CREDITED. 94.1 93.5 94.7  PLT 153 160 THIS TEST WAS ORDERED IN ERROR AND HAS BEEN CREDITED. 174 196 220    Cardiac Enzymes: Recent Labs  Lab 10/09/20 1424  CKTOTAL 55   BNP (last 3 results) No results for input(s): PROBNP in the last 8760 hours. CBG: No results for input(s): GLUCAP in the last 168 hours. D-Dimer: No results for input(s): DDIMER in the last 72 hours.  Hgb A1c: No  results for input(s): HGBA1C in the last 72 hours. Lipid Profile: No results for input(s): CHOL, HDL, LDLCALC, TRIG, CHOLHDL, LDLDIRECT in the last 72 hours. Thyroid function studies: No results for input(s): TSH, T4TOTAL, T3FREE, THYROIDAB in the last 72 hours.  Invalid input(s): FREET3 Anemia work up: Recent Labs    10/09/20 [place]*   Sepsis Labs: Recent Labs  Lab 10/06/20 1820 10/06/20 1922 10/07/20 0252 10/08/20 0245 10/08/20  0102 10/09/20 0104 10/09/20 0954  PROCALCITON 1.96  --   --  10.81  --   --   --   WBC  --   --    < > THIS TEST WAS ORDERED IN ERROR AND HAS BEEN CREDITED. 11.8* 12.2* 12.4*  LATICACIDVEN  --  1.4  --   --   --   --   --    < > = values in this interval not displayed.    Microbiology Recent Results (from the past 240 hour(s))  Resp Panel by RT-PCR (Flu A&B, Covid) Nasopharyngeal Swab     Status: None   Collection Time: 10/06/20  2:08 PM   Specimen: Nasopharyngeal Swab; Nasopharyngeal(NP) swabs in vial transport medium  Result Value Ref Range Status   SARS Coronavirus 2 by RT PCR NEGATIVE NEGATIVE Final    Comment: (NOTE) SARS-CoV-2 target nucleic acids are NOT DETECTED.  The SARS-CoV-2 RNA is generally detectable in upper respiratory specimens during the acute phase of infection. The lowest concentration of SARS-CoV-2 viral copies this assay can detect is 138 copies/mL. A negative result does not preclude SARS-Cov-2 infection and should not be used as the sole basis for treatment or other patient management decisions. A negative result may occur with  improper specimen collection/handling, submission of specimen other than nasopharyngeal swab, presence of viral mutation(s) within the areas targeted by this assay, and inadequate number of viral copies(<138 copies/mL). A negative result must be combined with clinical observations, patient history, and epidemiological information. The expected result is Negative.  Fact Sheet for  Patients:  EntrepreneurPulse.com.au  Fact Sheet for Healthcare Providers:  IncredibleEmployment.be  This test is no t yet approved or cleared by the [place] FDA and  has been authorized for detection and/or diagnosis of SARS-CoV-2 by FDA under an Emergency Use Authorization (EUA). This EUA will remain  in effect (meaning this test can be used) for the duration of the COVID-19 declaration under Section 564(b)(1) of the Act, 21 U.S.C.section 360bbb-3(b)(1), unless the authorization is terminated  or revoked sooner.       Influenza A by PCR NEGATIVE NEGATIVE Final   Influenza B by PCR NEGATIVE NEGATIVE Final    Comment: (NOTE) The Xpert Xpress SARS-CoV-2/FLU/RSV plus assay is intended as an aid in the diagnosis of influenza from Nasopharyngeal swab specimens and should not be used as a sole basis for treatment. Nasal washings and aspirates are unacceptable for Xpert Xpress SARS-CoV-2/FLU/RSV testing.  Fact Sheet for Patients: EntrepreneurPulse.com.au  Fact Sheet for Healthcare Providers: IncredibleEmployment.be  This test is not yet approved or cleared by the [place] FDA and has been authorized for detection and/or diagnosis of SARS-CoV-2 by FDA under an Emergency Use Authorization (EUA). This EUA will remain in effect (meaning this test can be used) for the duration of the COVID-19 declaration under Section 564(b)(1) of the Act, 21 U.S.C. section 360bbb-3(b)(1), unless the authorization is terminated or revoked.  Performed at [facility], [address] [place], [place] [address]   Blood culture (routine x 2)     Status: None (Preliminary result)   Collection Time: 10/06/20  7:10 PM   Specimen: Right Antecubital; Blood  Result Value Ref Range Status   Specimen Description   Final    RIGHT ANTECUBITAL Performed at [facility], [address] [place], [place]  [address]    Special Requests   Final    BOTTLES DRAWN AEROBIC AND ANAEROBIC Blood Culture results may  not be optimal due to an inadequate volume of blood received in culture bottles Performed at [facility], [address] [place], [place] [address]    Culture   Final    NO GROWTH 3 DAYS Performed at [facility], [place] [address] [place], [place] [address]    Report Status PENDING  Incomplete  Blood culture (routine x 2)     Status: None (Preliminary result)   Collection Time: 10/06/20  7:24 PM   Specimen: BLOOD RIGHT WRIST  Result Value Ref Range Status   Specimen Description   Final    BLOOD RIGHT WRIST Performed at [facility], [address] [place], [place] [address]    Special Requests   Final    BOTTLES DRAWN AEROBIC AND ANAEROBIC Blood Culture adequate volume Performed at [facility], [address] [place], [place] [address]    Culture   Final    NO GROWTH 3 DAYS Performed at [facility], [place] [address] [place], [place] [address]    Report Status PENDING  Incomplete  Respiratory (~20 pathogens) panel by PCR     Status: None   Collection Time: 10/06/20  7:51 PM   Specimen: Nasopharyngeal Swab; Respiratory  Result Value Ref Range Status   Adenovirus NOT DETECTED NOT DETECTED Final   Coronavirus 229E NOT DETECTED NOT DETECTED Final    Comment: (NOTE) The Coronavirus on the Respiratory Panel, DOES NOT test for the novel  Coronavirus (2019 nCoV)    Coronavirus HKU1 NOT DETECTED NOT DETECTED Final   Coronavirus NL63 NOT DETECTED NOT DETECTED Final   Coronavirus OC43 NOT DETECTED NOT DETECTED Final   Metapneumovirus NOT DETECTED NOT DETECTED Final   Rhinovirus / Enterovirus NOT DETECTED NOT DETECTED Final   Influenza A NOT DETECTED NOT DETECTED Final   Influenza B NOT DETECTED NOT DETECTED Final   Parainfluenza Virus 1 NOT DETECTED NOT DETECTED Final   Parainfluenza Virus 2 NOT DETECTED NOT DETECTED Final    Parainfluenza Virus 3 NOT DETECTED NOT DETECTED Final   Parainfluenza Virus 4 NOT DETECTED NOT DETECTED Final   Respiratory Syncytial Virus NOT DETECTED NOT DETECTED Final   Bordetella pertussis NOT DETECTED NOT DETECTED Final   Bordetella Parapertussis NOT DETECTED NOT DETECTED Final   Chlamydophila pneumoniae NOT DETECTED NOT DETECTED Final   Mycoplasma pneumoniae NOT DETECTED NOT DETECTED Final    Comment: Performed at [facility], [place]. [address] [place], [place] [address]  Resp Panel by RT-PCR (Flu A&B, Covid) Nasopharyngeal Swab     Status: None   Collection Time: 10/06/20  7:51 PM   Specimen: Nasopharyngeal Swab; Nasopharyngeal(NP) swabs in vial transport medium  Result Value Ref Range Status   SARS Coronavirus 2 by RT PCR NEGATIVE NEGATIVE Final    Comment: (NOTE) SARS-CoV-2 target nucleic acids are NOT DETECTED.  The SARS-CoV-2 RNA is generally detectable in upper respiratory specimens during the acute phase of infection. The lowest concentration of SARS-CoV-2 viral copies this assay can detect is 138 copies/mL. A negative result does not preclude SARS-Cov-2 infection and should not be used as the sole basis for treatment or other patient management decisions. A negative result may occur with  improper specimen collection/handling, submission of specimen other than nasopharyngeal swab, presence of viral mutation(s) within the areas targeted by this assay, and inadequate number of viral copies(<138 copies/mL). A negative result must be combined with clinical observations, patient history, and epidemiological information.  The expected result is Negative.  Fact Sheet for Patients:  EntrepreneurPulse.com.au  Fact Sheet for Healthcare Providers:  IncredibleEmployment.be  This test is no t yet approved or cleared by the [place] FDA and  has been authorized for detection and/or diagnosis of SARS-CoV-2 by FDA under an Emergency Use  Authorization (EUA). This EUA will remain  in effect (meaning this test can be used) for the duration of the COVID-19 declaration under Section 564(b)(1) of the Act, 21 U.S.C.section 360bbb-3(b)(1), unless the authorization is terminated  or revoked sooner.       Influenza A by PCR NEGATIVE NEGATIVE Final   Influenza B by PCR NEGATIVE NEGATIVE Final    Comment: (NOTE) The Xpert Xpress SARS-CoV-2/FLU/RSV plus assay is intended as an aid in the diagnosis of influenza from Nasopharyngeal swab specimens and should not be used as a sole basis for treatment. Nasal washings and aspirates are unacceptable for Xpert Xpress SARS-CoV-2/FLU/RSV testing.  Fact Sheet for Patients: EntrepreneurPulse.com.au  Fact Sheet for Healthcare Providers: IncredibleEmployment.be  This test is not yet approved or cleared by the [place] FDA and has been authorized for detection and/or diagnosis of SARS-CoV-2 by FDA under an Emergency Use Authorization (EUA). This EUA will remain in effect (meaning this test can be used) for the duration of the COVID-19 declaration under Section 564(b)(1) of the Act, 21 U.S.C. section 360bbb-3(b)(1), unless the authorization is terminated or revoked.  Performed at [facility], [address] [place], [place] [address]   MRSA Next Gen by PCR, Nasal     Status: None   Collection Time: 10/07/20 11:53 AM   Specimen: Nasal Mucosa; Nasal Swab  Result Value Ref Range Status   MRSA by PCR Next Gen NOT DETECTED NOT DETECTED Final    Comment: (NOTE) The GeneXpert MRSA Assay (FDA approved for NASAL specimens only), is one component of a comprehensive MRSA colonization surveillance program. It is not intended to diagnose MRSA infection nor to guide or monitor treatment for MRSA infections. Test performance is not FDA approved in patients less than 47 years old. Performed at [facility], [place] [address] [place],  [place] [address]      Medications:    allopurinol  300 mg Oral Daily   amLODipine  10 mg Oral Daily   enoxaparin (LOVENOX) injection  40 mg Subcutaneous Daily   iohexol       metroNIDAZOLE  500 mg Oral Q12H   nystatin  5 mL Oral QID   Continuous Infusions:  sodium chloride 10 mL/hr at 10/09/20 0636   ceFEPime (MAXIPIME) IV 2 g (10/10/20 0650)      LOS: 4 days   Charlynne Cousins  [facility]  10/10/2020, 11:39 AM

## 2020-10-10 NOTE — Progress Notes (Signed)
Physical Therapy Treatment Patient Details Name: Austin Chang MRN: [id number] DOB: 1965/08/01 Today's Date: 10/10/2020   History of Present Illness Austin Chang is an 55 y.o. male past medical history significant for ulcerative colitis on Humira BMI of 38 essential hypertension ankylosing spondylitis presents to the ED for evaluation of fever fatigue loss of appetite that began on 10/02/2020, in the ED he was found to be febrile satting 90 tachycardic and tachypneic, mild hyponatremia elevated procalcitonin CTA negative for PE.  Admitted for sepsis due to CAP.    PT Comments    Plan was to increase ambulation and complete stair negotiation with patient this date however at 55 feet of amb pt's HR increased to 205 bpm (increased gradually from 109bpm) requiring pt to sit and rest. Unsafe to attempt stairs at this time however suspect pt could physically complete but unsure if HR would be appropriate. Pt given seated LE exercise HEP to improve activity tolerance. Acute PT to cont to follow.    Recommendations for follow up therapy are one component of a multi-disciplinary discharge planning process, led by the attending physician.  Recommendations may be updated based on patient status, additional functional criteria and insurance authorization.  Follow Up Recommendations  No PT follow up     Equipment Recommendations  None recommended by PT    Recommendations for Other Services       Precautions / Restrictions Precautions Precautions: Fall Precaution Comments: watch HR, inc to 205bpm during amb Restrictions Weight Bearing Restrictions: No     Mobility  Bed Mobility               General bed mobility comments: pt up in chair    Transfers Overall transfer level: Needs assistance Equipment used: None Transfers: Sit to/from Stand Sit to Stand: Supervision         General transfer comment: verbal cues to make sure chair is locked  Ambulation/Gait Ambulation/Gait  assistance: Min guard Gait Distance (Feet): 53 Feet Assistive device: IV Pole Gait Pattern/deviations: Step-through pattern;Decreased stride length Gait velocity: dec Gait velocity interpretation: <1.31 ft/sec, indicative of household ambulator General Gait Details: pt more steady compared to PT eval however pt's HR increased from 109bpm to 205bpm requiring pt to sit and rest, within 2 min pt's HR at 111bpm, took a total of a 4 min seated rest break and them amb back to room , HR increased to 140bpm and request to sit due to "I'm so tired, I can feel my heart beating fast" (another 55 feet amb bout)   Stairs Stairs:  (unable to complete due to HR inc to 205bpm with amb only,)           Wheelchair Mobility    Modified Rankin (Stroke Patients Only)       Balance Overall balance assessment: Needs assistance   Sitting balance-Leahy Scale: Good     Standing balance support: No upper extremity supported Standing balance-Leahy Scale: Fair Standing balance comment: static balance without UE support with S, with mobility needs UE assist                            Cognition Arousal/Alertness: Awake/alert Behavior During Therapy: WFL for tasks assessed/performed Overall Cognitive Status: Within Functional Limits for tasks assessed  Exercises Other Exercises Other Exercises: pt given HEP of hip flexion, LAQ with 5 sec hold, and heel/toe raises while in sitting to improve activity tolerance until pt can't tolerate increased amb distance    General Comments General comments (skin integrity, edema, etc.): HR inc to 205bpm during amb, SpO2 >95% on RA, RN alerted      Pertinent Vitals/Pain Pain Assessment: No/denies pain    Home Living                      Prior Function            PT Goals (current goals can now be found in the care plan section) Progress towards PT goals: Not progressing toward  goals - comment (due to inc HR to 205bpm)    Frequency    Min 3X/week      PT Plan Current plan remains appropriate    Co-evaluation              AM-PAC PT "6 Clicks" Mobility   Outcome Measure  Help needed turning from your back to your side while in a flat bed without using bedrails?: None Help needed moving from lying on your back to sitting on the side of a flat bed without using bedrails?: None Help needed moving to and from a bed to a chair (including a wheelchair)?: None Help needed standing up from a chair using your arms (e.g., wheelchair or bedside chair)?: None Help needed to walk in hospital room?: A Little Help needed climbing 3-5 steps with a railing? : A Little 6 Click Score: 22    End of Session   Activity Tolerance: Treatment limited secondary to medical complications (Comment) (limited by increased HR to 205bpm during amb) Patient left: in chair;with call bell/phone within reach Nurse Communication: Mobility status;Other (comment) (increased HR to 205bpm) PT Visit Diagnosis: Other abnormalities of gait and mobility (R26.89);Muscle weakness (generalized) (M62.81)     Time: 0272-5366 PT Time Calculation (min) (ACUTE ONLY): 21 min  Charges:  $Gait Training: 8-22 mins                     Kittie Plater, PT, DPT Acute Rehabilitation Services Pager #: [phone] Office #: [phone]    Berline Lopes 10/10/2020, 11:05 AM

## 2020-10-10 NOTE — Progress Notes (Signed)
[facility]  Date of Admission:  10/06/2020           Reason for visit: Follow up on fevers  Current antibiotics: Cefepime 9/8-present Metronidazole 9/8-9/10; 9/12-present  Previous antibiotics: Azithromycin x1 dose 9/8  Ceftriaxone x1 dose 9/8 Doxycycline 9/10-9/11 Vancomycin 9/8-9/9   ASSESSMENT:    55 y.o. 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, colostomy, drainage of pelvic abscess and appendectomy 2012 Thrush  RECOMMENDATIONS:    Await CT abdomen/pelvis with contrast Await 2D echo Follow-up labs ordered yesterday We will also check urine histo antigen in the setting of fevers and Humira use Continue cefepime and add metronidazole for possible undiagnosed intra-abdominal infection Continue nystatin swish and swallow Will follow   Principal Problem:   Sepsis ([name]) Active Problems:   Essential hypertension   Inflammatory bowel disease   Hyponatremia   Bilateral pneumonia    MEDICATIONS:    Scheduled Meds:  allopurinol  300 mg Oral Daily   amLODipine  10 mg Oral Daily   enoxaparin (LOVENOX) injection  40 mg Subcutaneous Daily   iohexol       metroNIDAZOLE  500 mg Oral Q12H   nystatin  5 mL Oral QID   Continuous Infusions:  sodium chloride 10 mL/hr at 10/09/20 0636   ceFEPime (MAXIPIME) IV 2 g (10/10/20 0650)   PRN Meds:.sodium chloride, acetaminophen, alum & mag hydroxide-simeth, ondansetron **OR** ondansetron (ZOFRAN) IV, oxyCODONE, zolpidem  SUBJECTIVE:   24 hour events:  No acute events noted overnight Improved fever curve with T-max over the last 24 hours 98.2.  Last recorded fever at 7 AM on 10/09/2020. Multiple labs pending with negative hepatitis panel, RPR, CK, and HIV.  ESR elevated at  80 and CRP 32.2.  CT of the abdomen and pelvis is pending.  CTA of the chest 10/06/2020 was negative.  Patient reports feeling better today.  No fevers this morning.  No new symptoms.  States back pain is from hospital bed and was not present on admission.  He has not required acetaminophen since 6:30 AM on 9/11.  Review of Systems  All other systems reviewed and are negative.    OBJECTIVE:   Blood pressure 113/69, pulse (!) 108, temperature (!) 97.3 F (36.3 C), temperature source Oral, resp. rate 19, height 5' 8"  (1.727 m), weight 118.2 kg, SpO2 97 %. Body mass index is 39.61 kg/m.  Physical Exam Constitutional:      General: He is not in acute distress.    Appearance: Normal appearance.     Comments: Sitting up in recliner.  No acute distress.   HENT:     Head: Normocephalic and atraumatic.  Eyes:     Extraocular Movements: Extraocular movements intact.     Conjunctiva/sclera: Conjunctivae normal.  Cardiovascular:     Rate and Rhythm: Normal rate and regular rhythm.  Pulmonary:     Effort: Pulmonary effort is normal. No respiratory distress.     Breath sounds: Normal breath sounds.  Abdominal:     General: There is no distension.     Palpations: Abdomen is soft.     Tenderness: There is no abdominal tenderness.  Musculoskeletal:        General: Normal range of motion.     Cervical back: Normal range of motion  and neck supple.  Skin:    General: Skin is warm and dry.     Findings: No rash.  Neurological:     General: No focal deficit present.     Mental Status: He is alert and oriented to person, place, and time.  Psychiatric:        Mood and Affect: Mood normal.        Behavior: Behavior normal.     Lab Results: Lab Results  Component Value Date   WBC 12.4 (H) 10/09/2020   HGB 12.2 (L) 10/09/2020   HCT 35.6 (L) 10/09/2020   MCV 94.7 10/09/2020   PLT 220 10/09/2020    Lab Results  Component Value Date   NA 123 (L) 10/09/2020   K 4.0 10/09/2020   CO2 22  10/09/2020   GLUCOSE 104 (H) 10/09/2020   BUN 19 10/09/2020   CREATININE 1.14 10/09/2020   CALCIUM 8.6 (L) 10/09/2020   GFRNONAA >60 10/09/2020   GFRAA >60 08/18/2010    Lab Results  Component Value Date   ALT 30 10/06/2020   AST 39 10/06/2020   ALKPHOS 111 10/06/2020   BILITOT 1.1 10/06/2020       Component Value Date/Time   CRP 32.2 (H) 10/09/2020 1424       Component Value Date/Time   ESRSEDRATE 80 (H) 10/09/2020 1424     I have reviewed the micro and lab results in Epic.  Imaging: VAS [place] LOWER EXTREMITY VENOUS (DVT)  Result Date: 10/09/2020  Lower Venous DVT Study Patient Name:  Austin Chang  Date of Exam:   10/09/2020 Medical Rec #: [id number]      Accession #:    [id number] Date of Birth: Jan 27, 1966      Patient Gender: M Patient Age:   66 years Exam Location:  [facility] Procedure:      VAS [place] LOWER EXTREMITY VENOUS (DVT) Referring Phys: Bess Harvest ORTIZ --------------------------------------------------------------------------------  Indications: FUO.  Limitations: Poor ultrasound/tissue interface. Comparison Study: No prior studies. Performing Technologist: Oliver Hum RVT  Examination Guidelines: A complete evaluation includes B-mode imaging, spectral Doppler, color Doppler, and power Doppler as needed of all accessible portions of each vessel. Bilateral testing is considered an integral part of a complete examination. Limited examinations for reoccurring indications may be performed as noted. The reflux portion of the exam is performed with the patient in reverse Trendelenburg.  +---------+---------------+---------+-----------+----------+--------------+ RIGHT    CompressibilityPhasicitySpontaneityPropertiesThrombus Aging +---------+---------------+---------+-----------+----------+--------------+ CFV      Full           Yes      Yes                                 +---------+---------------+---------+-----------+----------+--------------+ SFJ       Full                                                        +---------+---------------+---------+-----------+----------+--------------+ FV Prox  Full                                                        +---------+---------------+---------+-----------+----------+--------------+  FV Mid                  Yes      Yes                                 +---------+---------------+---------+-----------+----------+--------------+ FV Distal               Yes      Yes                                 +---------+---------------+---------+-----------+----------+--------------+ PFV      Full                                                        +---------+---------------+---------+-----------+----------+--------------+ POP      Full           Yes      Yes                                 +---------+---------------+---------+-----------+----------+--------------+ PTV      Full                                                        +---------+---------------+---------+-----------+----------+--------------+ PERO     Full                                                        +---------+---------------+---------+-----------+----------+--------------+   +---------+---------------+---------+-----------+----------+--------------+ LEFT     CompressibilityPhasicitySpontaneityPropertiesThrombus Aging +---------+---------------+---------+-----------+----------+--------------+ CFV      Full           Yes      Yes                                 +---------+---------------+---------+-----------+----------+--------------+ SFJ      Full                                                        +---------+---------------+---------+-----------+----------+--------------+ FV Prox  Full                                                        +---------+---------------+---------+-----------+----------+--------------+ FV Mid   Full                                                         +---------+---------------+---------+-----------+----------+--------------+  FV Distal               Yes      Yes                                 +---------+---------------+---------+-----------+----------+--------------+ PFV      Full                                                        +---------+---------------+---------+-----------+----------+--------------+ POP      Full           Yes      Yes                                 +---------+---------------+---------+-----------+----------+--------------+ PTV      Full                                                        +---------+---------------+---------+-----------+----------+--------------+ PERO     Full                                                        +---------+---------------+---------+-----------+----------+--------------+     Summary: RIGHT: - There is no evidence of deep vein thrombosis in the lower extremity. However, portions of this examination were limited- see technologist comments above.  - No cystic structure found in the popliteal fossa.  LEFT: - There is no evidence of deep vein thrombosis in the lower extremity. However, portions of this examination were limited- see technologist comments above.  - No cystic structure found in the popliteal fossa.  *See table(s) above for measurements and observations.    Preliminary      Imaging  independently reviewed in Epic.    [facility] [phone] pager 10/10/2020, 11:15 AM  I spent greater than 35 minutes with the patient including greater than 50% of time in face to face counsel of the patient and in coordination of their care.

## 2020-10-11 DIAGNOSIS — B370 Candidal stomatitis: Secondary | ICD-10-CM | POA: Diagnosis not present

## 2020-10-11 DIAGNOSIS — K529 Noninfective gastroenteritis and colitis, unspecified: Secondary | ICD-10-CM | POA: Diagnosis not present

## 2020-10-11 DIAGNOSIS — R509 Fever, unspecified: Secondary | ICD-10-CM | POA: Diagnosis not present

## 2020-10-11 LAB — BASIC METABOLIC PANEL
Anion gap: 8 (ref 5–15)
BUN: 16 mg/dL (ref 6–20)
CO2: 22 mmol/L (ref 22–32)
Calcium: 8.2 mg/dL — ABNORMAL LOW (ref 8.9–10.3)
Chloride: 88 mmol/L — ABNORMAL LOW (ref 98–111)
Creatinine, Ser: 0.93 mg/dL (ref 0.61–1.24)
GFR, Estimated: >60 mL/min (ref >60)
Glucose, Bld: 118 mg/dL — ABNORMAL HIGH (ref 70–99)
Potassium: 4 mmol/L (ref 3.5–5.1)
Sodium: 118 mmol/L — CL (ref 135–145)

## 2020-10-11 LAB — ANTI-DNA ANTIBODY, DOUBLE-STRANDED: ds DNA Ab: 1 IU/mL (ref 0–9)

## 2020-10-11 LAB — ANGIOTENSIN CONVERTING ENZYME: Angiotensin-Converting Enzyme: 40 U/L (ref 14–82)

## 2020-10-11 LAB — CULTURE, BLOOD (ROUTINE X 2)
Culture: NO GROWTH
Culture: NO GROWTH
Special Requests: ADEQUATE

## 2020-10-11 LAB — CMV IGM: CMV IgM: <30 AU/mL (ref 0.0–29.9)

## 2020-10-11 LAB — CMV ANTIBODY, IGG (EIA): CMV Ab - IgG: <0.6 U/mL (ref 0.00–0.59)

## 2020-10-11 LAB — RHEUMATOID FACTOR: Rheumatoid fact SerPl-aCnc: 13.8 IU/mL (ref <14.0)

## 2020-10-11 MED ORDER — INSULIN ASPART 100 UNIT/ML IJ SOLN: 10.0000 [IU] | Freq: Once | INTRAMUSCULAR | Status: DC

## 2020-10-11 MED ORDER — FUROSEMIDE 10 MG/ML IJ SOLN
40.0000 mg | Freq: Once | INTRAMUSCULAR | Status: AC
  Administered 2020-10-11: 40 mg via INTRAVENOUS
  Filled 2020-10-11: qty 4

## 2020-10-11 NOTE — Progress Notes (Signed)
[facility]  Date of Admission:  10/06/2020           Reason for visit: Follow up on fevers  Current antibiotics: Cefepime 9/8-present Metronidazole 9/8-9/10; 9/12-present   Previous antibiotics: Azithromycin x1 dose 9/8  Ceftriaxone x1 dose 9/8 Doxycycline 9/10-9/11 Vancomycin 9/8-9/9  ASSESSMENT:    55 y.o. 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 TTE without significant abnormalities. Follow-up labs ordered including urine histo antigen in the setting of fevers and Humira use Will continue cefepime for 1 more day and if continues to be afebrile we will stop and observe. Discontinue metronidazole given no evidence of intra-abdominal infection. Continue nystatin swish and swallow Will follow   Principal Problem:   Sepsis ([place]) Active Problems:   Essential hypertension   Inflammatory bowel disease   Hyponatremia   Bilateral pneumonia    MEDICATIONS:    Scheduled Meds:  allopurinol  300 mg Oral Daily   amLODipine  10 mg Oral Daily   enoxaparin (LOVENOX) injection  40 mg Subcutaneous Daily   metroNIDAZOLE  500 mg Oral Q12H   nystatin  5 mL Oral QID   Continuous Infusions:  sodium chloride 10 mL/hr at 10/09/20 0636   sodium chloride 75 mL/hr at 10/11/20 0606   ceFEPime (MAXIPIME) IV 2 g (10/11/20 3419)   PRN Meds:.sodium chloride, acetaminophen, alum & mag hydroxide-simeth, ondansetron **OR** ondansetron (ZOFRAN) IV, oxyCODONE, zolpidem  SUBJECTIVE:   24 hour events:  Afebrile T-max 99.9 F CT of the abdomen and  pelvis with contrast completed yesterday with no acute intra-abdominal pathology noticed.  He had noted cholelithiasis and mild hepatic steatosis. Sodium this morning 118 No other new labs Histoplasma urine antigen pending Other FUO labs also pending including CMV and other autoimmune labs   Patient reports feeling better again today.  No further fevers.  He has no other localizing symptoms.  He says his back pain is improved as well.  Review of Systems  All other systems reviewed and are negative.    OBJECTIVE:   Blood pressure 140/73, pulse (!) 112, temperature 99.9 F (37.7 C), temperature source Oral, resp. rate (!) 117, height 5' 8"  (1.727 m), weight 118.2 kg, SpO2 93 %. Body mass index is 39.61 kg/m.  Physical Exam Constitutional:      General: He is not in acute distress.    Appearance: Normal appearance.  HENT:     Head: Normocephalic and atraumatic.  Eyes:     Extraocular Movements: Extraocular movements intact.     Conjunctiva/sclera: Conjunctivae normal.  Cardiovascular:     Rate and Rhythm: Normal rate and regular rhythm.  Pulmonary:     Effort: Pulmonary effort is normal. No respiratory distress.     Comments: Breath sounds diminished Abdominal:     General: There is no distension.     Palpations: Abdomen is soft.     Tenderness: There is no abdominal tenderness.  Musculoskeletal:        General: Normal range of motion.  Skin:    General: Skin is warm and dry.     Findings: No rash.  Neurological:     General: No focal deficit present.     Mental Status: He is alert and oriented to person, place, and time.  Psychiatric:        Mood and Affect: Mood normal.        Behavior: Behavior normal.     Lab Results: Lab Results  Component Value Date   WBC 12.4 (H) 10/09/2020   HGB 12.2 (L) 10/09/2020   HCT 35.6 (L) 10/09/2020   MCV 94.7 10/09/2020   PLT 220 10/09/2020    Lab Results  Component Value Date   NA 118 (LL) 10/11/2020   K 4.0 10/11/2020    CO2 22 10/11/2020   GLUCOSE 118 (H) 10/11/2020   BUN 16 10/11/2020   CREATININE 0.93 10/11/2020   CALCIUM 8.2 (L) 10/11/2020   GFRNONAA >60 10/11/2020   GFRAA >60 08/18/2010    Lab Results  Component Value Date   ALT 30 10/06/2020   AST 39 10/06/2020   ALKPHOS 111 10/06/2020   BILITOT 1.1 10/06/2020       Component Value Date/Time   CRP 32.2 (H) 10/09/2020 1424       Component Value Date/Time   ESRSEDRATE 80 (H) 10/09/2020 1424     I have reviewed the micro and lab results in Epic.  Imaging: CT ABDOMEN PELVIS W CONTRAST  Result Date: 10/10/2020 CLINICAL DATA:  Abdominal pain, fever EXAM: CT ABDOMEN AND PELVIS WITH CONTRAST TECHNIQUE: Multidetector CT imaging of the abdomen and pelvis was performed using the standard protocol following bolus administration of intravenous contrast. CONTRAST:  173m OMNIPAQUE IOHEXOL 350 MG/ML SOLN COMPARISON:  02/20/2010 FINDINGS: Lower chest: Mild bibasilar atelectasis. Cardiac size within normal limits. No pericardial effusion. Hepatobiliary: Mild hepatic steatosis. No enhancing intrahepatic mass. No intra or extrahepatic biliary ductal dilation. Cholelithiasis without pericholecystic inflammatory change noted. Pancreas: Unremarkable Spleen: Unremarkable Adrenals/Urinary Tract: Adrenal glands are unremarkable. Kidneys are normal, without renal calculi, focal lesion, or hydronephrosis. 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 The bladder is otherwise unremarkable. Stomach/Bowel: The stomach, small bowel, and large bowel are unremarkable. Appendix is absent. No free intraperitoneal gas or fluid. Vascular/Lymphatic: Mild aortoiliac atherosclerotic calcification. No aortic aneurysm. No pathologic adenopathy within the abdomen and pelvis. Reproductive: Prostate is unremarkable. Other: Diastasis of the rectus abdominus musculature noted. No abdominal wall  hernia. The rectum is unremarkable. Musculoskeletal: Changes of ankylosing spondylitis are again identified with ankylosis of the facets of the lumbar spine sacroiliac joints bilaterally as well as flowing syndesmophytes noted throughout the lumbar spine. No acute abnormality. IMPRESSION: No acute intra-abdominal pathology identified. No definite radiographic explanation for the patient's reported symptoms. Cholelithiasis. Mild hepatic steatosis. Tethering of the bladder anteriorly likely related to post inflammatory adhesions. No evidence of bladder outlet obstruction. Chronic changes of ankylosing spondylitis Aortic Atherosclerosis (ICD10-I70.0). Electronically Signed   By: AFidela SalisburyM.D.   On: 10/10/2020 17:16   ECHOCARDIOGRAM COMPLETE  Result Date: 10/10/2020    ECHOCARDIOGRAM REPORT   Patient Name:   Austin BRUUNDate of Exam: 10/10/2020 Medical Rec #:  [id number]    Height:       68.0 in Accession #:    [id number]   Weight:       260.5 lb Date of Birth:  609-15-1967    BSA:          2.287 m Patient Age:    89years  BP:           113/69 mmHg Patient Gender: M             HR:           104 bpm. Exam Location:  Inpatient Procedure: 2D Echo, Cardiac Doppler, Color Doppler and Intracardiac            Opacification Agent Indications:    Fever  History:        Patient has no prior history of Echocardiogram examinations.                 Signs/Symptoms:Fever. Covid 19. PNA. Fatigue.  Sonographer:    Merrie Roof RDCS Referring Phys: Brandon  1. Left ventricular ejection fraction, by estimation, is 65 to 70%. The left ventricle has normal function. The left ventricle has no regional wall motion abnormalities. Left ventricular diastolic parameters are consistent with Grade I diastolic dysfunction (impaired relaxation).  2. Right ventricular systolic function is normal. The right ventricular size is normal. There is normal pulmonary artery systolic pressure.  3. The mitral valve  is grossly normal. Trivial mitral valve regurgitation.  4. The aortic valve was not well visualized. Aortic valve regurgitation is not visualized. Aortic valve mean gradient measures 11.0 mmHg.  5. The inferior vena cava is normal in size with greater than 50% respiratory variability, suggesting right atrial pressure of 3 mmHg.  6. Increased flow velocities may be secondary to anemia, thyrotoxicosis, hyperdynamic or high flow state. Comparison(s): No prior Echocardiogram. FINDINGS  Left Ventricle: Left ventricular ejection fraction, by estimation, is 65 to 70%. The left ventricle has normal function. The left ventricle has no regional wall motion abnormalities. The left ventricular internal cavity size was normal in size. There is  no left ventricular hypertrophy. Left ventricular diastolic parameters are consistent with Grade I diastolic dysfunction (impaired relaxation). Indeterminate filling pressures. Right Ventricle: The right ventricular size is normal. No increase in right ventricular wall thickness. Right ventricular systolic function is normal. There is normal pulmonary artery systolic pressure. The tricuspid regurgitant velocity is 2.66 m/s, and  with an assumed right atrial pressure of 3 mmHg, the estimated right ventricular systolic pressure is 16.0 mmHg. Left Atrium: Left atrial size was normal in size. Right Atrium: Right atrial size was normal in size. Pericardium: There is no evidence of pericardial effusion. Mitral Valve: The mitral valve is grossly normal. Trivial mitral valve regurgitation. Tricuspid Valve: The tricuspid valve is grossly normal. Tricuspid valve regurgitation is trivial. Aortic Valve: The aortic valve was not well visualized. Aortic valve regurgitation is not visualized. Aortic valve mean gradient measures 11.0 mmHg. Aortic valve peak gradient measures 18.7 mmHg. Aortic valve area, by VTI measures 1.78 cm. Pulmonic Valve: The pulmonic valve was grossly normal. Pulmonic valve  regurgitation is trivial. Aorta: The aortic root and ascending aorta are structurally normal, with no evidence of dilitation. Venous: The inferior vena cava is normal in size with greater than 50% respiratory variability, suggesting right atrial pressure of 3 mmHg. IAS/Shunts: No atrial level shunt detected by color flow Doppler.  LEFT VENTRICLE PLAX 2D LVIDd:         4.70 cm  Diastology LVIDs:         2.90 cm  LV e' medial:    7.29 cm/s LV PW:         1.10 cm  LV E/e' medial:  13.6 LV IVS:        1.00 cm  LV  e' lateral:   7.51 cm/s LVOT diam:     2.00 cm  LV E/e' lateral: 13.2 LV SV:         70 LV SV Index:   31 LVOT Area:     3.14 cm  RIGHT VENTRICLE RV Basal diam:  4.00 cm LEFT ATRIUM             Index       RIGHT ATRIUM           Index LA diam:        4.50 cm 1.97 cm/m  RA Area:     14.20 cm LA Vol (A2C):   85.9 ml 37.57 ml/m RA Volume:   31.90 ml  13.95 ml/m LA Vol (A4C):   60.0 ml 26.24 ml/m LA Biplane Vol: 75.8 ml 33.15 ml/m  AORTIC VALVE AV Area (Vmax):    2.17 cm AV Area (Vmean):   1.98 cm AV Area (VTI):     1.78 cm AV Vmax:           216.00 cm/s AV Vmean:          158.000 cm/s AV VTI:            0.393 m AV Peak Grad:      18.7 mmHg AV Mean Grad:      11.0 mmHg LVOT Vmax:         149.00 cm/s LVOT Vmean:        99.800 cm/s LVOT VTI:          0.223 m LVOT/AV VTI ratio: 0.57  AORTA Ao Root diam: 3.10 cm Ao Asc diam:  3.20 cm MITRAL VALVE               TRICUSPID VALVE MV Area (PHT): 4.80 cm    TR Peak grad:   28.3 mmHg MV Decel Time: 158 msec    TR Vmax:        266.00 cm/s MV E velocity: 99.40 cm/s MV A velocity: 82.30 cm/s  SHUNTS MV E/A ratio:  1.21        Systemic VTI:  0.22 m                            Systemic Diam: 2.00 cm Lyman Bishop MD Electronically signed by Lyman Bishop MD Signature Date/Time: 10/10/2020/5:56:08 PM    Final    VAS [place] LOWER EXTREMITY VENOUS (DVT)  Result Date: 10/10/2020  Lower Venous DVT Study Patient Name:  Austin Chang  Date of Exam:   10/09/2020 Medical Rec #:  [id number]      Accession #:    [id number] Date of Birth: 02-20-1965      Patient Gender: M Patient Age:   68 years Exam Location:  [facility] Procedure:      VAS [place] LOWER EXTREMITY VENOUS (DVT) Referring Phys: Bess Harvest ORTIZ --------------------------------------------------------------------------------  Indications: FUO.  Limitations: Poor ultrasound/tissue interface. Comparison Study: No prior studies. Performing Technologist: Oliver Hum RVT  Examination Guidelines: A complete evaluation includes B-mode imaging, spectral Doppler, color Doppler, and power Doppler as needed of all accessible portions of each vessel. Bilateral testing is considered an integral part of a complete examination. Limited examinations for reoccurring indications may be performed as noted. The reflux portion of the exam is performed with the patient in reverse Trendelenburg.  +---------+---------------+---------+-----------+----------+--------------+ RIGHT    CompressibilityPhasicitySpontaneityPropertiesThrombus Aging +---------+---------------+---------+-----------+----------+--------------+ CFV      Full  Yes      Yes                                 +---------+---------------+---------+-----------+----------+--------------+ SFJ      Full                                                        +---------+---------------+---------+-----------+----------+--------------+ FV Prox  Full                                                        +---------+---------------+---------+-----------+----------+--------------+ FV Mid                  Yes      Yes                                 +---------+---------------+---------+-----------+----------+--------------+ FV Distal               Yes      Yes                                 +---------+---------------+---------+-----------+----------+--------------+ PFV      Full                                                         +---------+---------------+---------+-----------+----------+--------------+ POP      Full           Yes      Yes                                 +---------+---------------+---------+-----------+----------+--------------+ PTV      Full                                                        +---------+---------------+---------+-----------+----------+--------------+ PERO     Full                                                        +---------+---------------+---------+-----------+----------+--------------+   +---------+---------------+---------+-----------+----------+--------------+ LEFT     CompressibilityPhasicitySpontaneityPropertiesThrombus Aging +---------+---------------+---------+-----------+----------+--------------+ CFV      Full           Yes      Yes                                 +---------+---------------+---------+-----------+----------+--------------+ SFJ      Full                                                        +---------+---------------+---------+-----------+----------+--------------+  FV Prox  Full                                                        +---------+---------------+---------+-----------+----------+--------------+ FV Mid   Full                                                        +---------+---------------+---------+-----------+----------+--------------+ FV Distal               Yes      Yes                                 +---------+---------------+---------+-----------+----------+--------------+ PFV      Full                                                        +---------+---------------+---------+-----------+----------+--------------+ POP      Full           Yes      Yes                                 +---------+---------------+---------+-----------+----------+--------------+ PTV      Full                                                         +---------+---------------+---------+-----------+----------+--------------+ PERO     Full                                                        +---------+---------------+---------+-----------+----------+--------------+     Summary: RIGHT: - There is no evidence of deep vein thrombosis in the lower extremity. However, portions of this examination were limited- see technologist comments above.  - No cystic structure found in the popliteal fossa.  LEFT: - There is no evidence of deep vein thrombosis in the lower extremity. However, portions of this examination were limited- see technologist comments above.  - No cystic structure found in the popliteal fossa.  *See table(s) above for measurements and observations. Electronically signed by Servando Snare MD on 10/10/2020 at 11:10:16 PM.    Final      Imaging  independently reviewed in Epic.    [facility] [phone] pager 10/11/2020, 8:38 AM  I spent greater than 35 minutes with the patient including greater than 50% of time in face to face counsel of the patient and in coordination of their care.

## 2020-10-11 NOTE — Progress Notes (Signed)
[facility] PROGRESS NOTE    Progress Note  MERL BOMMARITO  GMW:[id number] DOB: Apr 17, 1965 DOA: 10/06/2020 PCP: Josetta Huddle, MD     Brief Narrative:   KURON DOCKEN is an 55 y.o. male past medical history significant for ulcerative colitis on Humira BMI of 38 essential hypertension ankylosing spondylitis presents to the ED for evaluation of fever fatigue loss of appetite that began on 10/02/2020, in the ED he was found to be febrile satting 90 tachycardic and tachypneic, mild hyponatremia elevated procalcitonin CTA negative for PE, positive for pneumonia.  Having fevers and leukocytosis.   Assessment/Plan:   Fever of unknown source: She is currently on IV cefepime and Flagyl he has defervesced. Infectious disease was consulted recommended a CT scan of the abdomen and pelvis showed no acute abnormalities.   2D echo showed a preserved EF aortic valve regurgitation with a mean gradient of 11 mmhg no vegetations Fever unknown source work-up has been negative so far except for CRP significantly elevated. Lower extremity Dopplers and CT angio of the chest were negative for PE. Awaiting further recommendations per ID. Other labs like histoplasma, ACE, ANA, HIV, rheumatoid factor, anti-DNA, cryoglobulins and others are pending will need to follow-up on them.  Hyponatremia: It worsened with IV fluids, will KVO IV fluids recheck basic metabolic panel in the morning. We will go ahead and restrict his fluid intake.  Ulcerative colitis: Resume Humira as an outpatient.  Essential hypertension: Continue to hold antihypertensive medication.   DVT prophylaxis: lovenox Family Communication:none Status is: Inpatient  Remains inpatient appropriate because:Hemodynamically unstable  Dispo: The patient is from: Home              Anticipated d/c is to: Home              Patient currently is not medically stable to d/c.   Difficult to place patient No        Code Status:     Code  Status Orders  (From admission, onward)           Start     Ordered   10/07/20 0008  Full code  Continuous        10/07/20 0010           Code Status History     Date Active Date Inactive Code Status Order ID Comments User Context   01/31/2020 2150 02/02/2020 1921 Full Code [id number]  Doran Heater, DO Inpatient         IV Access:   Peripheral IV   Procedures and diagnostic studies:   CT ABDOMEN PELVIS W CONTRAST  Result Date: 10/10/2020 CLINICAL DATA:  Abdominal pain, fever EXAM: CT ABDOMEN AND PELVIS WITH CONTRAST TECHNIQUE: Multidetector CT imaging of the abdomen and pelvis was performed using the standard protocol following bolus administration of intravenous contrast. CONTRAST:  180m OMNIPAQUE IOHEXOL 350 MG/ML SOLN COMPARISON:  02/20/2010 FINDINGS: Lower chest: Mild bibasilar atelectasis. Cardiac size within normal limits. No pericardial effusion. Hepatobiliary: Mild hepatic steatosis. No enhancing intrahepatic mass. No intra or extrahepatic biliary ductal dilation. Cholelithiasis without pericholecystic inflammatory change noted. Pancreas: Unremarkable Spleen: Unremarkable Adrenals/Urinary Tract: Adrenal glands are unremarkable. Kidneys are normal, without renal calculi, focal lesion, or hydronephrosis. 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 The bladder is otherwise unremarkable. Stomach/Bowel: The stomach, small bowel, and large bowel are unremarkable. Appendix is absent. No free intraperitoneal gas or fluid. Vascular/Lymphatic: Mild aortoiliac atherosclerotic  calcification. No aortic aneurysm. No pathologic adenopathy within the abdomen and pelvis. Reproductive: Prostate is unremarkable. Other: Diastasis of the rectus abdominus musculature noted. No abdominal wall hernia. The rectum is unremarkable. Musculoskeletal: Changes of ankylosing spondylitis are again identified  with ankylosis of the facets of the lumbar spine sacroiliac joints bilaterally as well as flowing syndesmophytes noted throughout the lumbar spine. No acute abnormality. IMPRESSION: No acute intra-abdominal pathology identified. No definite radiographic explanation for the patient's reported symptoms. Cholelithiasis. Mild hepatic steatosis. Tethering of the bladder anteriorly likely related to post inflammatory adhesions. No evidence of bladder outlet obstruction. Chronic changes of ankylosing spondylitis Aortic Atherosclerosis (ICD10-I70.0). Electronically Signed   By: Fidela Salisbury M.D.   On: 10/10/2020 17:16   ECHOCARDIOGRAM COMPLETE  Result Date: 10/10/2020    ECHOCARDIOGRAM REPORT   Patient Name:   NAS WAFER Date of Exam: 10/10/2020 Medical Rec #:  [id number]     Height:       68.0 in Accession #:    [id number]    Weight:       260.5 lb Date of Birth:  23-Jun-1965     BSA:          2.287 m Patient Age:    67 years      BP:           113/69 mmHg Patient Gender: M             HR:           104 bpm. Exam Location:  Inpatient Procedure: 2D Echo, Cardiac Doppler, Color Doppler and Intracardiac            Opacification Agent Indications:    Fever  History:        Patient has no prior history of Echocardiogram examinations.                 Signs/Symptoms:Fever. Covid 19. PNA. Fatigue.  Sonographer:    Merrie Roof RDCS Referring Phys: [place]  1. Left ventricular ejection fraction, by estimation, is 65 to 70%. The left ventricle has normal function. The left ventricle has no regional wall motion abnormalities. Left ventricular diastolic parameters are consistent with Grade I diastolic dysfunction (impaired relaxation).  2. Right ventricular systolic function is normal. The right ventricular size is normal. There is normal pulmonary artery systolic pressure.  3. The mitral valve is grossly normal. Trivial mitral valve regurgitation.  4. The aortic valve was not well visualized. Aortic  valve regurgitation is not visualized. Aortic valve mean gradient measures 11.0 mmHg.  5. The inferior vena cava is normal in size with greater than 50% respiratory variability, suggesting right atrial pressure of 3 mmHg.  6. Increased flow velocities may be secondary to anemia, thyrotoxicosis, hyperdynamic or high flow state. Comparison(s): No prior Echocardiogram. FINDINGS  Left Ventricle: Left ventricular ejection fraction, by estimation, is 65 to 70%. The left ventricle has normal function. The left ventricle has no regional wall motion abnormalities. The left ventricular internal cavity size was normal in size. There is  no left ventricular hypertrophy. Left ventricular diastolic parameters are consistent with Grade I diastolic dysfunction (impaired relaxation). Indeterminate filling pressures. Right Ventricle: The right ventricular size is normal. No increase in right ventricular wall thickness. Right ventricular systolic function is normal. There is normal pulmonary artery systolic pressure. The tricuspid regurgitant velocity is 2.66 m/s, and  with an assumed right atrial pressure of 3 mmHg, the estimated right ventricular systolic pressure is 31.3  mmHg. Left Atrium: Left atrial size was normal in size. Right Atrium: Right atrial size was normal in size. Pericardium: There is no evidence of pericardial effusion. Mitral Valve: The mitral valve is grossly normal. Trivial mitral valve regurgitation. Tricuspid Valve: The tricuspid valve is grossly normal. Tricuspid valve regurgitation is trivial. Aortic Valve: The aortic valve was not well visualized. Aortic valve regurgitation is not visualized. Aortic valve mean gradient measures 11.0 mmHg. Aortic valve peak gradient measures 18.7 mmHg. Aortic valve area, by VTI measures 1.78 cm. Pulmonic Valve: The pulmonic valve was grossly normal. Pulmonic valve regurgitation is trivial. Aorta: The aortic root and ascending aorta are structurally normal, with no evidence of  dilitation. Venous: The inferior vena cava is normal in size with greater than 50% respiratory variability, suggesting right atrial pressure of 3 mmHg. IAS/Shunts: No atrial level shunt detected by color flow Doppler.  LEFT VENTRICLE PLAX 2D LVIDd:         4.70 cm  Diastology LVIDs:         2.90 cm  LV e' medial:    7.29 cm/s LV PW:         1.10 cm  LV E/e' medial:  13.6 LV IVS:        1.00 cm  LV e' lateral:   7.51 cm/s LVOT diam:     2.00 cm  LV E/e' lateral: 13.2 LV SV:         70 LV SV Index:   31 LVOT Area:     3.14 cm  RIGHT VENTRICLE RV Basal diam:  4.00 cm LEFT ATRIUM             Index       RIGHT ATRIUM           Index LA diam:        4.50 cm 1.97 cm/m  RA Area:     14.20 cm LA Vol (A2C):   85.9 ml 37.57 ml/m RA Volume:   31.90 ml  13.95 ml/m LA Vol (A4C):   60.0 ml 26.24 ml/m LA Biplane Vol: 75.8 ml 33.15 ml/m  AORTIC VALVE AV Area (Vmax):    2.17 cm AV Area (Vmean):   1.98 cm AV Area (VTI):     1.78 cm AV Vmax:           216.00 cm/s AV Vmean:          158.000 cm/s AV VTI:            0.393 m AV Peak Grad:      18.7 mmHg AV Mean Grad:      11.0 mmHg LVOT Vmax:         149.00 cm/s LVOT Vmean:        99.800 cm/s LVOT VTI:          0.223 m LVOT/AV VTI ratio: 0.57  AORTA Ao Root diam: 3.10 cm Ao Asc diam:  3.20 cm MITRAL VALVE               TRICUSPID VALVE MV Area (PHT): 4.80 cm    TR Peak grad:   28.3 mmHg MV Decel Time: 158 msec    TR Vmax:        266.00 cm/s MV E velocity: 99.40 cm/s MV A velocity: 82.30 cm/s  SHUNTS MV E/A ratio:  1.21        Systemic VTI:  0.22 m  Systemic Diam: 2.00 cm Lyman Bishop MD Electronically signed by Lyman Bishop MD Signature Date/Time: 10/10/2020/5:56:08 PM    Final    VAS [place] LOWER EXTREMITY VENOUS (DVT)  Result Date: 10/10/2020  Lower Venous DVT Study Patient Name:  SEBASTIN PERLMUTTER  Date of Exam:   10/09/2020 Medical Rec #: [id number]      Accession #:    [id number] Date of Birth: [birth year]      Patient Gender: M Patient Age:   38 years  Exam Location:   [facility] Procedure:      VAS [place] LOWER EXTREMITY VENOUS (DVT) Referring Phys: Bess Harvest ORTIZ --------------------------------------------------------------------------------  Indications: FUO.  Limitations: Poor ultrasound/tissue interface. Comparison Study: No prior studies. Performing Technologist: Oliver Hum RVT  Examination Guidelines: A complete evaluation includes B-mode imaging, spectral Doppler, color Doppler, and power Doppler as needed of all accessible portions of each vessel. Bilateral testing is considered an integral part of a complete examination. Limited examinations for reoccurring indications may be performed as noted. The reflux portion of the exam is performed with the patient in reverse Trendelenburg.  +---------+---------------+---------+-----------+----------+--------------+ RIGHT    CompressibilityPhasicitySpontaneityPropertiesThrombus Aging +---------+---------------+---------+-----------+----------+--------------+ CFV      Full           Yes      Yes                                 +---------+---------------+---------+-----------+----------+--------------+ SFJ      Full                                                        +---------+---------------+---------+-----------+----------+--------------+ FV Prox  Full                                                        +---------+---------------+---------+-----------+----------+--------------+ FV Mid                  Yes      Yes                                 +---------+---------------+---------+-----------+----------+--------------+ FV Distal               Yes      Yes                                 +---------+---------------+---------+-----------+----------+--------------+ PFV      Full                                                        +---------+---------------+---------+-----------+----------+--------------+ POP      Full           Yes      Yes                                  +---------+---------------+---------+-----------+----------+--------------+  PTV      Full                                                        +---------+---------------+---------+-----------+----------+--------------+ PERO     Full                                                        +---------+---------------+---------+-----------+----------+--------------+   +---------+---------------+---------+-----------+----------+--------------+ LEFT     CompressibilityPhasicitySpontaneityPropertiesThrombus Aging +---------+---------------+---------+-----------+----------+--------------+ CFV      Full           Yes      Yes                                 +---------+---------------+---------+-----------+----------+--------------+ SFJ      Full                                                        +---------+---------------+---------+-----------+----------+--------------+ FV Prox  Full                                                        +---------+---------------+---------+-----------+----------+--------------+ FV Mid   Full                                                        +---------+---------------+---------+-----------+----------+--------------+ FV Distal               Yes      Yes                                 +---------+---------------+---------+-----------+----------+--------------+ PFV      Full                                                        +---------+---------------+---------+-----------+----------+--------------+ POP      Full           Yes      Yes                                 +---------+---------------+---------+-----------+----------+--------------+ PTV      Full                                                        +---------+---------------+---------+-----------+----------+--------------+  PERO     Full                                                         +---------+---------------+---------+-----------+----------+--------------+     Summary: RIGHT: - There is no evidence of deep vein thrombosis in the lower extremity. However, portions of this examination were limited- see technologist comments above.  - No cystic structure found in the popliteal fossa.  LEFT: - There is no evidence of deep vein thrombosis in the lower extremity. However, portions of this examination were limited- see technologist comments above.  - No cystic structure found in the popliteal fossa.  *See table(s) above for measurements and observations. Electronically signed by Servando Snare MD on 10/10/2020 at 11:10:16 PM.    Final      Medical Consultants:   None.   Subjective:     SPENCE SOBERANO no further fevers today  Objective:    Vitals:   10/10/20 2100 10/11/20 0647 10/11/20 0800 10/11/20 1000  BP: 129/70 130/73 140/73 135/70  Pulse: (!) 109 (!) 102 (!) 112   Resp: 20 20 (!) 40   Temp: 99.7 F (37.6 C) 99.1 F (37.3 C) 99.9 F (37.7 C)   TempSrc: Oral Oral Oral   SpO2: 99% 96% 93%   Weight:      Height:       SpO2: 93 % O2 Flow Rate (L/min): 2 L/min   Intake/Output Summary (Last 24 hours) at 10/11/2020 1031 Last data filed at 10/11/2020 0800 Gross per 24 hour  Intake --  Output 600 ml  Net -600 ml    Filed Weights   10/08/20 0400 10/09/20 0509 10/10/20 0604  Weight: 117.1 kg 117.4 kg 118.2 kg    Exam: General exam: In no acute distress. Respiratory system: Good air movement and clear to auscultation. Cardiovascular system: S1 & S2 heard, RRR. No JVD. Gastrointestinal system: Abdomen is nondistended, soft and nontender.  Extremities: No pedal edema. Skin: No rashes, lesions or ulcers   Data Reviewed:    Labs: Basic Metabolic Panel: Recent Labs  Lab 10/06/20 1408 10/07/20 0252 10/08/20 0245 10/09/20 0104 10/11/20 0254  NA 123* 125* 127* 123* 118*  K 4.0 3.6 4.1 4.0 4.0  CL 88* 89* 92* 92* 88*  CO2 27 25 26 22 22   GLUCOSE 153*  135* 129* 104* 118*  BUN 10 11 14 19 16   CREATININE 1.08 1.07 1.13 1.14 0.93  CALCIUM 8.6* 8.6* 9.1 8.6* 8.2*  MG  --  1.7  --   --   --   PHOS  --  3.4  --   --   --     GFR Estimated Creatinine Clearance: 109.5 mL/min (by C-G formula based on SCr of 0.93 mg/dL). Liver Function Tests: Recent Labs  Lab 10/06/20 1408  AST 39  ALT 30  ALKPHOS 111  BILITOT 1.1  PROT 7.3  ALBUMIN 3.3*    No results for input(s): LIPASE, AMYLASE in the last 168 hours. No results for input(s): AMMONIA in the last 168 hours. Coagulation profile No results for input(s): INR, PROTIME in the last 168 hours. COVID-19 Labs  Recent Labs    10/09/20 1424  FERRITIN 979*  LDH 198*  CRP 32.2*     Lab Results  Component Value Date   SARSCOV2NAA NEGATIVE  10/06/2020   SARSCOV2NAA NEGATIVE 10/06/2020   SARSCOV2NAA POSITIVE (A) 01/30/2020    CBC: Recent Labs  Lab 10/06/20 1408 10/07/20 0252 10/08/20 0245 10/08/20 0313 10/09/20 0104 10/09/20 0954  WBC 8.3 9.6 THIS TEST WAS ORDERED IN ERROR AND HAS BEEN CREDITED. 11.8* 12.2* 12.4*  NEUTROABS 6.7  --   --  9.2*  --  10.7*  HGB 14.1 13.0 THIS TEST WAS ORDERED IN ERROR AND HAS BEEN CREDITED. 13.6 12.5* 12.2*  HCT 38.1* 36.2* THIS TEST WAS ORDERED IN ERROR AND HAS BEEN CREDITED. 38.5* 34.8* 35.6*  MCV 90.9 92.6 THIS TEST WAS ORDERED IN ERROR AND HAS BEEN CREDITED. 94.1 93.5 94.7  PLT 153 160 THIS TEST WAS ORDERED IN ERROR AND HAS BEEN CREDITED. 174 196 220    Cardiac Enzymes: Recent Labs  Lab 10/09/20 1424  CKTOTAL 55    BNP (last 3 results) No results for input(s): PROBNP in the last 8760 hours. CBG: No results for input(s): GLUCAP in the last 168 hours. D-Dimer: No results for input(s): DDIMER in the last 72 hours.  Hgb A1c: No results for input(s): HGBA1C in the last 72 hours. Lipid Profile: No results for input(s): CHOL, HDL, LDLCALC, TRIG, CHOLHDL, LDLDIRECT in the last 72 hours. Thyroid function studies: No results for  input(s): TSH, T4TOTAL, T3FREE, THYROIDAB in the last 72 hours.  Invalid input(s): FREET3 Anemia work up: Recent Labs    10/09/20 1424  FERRITIN 979*    Sepsis Labs: Recent Labs  Lab 10/06/20 1820 10/06/20 1922 10/07/20 0252 10/08/20 0245 10/08/20 0313 10/09/20 0104 10/09/20 0954  PROCALCITON 1.96  --   --  10.81  --   --   --   WBC  --   --    < > THIS TEST WAS ORDERED IN ERROR AND HAS BEEN CREDITED. 11.8* 12.2* 12.4*  LATICACIDVEN  --  1.4  --   --   --   --   --    < > = values in this interval not displayed.    Microbiology Recent Results (from the past 240 hour(s))  Resp Panel by RT-PCR (Flu A&B, Covid) Nasopharyngeal Swab     Status: None   Collection Time: 10/06/20  2:08 PM   Specimen: Nasopharyngeal Swab; Nasopharyngeal(NP) swabs in vial transport medium  Result Value Ref Range Status   SARS Coronavirus 2 by RT PCR NEGATIVE NEGATIVE Final    Comment: (NOTE) SARS-CoV-2 target nucleic acids are NOT DETECTED.  The SARS-CoV-2 RNA is generally detectable in upper respiratory specimens during the acute phase of infection. The lowest concentration of SARS-CoV-2 viral copies this assay can detect is 138 copies/mL. A negative result does not preclude SARS-Cov-2 infection and should not be used as the sole basis for treatment or other patient management decisions. A negative result may occur with  improper specimen collection/handling, submission of specimen other than nasopharyngeal swab, presence of viral mutation(s) within the areas targeted by this assay, and inadequate number of viral copies(<138 copies/mL). A negative result must be combined with clinical observations, patient history, and epidemiological information. The expected result is Negative.  Fact Sheet for Patients:  EntrepreneurPulse.com.au  Fact Sheet for Healthcare Providers:  IncredibleEmployment.be  This test is no t yet approved or cleared by the [place] FDA and  has been authorized for detection and/or diagnosis of SARS-CoV-2 by FDA under an Emergency Use Authorization (EUA). This EUA will remain  in effect (meaning this test can be used) for the duration of the COVID-19  declaration under Section 564(b)(1) of the Act, 21 U.S.C.section 360bbb-3(b)(1), unless the authorization is terminated  or revoked sooner.       Influenza A by PCR NEGATIVE NEGATIVE Final   Influenza B by PCR NEGATIVE NEGATIVE Final    Comment: (NOTE) The Xpert Xpress SARS-CoV-2/FLU/RSV plus assay is intended as an aid in the diagnosis of influenza from Nasopharyngeal swab specimens and should not be used as a sole basis for treatment. Nasal washings and aspirates are unacceptable for Xpert Xpress SARS-CoV-2/FLU/RSV testing.  Fact Sheet for Patients: EntrepreneurPulse.com.au  Fact Sheet for Healthcare Providers: IncredibleEmployment.be  This test is not yet approved or cleared by the [place] FDA and has been authorized for detection and/or diagnosis of SARS-CoV-2 by FDA under an Emergency Use Authorization (EUA). This EUA will remain in effect (meaning this test can be used) for the duration of the COVID-19 declaration under Section 564(b)(1) of the Act, 21 U.S.C. section 360bbb-3(b)(1), unless the authorization is terminated or revoked.  Performed at [facility], [address] [place], [place] [address]   Blood culture (routine x 2)     Status: None   Collection Time: 10/06/20  7:10 PM   Specimen: Right Antecubital; Blood  Result Value Ref Range Status   Specimen Description   Final    RIGHT ANTECUBITAL Performed at [facility], [address] [place], [place] [address]    Special Requests   Final    BOTTLES DRAWN AEROBIC AND ANAEROBIC Blood Culture results may not be optimal due to an inadequate volume of blood received in culture bottles Performed at [facility],  [address] [place], [place] [address]    Culture   Final    NO GROWTH 5 DAYS Performed at [facility], [place] [address] [place], [place] [address]    Report Status 10/11/2020 FINAL  Final  Blood culture (routine x 2)     Status: None   Collection Time: 10/06/20  7:24 PM   Specimen: BLOOD RIGHT WRIST  Result Value Ref Range Status   Specimen Description   Final    BLOOD RIGHT WRIST Performed at [facility], [address] [place], [place] [address]    Special Requests   Final    BOTTLES DRAWN AEROBIC AND ANAEROBIC Blood Culture adequate volume Performed at [facility], [address] [place], [place] [address]    Culture   Final    NO GROWTH 5 DAYS Performed at [facility], [place] [address], [place], [place] [address]    Report Status 10/11/2020 FINAL  Final  Respiratory (~20 pathogens) panel by PCR     Status: None   Collection Time: 10/06/20  7:51 PM   Specimen: Nasopharyngeal Swab; Respiratory  Result Value Ref Range Status   Adenovirus NOT DETECTED NOT DETECTED Final   Coronavirus 229E NOT DETECTED NOT DETECTED Final    Comment: (NOTE) The Coronavirus on the Respiratory Panel, DOES NOT test for the novel  Coronavirus (2019 nCoV)    Coronavirus HKU1 NOT DETECTED NOT DETECTED Final   Coronavirus NL63 NOT DETECTED NOT DETECTED Final   Coronavirus OC43 NOT DETECTED NOT DETECTED Final   Metapneumovirus NOT DETECTED NOT DETECTED Final   Rhinovirus / Enterovirus NOT DETECTED NOT DETECTED Final   Influenza A NOT DETECTED NOT DETECTED Final   Influenza B NOT DETECTED NOT DETECTED Final   Parainfluenza Virus 1 NOT DETECTED NOT  DETECTED Final   Parainfluenza Virus 2 NOT DETECTED NOT DETECTED Final   Parainfluenza Virus 3 NOT DETECTED NOT DETECTED Final   Parainfluenza Virus 4 NOT DETECTED NOT DETECTED Final   Respiratory Syncytial Virus NOT DETECTED NOT DETECTED Final   Bordetella pertussis NOT DETECTED NOT DETECTED Final   Bordetella  Parapertussis NOT DETECTED NOT DETECTED Final   Chlamydophila pneumoniae NOT DETECTED NOT DETECTED Final   Mycoplasma pneumoniae NOT DETECTED NOT DETECTED Final    Comment: Performed at [facility], [place] [address] [place], [place] [address]  Resp Panel by RT-PCR (Flu A&B, Covid) Nasopharyngeal Swab     Status: None   Collection Time: 10/06/20  7:51 PM   Specimen: Nasopharyngeal Swab; Nasopharyngeal(NP) swabs in vial transport medium  Result Value Ref Range Status   SARS Coronavirus 2 by RT PCR NEGATIVE NEGATIVE Final    Comment: (NOTE) SARS-CoV-2 target nucleic acids are NOT DETECTED.  The SARS-CoV-2 RNA is generally detectable in upper respiratory specimens during the acute phase of infection. The lowest concentration of SARS-CoV-2 viral copies this assay can detect is 138 copies/mL. A negative result does not preclude SARS-Cov-2 infection and should not be used as the sole basis for treatment or other patient management decisions. A negative result may occur with  improper specimen collection/handling, submission of specimen other than nasopharyngeal swab, presence of viral mutation(s) within the areas targeted by this assay, and inadequate number of viral copies(<138 copies/mL). A negative result must be combined with clinical observations, patient history, and epidemiological information. The expected result is Negative.  Fact Sheet for Patients:  EntrepreneurPulse.com.au  Fact Sheet for Healthcare Providers:  IncredibleEmployment.be  This test is no t yet approved or cleared by the [place] FDA and  has been authorized for detection and/or diagnosis of SARS-CoV-2 by FDA under an Emergency Use Authorization (EUA). This EUA will remain  in effect (meaning this test can be used) for the duration of the COVID-19 declaration under Section 564(b)(1) of the Act, 21 U.S.C.section 360bbb-3(b)(1), unless the authorization is terminated   or revoked sooner.       Influenza A by PCR NEGATIVE NEGATIVE Final   Influenza B by PCR NEGATIVE NEGATIVE Final    Comment: (NOTE) The Xpert Xpress SARS-CoV-2/FLU/RSV plus assay is intended as an aid in the diagnosis of influenza from Nasopharyngeal swab specimens and should not be used as a sole basis for treatment. Nasal washings and aspirates are unacceptable for Xpert Xpress SARS-CoV-2/FLU/RSV testing.  Fact Sheet for Patients: EntrepreneurPulse.com.au  Fact Sheet for Healthcare Providers: IncredibleEmployment.be  This test is not yet approved or cleared by the [place] FDA and has been authorized for detection and/or diagnosis of SARS-CoV-2 by FDA under an Emergency Use Authorization (EUA). This EUA will remain in effect (meaning this test can be used) for the duration of the COVID-19 declaration under Section 564(b)(1) of the Act, 21 U.S.C. section 360bbb-3(b)(1), unless the authorization is terminated or revoked.  Performed at [facility], [address] [place], [place] [address]   MRSA Next Gen by PCR, Nasal     Status: None   Collection Time: 10/07/20 11:53 AM   Specimen: Nasal Mucosa; Nasal Swab  Result Value Ref Range Status   MRSA by PCR Next Gen NOT DETECTED NOT DETECTED Final    Comment: (NOTE) The GeneXpert MRSA Assay (FDA approved for NASAL specimens only), is one component of a comprehensive MRSA colonization surveillance program. It is not intended to diagnose MRSA  infection nor to guide or monitor treatment for MRSA infections. Test performance is not FDA approved in patients less than 29 years old. Performed at [facility], [place] [address], [place], [place] [address]      Medications:    allopurinol  300 mg Oral Daily   amLODipine  10 mg Oral Daily   enoxaparin (LOVENOX) injection  40 mg Subcutaneous Daily   nystatin  5 mL Oral QID   Continuous Infusions:  sodium chloride 10 mL/hr  at 10/09/20 0636   ceFEPime (MAXIPIME) IV 2 g (10/11/20 4917)      LOS: 5 days   Charlynne Cousins  [facility]  10/11/2020, 10:31 AM

## 2020-10-11 NOTE — Progress Notes (Signed)
Pharmacy Antibiotic Note  Austin Chang is a 55 y.o. male admitted on 10/06/2020 with fevers .  Pharmacy consulted for Cefepime (day 5) . ID following and may discontinue antibiotics on 9/14 -WBC= 12.4, afebrile, CrCl ~ 100  Plan: Continue Cefepime 2g IV q8h Monitor clinical status, renal function, CBC    Height: 5' 8"  (172.7 cm) Weight: 118.2 kg (260 lb 8 oz) IBW/kg (Calculated) : 68.4  Temp (24hrs), Avg:99.3 F (37.4 C), Min:98.5 F (36.9 C), Max:99.9 F (37.7 C)  Recent Labs  Lab 10/06/20 1408 10/06/20 1922 10/07/20 0252 10/08/20 0245 10/08/20 0313 10/09/20 0104 10/09/20 0954 10/11/20 0254  WBC 8.3  --  9.6 THIS TEST WAS ORDERED IN ERROR AND HAS BEEN CREDITED. 11.8* 12.2* 12.4*  --   CREATININE 1.08  --  1.07 1.13  --  1.14  --  0.93  LATICACIDVEN  --  1.4  --   --   --   --   --   --      Estimated Creatinine Clearance: 109.5 mL/min (by C-G formula based on SCr of 0.93 mg/dL).    Allergies  Allergen Reactions   Septra [Bactrim] Other (See Comments)    Very bad migraine   Sulfamethoxazole-Trimethoprim     Other reaction(s): massive headache    Antimicrobials this admission: Doxycycline 9/10 >>  Cefepime 9/9 >> Vancomycin 9/9 >> 9/10 Flagyl 9/9 >> 9/10 Ceftriaxone 9/8 x 1  Dose adjustments this admission:   Microbiology results: 9/8 BCx: neg 9/9 MRSA PCR: not detected 9/9 Resp panel PCR: not detected    Thank you for allowing pharmacy to be a part of this patient's care.  Hildred Laser, PharmD Clinical Pharmacist **Pharmacist phone directory can now be found on [email or web address] ([email or web address]).  Listed under [place].

## 2020-10-12 ENCOUNTER — Inpatient Hospital Stay (HOSPITAL_COMMUNITY): Payer: BC Managed Care – PPO

## 2020-10-12 DIAGNOSIS — E871 Hypo-osmolality and hyponatremia: Secondary | ICD-10-CM | POA: Diagnosis not present

## 2020-10-12 DIAGNOSIS — R509 Fever, unspecified: Secondary | ICD-10-CM | POA: Diagnosis not present

## 2020-10-12 DIAGNOSIS — I10 Essential (primary) hypertension: Secondary | ICD-10-CM | POA: Diagnosis not present

## 2020-10-12 DIAGNOSIS — K529 Noninfective gastroenteritis and colitis, unspecified: Secondary | ICD-10-CM | POA: Diagnosis not present

## 2020-10-12 LAB — CBC WITH DIFFERENTIAL/PLATELET
Abs Immature Granulocytes: 0.14 10*3/uL — ABNORMAL HIGH (ref 0.00–0.07)
Basophils Absolute: 0 10*3/uL (ref 0.0–0.1)
Basophils Relative: 0 %
Eosinophils Absolute: 0.1 10*3/uL (ref 0.0–0.5)
Eosinophils Relative: 0 %
HCT: 32.7 % — ABNORMAL LOW (ref 39.0–52.0)
Hemoglobin: 11.5 g/dL — ABNORMAL LOW (ref 13.0–17.0)
Immature Granulocytes: 1 %
Lymphocytes Relative: 4 %
Lymphs Abs: 0.6 10*3/uL — ABNORMAL LOW (ref 0.7–4.0)
MCH: 32.9 pg (ref 26.0–34.0)
MCHC: 35.2 g/dL (ref 30.0–36.0)
MCV: 93.4 fL (ref 80.0–100.0)
Monocytes Absolute: 0.7 10*3/uL (ref 0.1–1.0)
Monocytes Relative: 5 %
Neutro Abs: 12.4 10*3/uL — ABNORMAL HIGH (ref 1.7–7.7)
Neutrophils Relative %: 90 %
Platelets: 297 10*3/uL (ref 150–400)
RBC: 3.5 MIL/uL — ABNORMAL LOW (ref 4.22–5.81)
RDW: 12.6 % (ref 11.5–15.5)
WBC: 13.9 10*3/uL — ABNORMAL HIGH (ref 4.0–10.5)
nRBC: 0 % (ref 0.0–0.2)

## 2020-10-12 LAB — HEPATIC FUNCTION PANEL
ALT: 27 U/L (ref 0–44)
AST: 43 U/L — ABNORMAL HIGH (ref 15–41)
Albumin: 2.1 g/dL — ABNORMAL LOW (ref 3.5–5.0)
Alkaline Phosphatase: 204 U/L — ABNORMAL HIGH (ref 38–126)
Bilirubin, Direct: 0.6 mg/dL — ABNORMAL HIGH (ref 0.0–0.2)
Indirect Bilirubin: 0.7 mg/dL (ref 0.3–0.9)
Total Bilirubin: 1.3 mg/dL — ABNORMAL HIGH (ref 0.3–1.2)
Total Protein: 5.4 g/dL — ABNORMAL LOW (ref 6.5–8.1)

## 2020-10-12 LAB — FIBRINOGEN: Fibrinogen: 732 mg/dL — ABNORMAL HIGH (ref 210–475)

## 2020-10-12 LAB — BASIC METABOLIC PANEL
Anion gap: 9 (ref 5–15)
BUN: 16 mg/dL (ref 6–20)
CO2: 23 mmol/L (ref 22–32)
Calcium: 8 mg/dL — ABNORMAL LOW (ref 8.9–10.3)
Chloride: 90 mmol/L — ABNORMAL LOW (ref 98–111)
Creatinine, Ser: 0.99 mg/dL (ref 0.61–1.24)
GFR, Estimated: >60 mL/min (ref >60)
Glucose, Bld: 90 mg/dL (ref 70–99)
Potassium: 4.5 mmol/L (ref 3.5–5.1)
Sodium: 122 mmol/L — ABNORMAL LOW (ref 135–145)

## 2020-10-12 LAB — PROTEIN ELECTROPHORESIS, SERUM
A/G Ratio: 0.8 (ref 0.7–1.7)
Albumin ELP: 2.4 g/dL — ABNORMAL LOW (ref 2.9–4.4)
Alpha-1-Globulin: 0.4 g/dL (ref 0.0–0.4)
Alpha-2-Globulin: 0.9 g/dL (ref 0.4–1.0)
Beta Globulin: 0.7 g/dL (ref 0.7–1.3)
Gamma Globulin: 0.9 g/dL (ref 0.4–1.8)
Globulin, Total: 3 g/dL (ref 2.2–3.9)
M-Spike, %: 0.2 g/dL — ABNORMAL HIGH
Total Protein ELP: 5.4 g/dL — ABNORMAL LOW (ref 6.0–8.5)

## 2020-10-12 LAB — FERRITIN: Ferritin: 1403 ng/mL — ABNORMAL HIGH (ref 24–336)

## 2020-10-12 LAB — TRIGLYCERIDES: Triglycerides: 171 mg/dL — ABNORMAL HIGH (ref <150)

## 2020-10-12 LAB — C-REACTIVE PROTEIN: CRP: 23 mg/dL — ABNORMAL HIGH (ref <1.0)

## 2020-10-12 LAB — PROCALCITONIN: Procalcitonin: 6.61 ng/mL

## 2020-10-12 LAB — SEDIMENTATION RATE: Sed Rate: 85 mm/hr — ABNORMAL HIGH (ref 0–16)

## 2020-10-12 MED ORDER — FUROSEMIDE 10 MG/ML IJ SOLN
40.0000 mg | Freq: Every day | INTRAMUSCULAR | Status: DC
  Administered 2020-10-13 – 2020-10-14 (×2): 40 mg via INTRAVENOUS
  Filled 2020-10-12 (×2): qty 4

## 2020-10-12 MED ORDER — LORAZEPAM 2 MG/ML IJ SOLN
0.2500 mg | Freq: Once | INTRAMUSCULAR | Status: AC
  Administered 2020-10-12: 0.25 mg via INTRAVENOUS
  Filled 2020-10-12: qty 1

## 2020-10-12 MED ORDER — FUROSEMIDE 10 MG/ML IJ SOLN
40.0000 mg | Freq: Once | INTRAMUSCULAR | Status: AC
  Administered 2020-10-12: 40 mg via INTRAVENOUS
  Filled 2020-10-12: qty 4

## 2020-10-12 MED ORDER — OXYCODONE HCL 5 MG PO TABS
5.0000 mg | ORAL_TABLET | Freq: Three times a day (TID) | ORAL | Status: DC | PRN
Start: 2020-10-12 — End: 2020-10-17
  Administered 2020-10-12 – 2020-10-17 (×9): 5 mg via ORAL
  Filled 2020-10-12 (×9): qty 1

## 2020-10-12 NOTE — Progress Notes (Signed)
Physical Therapy Treatment Patient Details Name: Austin Chang MRN: [id number] DOB: 10/08/1965 Today's Date: 10/12/2020   History of Present Illness Austin Chang is an 55 y.o. male past medical history significant for ulcerative colitis on Humira BMI of 38 essential hypertension ankylosing spondylitis presents to the ED for evaluation of fever fatigue loss of appetite that began on 10/02/2020, in the ED he was found to be febrile satting 90 tachycardic and tachypneic, mild hyponatremia elevated procalcitonin CTA negative for PE.  Admitted for sepsis due to CAP.    PT Comments    Pt with lower extremity edema noted and pt requesting to not stand or ambulate much today due to the edema having worsened and causing him pain. Pt was sitting with legs in a dependent position upon arrival, thus educated on managing the edema through elevating the legs and performing ankle pumps. Focused session on exercises and reducing edema with retrograde massage. HR remained 100-110s today. Will continue to follow acutely. Current recommendations remain appropriate.    Recommendations for follow up therapy are one component of a multi-disciplinary discharge planning process, led by the attending physician.  Recommendations may be updated based on patient status, additional functional criteria and insurance authorization.  Follow Up Recommendations  No PT follow up     Equipment Recommendations  None recommended by PT    Recommendations for Other Services       Precautions / Restrictions Precautions Precautions: Fall Precaution Comments: watch HR, inc to 205bpm during amb Restrictions Weight Bearing Restrictions: No     Mobility  Bed Mobility Overal bed mobility: Modified Independent             General bed mobility comments: Pt able to transition sit to supine with HOB elevated without assistance.    Transfers Overall transfer level: Needs assistance Equipment used: None Transfers: Sit  to/from [facility] Sit to Stand: Supervision Stand pivot transfers: Supervision       General transfer comment: Supervision for safety, no LOB.  Ambulation/Gait Ambulation/Gait assistance: Supervision Gait Distance (Feet): 4 Feet   Gait Pattern/deviations: Step-through pattern;Decreased stride length Gait velocity: dec Gait velocity interpretation: <1.31 ft/sec, indicative of household ambulator General Gait Details: Pt with short lateral steps to transfer recliner to bed with supervision for safety, no LOB.   Stairs             Wheelchair Mobility    Modified Rankin (Stroke Patients Only)       Balance Overall balance assessment: Needs assistance   Sitting balance-Leahy Scale: Good     Standing balance support: No upper extremity supported Standing balance-Leahy Scale: Fair Standing balance comment: Able to take several short steps without UE support.                            Cognition Arousal/Alertness: Awake/alert Behavior During Therapy: WFL for tasks assessed/performed Overall Cognitive Status: Within Functional Limits for tasks assessed                                        Exercises General Exercises - Lower Extremity Ankle Circles/Pumps: AROM;Both;15 reps;Supine Long Arc Quad: AROM;Both;15 reps;Seated Hip ABduction/ADduction: AROM;Both;10 reps;Seated Hip Flexion/Marching: AROM;Both;10 reps;Seated Other Exercises Other Exercises: Gentle retrograde edema massage applied to bil lower extremities    General Comments General comments (skin integrity, edema, etc.): Pt requested not to  perform much standing and to defer ambulation this date due to edema and pain in legs; educated pt on elevating legs and performing ankle pumps to manage edema      Pertinent Vitals/Pain Pain Assessment: Faces Faces Pain Scale: Hurts little more Pain Location: legs with edema Pain Descriptors / Indicators:  Discomfort Pain Intervention(s): Limited activity within patient's tolerance;Monitored during session;Repositioned    Home Living                      Prior Function            PT Goals (current goals can now be found in the care plan section) Acute Rehab PT Goals Patient Stated Goal: to reduce his leg edema PT Goal Formulation: With patient Time For Goal Achievement: 10/21/20 Potential to Achieve Goals: Good Progress towards PT goals: Not progressing toward goals - comment (due to edema limiting mobility)    Frequency    Min 3X/week      PT Plan Current plan remains appropriate    Co-evaluation              AM-PAC PT "6 Clicks" Mobility   Outcome Measure  Help needed turning from your back to your side while in a flat bed without using bedrails?: None Help needed moving from lying on your back to sitting on the side of a flat bed without using bedrails?: None Help needed moving to and from a bed to a chair (including a wheelchair)?: A Little Help needed standing up from a chair using your arms (e.g., wheelchair or bedside chair)?: A Little Help needed to walk in hospital room?: A Little Help needed climbing 3-5 steps with a railing? : A Little 6 Click Score: 20    End of Session   Activity Tolerance: Patient limited by pain (edema in legs) Patient left: with call bell/phone within reach;in bed;with bed alarm set Nurse Communication: Mobility status PT Visit Diagnosis: Other abnormalities of gait and mobility (R26.89);Muscle weakness (generalized) (M62.81);Unsteadiness on feet (R26.81);Difficulty in walking, not elsewhere classified (R26.2)     Time: 4270-6237 PT Time Calculation (min) (ACUTE ONLY): 22 min  Charges:  $Therapeutic Exercise: 8-22 mins                     Moishe Spice, PT, DPT Acute Rehabilitation Services  Pager: [phone] Office: [place] 10/12/2020, 6:33 PM

## 2020-10-12 NOTE — Progress Notes (Signed)
[facility] for Infectious Disease  Date of Admission:  10/06/2020           Reason for visit: Follow up on fevers  Current antibiotics: Cefepime 9/8-present    Previous antibiotics: Azithromycin x1 dose 9/8  Ceftriaxone x1 dose 9/8 Doxycycline 9/10-9/11 Vancomycin 9/8-9/9 Metronidazole 9/8-9/10; 9/12-9/13  ASSESSMENT:    55 y.o. male admitted with:  Fevers of unclear etiology.  Extensive work-up thus far has been unremarkable with negative CTA of the chest and CT abdomen/pelvis.  TTE did not show any significant abnormalities.  Lower extremity Dopplers were negative for DVT.  Blood cultures negative x2, COVID PCR negative on admission, respiratory viral panel negative.  Other negative infectious serologies include CMV, hepatitis panel, RPR, HIV.  Negative autoimmune labs include antidouble-stranded DNA, rheumatoid factor, CK, ACE level.  Pending labs include cryoglobulin and protein electrophoresis and ANA.  Urine histoplasma antigen also pending.  Of note, procalcitonin was significantly elevated along with sed rate, CRP, and ferritin. 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  RECOMMENDATIONS:    Continue cefepime Repeat inflammatory markers and procalcitonin to evaluate trend (ordered) Recommend MRI of the lumbar spine given continued fevers and low back pain Follow-up urine histo antigen Follow-up repeat blood cultures Consider other non-infectious etiology of fevers Will follow   Principal Problem:   Sepsis ([place]) Active Problems:   Essential hypertension   Inflammatory bowel disease   Hyponatremia   Bilateral pneumonia    MEDICATIONS:    Scheduled  Meds:  allopurinol  300 mg Oral Daily   amLODipine  10 mg Oral Daily   enoxaparin (LOVENOX) injection  40 mg Subcutaneous Daily   nystatin  5 mL Oral QID   Continuous Infusions:  sodium chloride 10 mL/hr at 10/09/20 0636   ceFEPime (MAXIPIME) IV 2 g (10/12/20 0642)   PRN Meds:.sodium chloride, acetaminophen, alum & mag hydroxide-simeth, ondansetron **OR** ondansetron (ZOFRAN) IV, zolpidem  SUBJECTIVE:   24 hour events:  Febrile overnight T-max 101.9 Continues on cefepime Was given IV Lasix yesterday Frustrated by his prolonged hospital stay without definitive explanation of fevers Complains of back pain Complains of leg swelling No neck pain No other localizing symptoms other than fevers   Review of Systems  All other systems reviewed and are negative.    OBJECTIVE:   Blood pressure 112/67, pulse (!) 116, temperature (!) 100.4 F (38 C), temperature source Oral, resp. rate (!) 30, height 5' 8"  (1.727 m), weight 119 kg, SpO2 92 %. Body mass index is 39.89 kg/m.  Physical Exam Constitutional:      General: He is not in acute distress.    Appearance: Normal appearance.  HENT:     Head: Normocephalic and atraumatic.  Eyes:     Extraocular Movements: Extraocular movements intact.     Conjunctiva/sclera: Conjunctivae normal.  Pulmonary:     Effort: Pulmonary effort is normal. No respiratory distress.  Abdominal:     General: There is no distension.     Tenderness: There is no abdominal tenderness. There is no guarding or rebound.  Musculoskeletal:     Cervical back: Normal range of motion and neck supple.     Right lower leg: Edema present.     Left lower leg:  Edema present.  Skin:    General: Skin is warm and dry.     Findings: No rash.  Neurological:     General: No focal deficit present.     Mental Status: He is alert and oriented to person, place, and time. Mental status is at baseline.  Psychiatric:        Mood and Affect: Mood normal.        Behavior:  Behavior normal.     Lab Results: Lab Results  Component Value Date   WBC 12.4 (H) 10/09/2020   HGB 12.2 (L) 10/09/2020   HCT 35.6 (L) 10/09/2020   MCV 94.7 10/09/2020   PLT 220 10/09/2020    Lab Results  Component Value Date   NA 118 (LL) 10/11/2020   K 4.0 10/11/2020   CO2 22 10/11/2020   GLUCOSE 118 (H) 10/11/2020   BUN 16 10/11/2020   CREATININE 0.93 10/11/2020   CALCIUM 8.2 (L) 10/11/2020   GFRNONAA >60 10/11/2020   GFRAA >60 08/18/2010    Lab Results  Component Value Date   ALT 30 10/06/2020   AST 39 10/06/2020   ALKPHOS 111 10/06/2020   BILITOT 1.1 10/06/2020       Component Value Date/Time   CRP 32.2 (H) 10/09/2020 1424       Component Value Date/Time   ESRSEDRATE 80 (H) 10/09/2020 1424     I have reviewed the micro and lab results in Epic.  Imaging: CT ABDOMEN PELVIS W CONTRAST  Result Date: 10/10/2020 CLINICAL DATA:  Abdominal pain, fever EXAM: CT ABDOMEN AND PELVIS WITH CONTRAST TECHNIQUE: Multidetector CT imaging of the abdomen and pelvis was performed using the standard protocol following bolus administration of intravenous contrast. CONTRAST:  128m OMNIPAQUE IOHEXOL 350 MG/ML SOLN COMPARISON:  02/20/2010 FINDINGS: Lower chest: Mild bibasilar atelectasis. Cardiac size within normal limits. No pericardial effusion. Hepatobiliary: Mild hepatic steatosis. No enhancing intrahepatic mass. No intra or extrahepatic biliary ductal dilation. Cholelithiasis without pericholecystic inflammatory change noted. Pancreas: Unremarkable Spleen: Unremarkable Adrenals/Urinary Tract: Adrenal glands are unremarkable. Kidneys are normal, without renal calculi, focal lesion, or hydronephrosis. 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 The bladder is otherwise unremarkable. Stomach/Bowel: The stomach, small bowel, and large bowel are unremarkable. Appendix is absent. No free  intraperitoneal gas or fluid. Vascular/Lymphatic: Mild aortoiliac atherosclerotic calcification. No aortic aneurysm. No pathologic adenopathy within the abdomen and pelvis. Reproductive: Prostate is unremarkable. Other: Diastasis of the rectus abdominus musculature noted. No abdominal wall hernia. The rectum is unremarkable. Musculoskeletal: Changes of ankylosing spondylitis are again identified with ankylosis of the facets of the lumbar spine sacroiliac joints bilaterally as well as flowing syndesmophytes noted throughout the lumbar spine. No acute abnormality. IMPRESSION: No acute intra-abdominal pathology identified. No definite radiographic explanation for the patient's reported symptoms. Cholelithiasis. Mild hepatic steatosis. Tethering of the bladder anteriorly likely related to post inflammatory adhesions. No evidence of bladder outlet obstruction. Chronic changes of ankylosing spondylitis Aortic Atherosclerosis (ICD10-I70.0). Electronically Signed   By: AFidela SalisburyM.D.   On: 10/10/2020 17:16   ECHOCARDIOGRAM COMPLETE  Result Date: 10/10/2020    ECHOCARDIOGRAM REPORT   Patient Name:   RHAKIM MINNIEFIELDDate of Exam: 10/10/2020 Medical Rec #:  [id number]    Height:       68.0 in Accession #:    [id number]   Weight:       260.5 lb Date of Birth:  612/19/1967  BSA:          2.287 m Patient Age:    40 years      BP:           113/69 mmHg Patient Gender: M             HR:           104 bpm. Exam Location:  Inpatient Procedure: 2D Echo, Cardiac Doppler, Color Doppler and Intracardiac            Opacification Agent Indications:    Fever  History:        Patient has no prior history of Echocardiogram examinations.                 Signs/Symptoms:Fever. Covid 19. PNA. Fatigue.  Sonographer:    Merrie Roof RDCS Referring Phys: Benedict  1. Left ventricular ejection fraction, by estimation, is 65 to 70%. The left ventricle has normal function. The left ventricle has no regional wall  motion abnormalities. Left ventricular diastolic parameters are consistent with Grade I diastolic dysfunction (impaired relaxation).  2. Right ventricular systolic function is normal. The right ventricular size is normal. There is normal pulmonary artery systolic pressure.  3. The mitral valve is grossly normal. Trivial mitral valve regurgitation.  4. The aortic valve was not well visualized. Aortic valve regurgitation is not visualized. Aortic valve mean gradient measures 11.0 mmHg.  5. The inferior vena cava is normal in size with greater than 50% respiratory variability, suggesting right atrial pressure of 3 mmHg.  6. Increased flow velocities may be secondary to anemia, thyrotoxicosis, hyperdynamic or high flow state. Comparison(s): No prior Echocardiogram. FINDINGS  Left Ventricle: Left ventricular ejection fraction, by estimation, is 65 to 70%. The left ventricle has normal function. The left ventricle has no regional wall motion abnormalities. The left ventricular internal cavity size was normal in size. There is  no left ventricular hypertrophy. Left ventricular diastolic parameters are consistent with Grade I diastolic dysfunction (impaired relaxation). Indeterminate filling pressures. Right Ventricle: The right ventricular size is normal. No increase in right ventricular wall thickness. Right ventricular systolic function is normal. There is normal pulmonary artery systolic pressure. The tricuspid regurgitant velocity is 2.66 m/s, and  with an assumed right atrial pressure of 3 mmHg, the estimated right ventricular systolic pressure is 62.7 mmHg. Left Atrium: Left atrial size was normal in size. Right Atrium: Right atrial size was normal in size. Pericardium: There is no evidence of pericardial effusion. Mitral Valve: The mitral valve is grossly normal. Trivial mitral valve regurgitation. Tricuspid Valve: The tricuspid valve is grossly normal. Tricuspid valve regurgitation is trivial. Aortic Valve: The  aortic valve was not well visualized. Aortic valve regurgitation is not visualized. Aortic valve mean gradient measures 11.0 mmHg. Aortic valve peak gradient measures 18.7 mmHg. Aortic valve area, by VTI measures 1.78 cm. Pulmonic Valve: The pulmonic valve was grossly normal. Pulmonic valve regurgitation is trivial. Aorta: The aortic root and ascending aorta are structurally normal, with no evidence of dilitation. Venous: The inferior vena cava is normal in size with greater than 50% respiratory variability, suggesting right atrial pressure of 3 mmHg. IAS/Shunts: No atrial level shunt detected by color flow Doppler.  LEFT VENTRICLE PLAX 2D LVIDd:         4.70 cm  Diastology LVIDs:         2.90 cm  LV e' medial:    7.29 cm/s LV PW:  1.10 cm  LV E/e' medial:  13.6 LV IVS:        1.00 cm  LV e' lateral:   7.51 cm/s LVOT diam:     2.00 cm  LV E/e' lateral: 13.2 LV SV:         70 LV SV Index:   31 LVOT Area:     3.14 cm  RIGHT VENTRICLE RV Basal diam:  4.00 cm LEFT ATRIUM             Index       RIGHT ATRIUM           Index LA diam:        4.50 cm 1.97 cm/m  RA Area:     14.20 cm LA Vol (A2C):   85.9 ml 37.57 ml/m RA Volume:   31.90 ml  13.95 ml/m LA Vol (A4C):   60.0 ml 26.24 ml/m LA Biplane Vol: 75.8 ml 33.15 ml/m  AORTIC VALVE AV Area (Vmax):    2.17 cm AV Area (Vmean):   1.98 cm AV Area (VTI):     1.78 cm AV Vmax:           216.00 cm/s AV Vmean:          158.000 cm/s AV VTI:            0.393 m AV Peak Grad:      18.7 mmHg AV Mean Grad:      11.0 mmHg LVOT Vmax:         149.00 cm/s LVOT Vmean:        99.800 cm/s LVOT VTI:          0.223 m LVOT/AV VTI ratio: 0.57  AORTA Ao Root diam: 3.10 cm Ao Asc diam:  3.20 cm MITRAL VALVE               TRICUSPID VALVE MV Area (PHT): 4.80 cm    TR Peak grad:   28.3 mmHg MV Decel Time: 158 msec    TR Vmax:        266.00 cm/s MV E velocity: 99.40 cm/s MV A velocity: 82.30 cm/s  SHUNTS MV E/A ratio:  1.21        Systemic VTI:  0.22 m                             Systemic Diam: 2.00 cm Lyman Bishop MD Electronically signed by Lyman Bishop MD Signature Date/Time: 10/10/2020/5:56:08 PM    Final      Imaging independently reviewed in Epic.    Raynelle [facility] for [facility]ious [facility] [phone] pager 10/12/2020, 10:29 AM  I spent greater than 35 minutes with the patient including greater than 50% of time in face to face counsel of the patient and in coordination of their care.

## 2020-10-12 NOTE — Progress Notes (Signed)
PROGRESS NOTE  Austin Chang PNT:[id number] DOB: 12-Mar-1965 DOA: 10/06/2020 PCP: Josetta Huddle, MD  HPI/Recap of past 63 hours: 55 year old male with medical history significant for ulcerative colitis on Humira, hypertension, ankylosing spondylitis, presents to the ED for evaluation of fever of unknown origin that began on 10/02/2020.  So far extensive work-up has been unremarkable.  ID on board.   Today, patient continues to spike temp, reports lower back pain, noted some abdominal distention and an episode of diarrhea, as well as bilateral pedal edema. Patient denies any nausea/vomiting, chest pain, abdominal pain.  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  Essential hypertension Continue to hold antihypertensive medication  Obesity Lifestyle modification advised    Estimated body mass index is 39.89 kg/m as calculated from the following:   Height as of this encounter: 5' 8"  (1.727 m).   Weight as of this encounter: 119 kg.     Code Status: Full  Family Communication: None at bedside  Disposition Plan: Status is: Inpatient  Remains inpatient appropriate because:Inpatient level of care appropriate due to  severity of illness  Dispo: The patient is from: Home              Anticipated d/c is to: Home              Patient currently is not medically stable to d/c.   Difficult to place patient No    Consultants: ID  Procedures: None  Antimicrobials: Cefepime  DVT prophylaxis: Lovenox   Objective: Vitals:   10/12/20 0902 10/12/20 0937 10/12/20 1142 10/12/20 1629  BP: 133/86 112/67 128/72 116/64  Pulse:  (!) 116 (!) 105 (!) 107  Resp:   (!) 28 (!) 25  Temp:  (!) 100.4 F (38 C) (!) 100.8 F (38.2 C) (!) 100.5 F (38.1 C)  TempSrc:  Oral Axillary Axillary  SpO2:  92% 94% 96%  Weight:      Height:        Intake/Output Summary (Last 24 hours) at 10/12/2020 1748 Last data filed at 10/12/2020 0940 Gross per 24 hour  Intake --  Output 2700 ml  Net -2700 ml   Filed Weights   10/09/20 0509 10/10/20 0604 10/12/20 0500  Weight: 117.4 kg 118.2 kg 119 kg    Exam: General: NAD, ill appearing   Cardiovascular: S1, S2 present Respiratory: CTAB Abdomen: Soft, nontender, +distended, bowel sounds present Musculoskeletal: ++ bilateral pedal edema noted Skin: Normal Psychiatry: Normal mood     Data Reviewed: CBC: Recent Labs  Lab 10/06/20 1408 10/07/20 0252 10/08/20 0245 10/08/20 0313 10/09/20 0104 10/09/20 0954 10/12/20 0849  WBC 8.3   < > THIS TEST WAS ORDERED IN ERROR AND HAS BEEN CREDITED. 11.8* 12.2* 12.4* 13.9*  NEUTROABS 6.7  --   --  9.2*  --  10.7*  12.4*  HGB 14.1   < > THIS TEST WAS ORDERED IN ERROR AND HAS BEEN CREDITED. 13.6 12.5* 12.2* 11.5*  HCT 38.1*   < > THIS TEST WAS ORDERED IN ERROR AND HAS BEEN CREDITED. 38.5* 34.8* 35.6* 32.7*  MCV 90.9   < > THIS TEST WAS ORDERED IN ERROR AND HAS BEEN CREDITED. 94.1 93.5 94.7 93.4  PLT 153   < > THIS TEST WAS ORDERED IN ERROR AND HAS BEEN CREDITED. 174 196 220 297   < > = values in this interval not displayed.   Basic Metabolic Panel: Recent Labs  Lab 10/07/20 0252 10/08/20 0245 10/09/20 0104 10/11/20 0254  10/12/20 0849  NA 125* 127* 123* 118* 122*  K 3.6 4.1 4.0 4.0 4.5  CL 89* 92* 92* 88* 90*  CO2 25 26 22 22 23   GLUCOSE 135* 129* 104* 118* 90  BUN 11 14 19 16 16   CREATININE 1.07 1.13 1.14 0.93 0.99  CALCIUM 8.6* 9.1 8.6* 8.2* 8.0*  MG 1.7  --   --   --   --   PHOS 3.4  --   --   --   --    GFR: Estimated Creatinine Clearance: 103.2 mL/min (by C-G formula based on SCr of 0.99 mg/dL). Liver Function Tests: Recent Labs  Lab 10/06/20 1408 10/12/20 1049  AST 39 43*  ALT 30 27  ALKPHOS 111 204*  BILITOT 1.1 1.3*  PROT 7.3 5.4*  ALBUMIN 3.3* 2.1*   No results for input(s): LIPASE, AMYLASE in the last 168 hours. No results for input(s): AMMONIA in the last 168 hours. Coagulation Profile: No results for input(s): INR, PROTIME in the last 168 hours. Cardiac Enzymes: Recent Labs  Lab 10/09/20 1424  CKTOTAL 55   BNP (last 3 results) No results for input(s): PROBNP in the last 8760 hours. HbA1C: No results for input(s): HGBA1C in the last 72 hours. CBG: No results for input(s): GLUCAP in the last 168 hours. Lipid Profile: Recent Labs    10/12/20 1049  TRIG 171*   Thyroid Function Tests: No results for input(s): TSH, T4TOTAL, FREET4, T3FREE, THYROIDAB in the last 72 hours. Anemia Panel: Recent Labs    10/12/20 1049  FERRITIN 1,403*   Urine analysis:    Component Value Date/Time   COLORURINE AMBER (A) 10/06/2020 1408   APPEARANCEUR CLEAR 10/06/2020 1408   LABSPEC 1.010 10/06/2020 1408   PHURINE 6.0 10/06/2020 1408   GLUCOSEU NEGATIVE 10/06/2020 1408   HGBUR MODERATE (A) 10/06/2020 1408   BILIRUBINUR NEGATIVE 10/06/2020 1408   KETONESUR NEGATIVE 10/06/2020 1408   PROTEINUR 30 (A) 10/06/2020 1408   NITRITE NEGATIVE 10/06/2020 1408   LEUKOCYTESUR NEGATIVE 10/06/2020 1408   Sepsis Labs: @LABRCNTIP (procalcitonin:4,lacticidven:4)  ) Recent Results (from the past 240 hour(s))  Resp Panel by RT-PCR (Flu A&B, Covid) Nasopharyngeal Swab     Status: None    Collection Time: 10/06/20  2:08 PM   Specimen: Nasopharyngeal Swab; Nasopharyngeal(NP) swabs in vial transport medium  Result Value Ref Range Status   SARS Coronavirus 2 by RT PCR NEGATIVE NEGATIVE Final    Comment: (NOTE) SARS-CoV-2 target nucleic acids are NOT DETECTED.  The SARS-CoV-2 RNA is generally detectable in upper respiratory specimens during the acute phase of infection. The lowest concentration of SARS-CoV-2 viral copies this assay can detect is 138 copies/mL. A negative result does not preclude SARS-Cov-2 infection and should not be used as the sole basis for treatment or other patient management decisions. A negative result may occur  with  improper specimen collection/handling, submission of specimen other than nasopharyngeal swab, presence of viral mutation(s) within the areas targeted by this assay, and inadequate number of viral copies(<138 copies/mL). A negative result must be combined with clinical observations, patient history, and epidemiological information. The expected result is Negative.  Fact Sheet for Patients:  EntrepreneurPulse.com.au  Fact Sheet for Healthcare Providers:  IncredibleEmployment.be  This test is no t yet approved or cleared by the [place] FDA and  has been authorized for detection and/or diagnosis of SARS-CoV-2 by FDA under an Emergency Use Authorization (EUA). This EUA will remain  in effect (meaning this test can be used) for the duration of the COVID-19 declaration under Section 564(b)(1) of the Act, 21 U.S.C.section 360bbb-3(b)(1), unless the authorization is terminated  or revoked sooner.       Influenza A by PCR NEGATIVE NEGATIVE Final   Influenza B by PCR NEGATIVE NEGATIVE Final    Comment: (NOTE) The Xpert Xpress SARS-CoV-2/FLU/RSV plus assay is intended as an aid in the diagnosis of influenza from Nasopharyngeal swab specimens and should not be used as a sole basis for treatment.  Nasal washings and aspirates are unacceptable for Xpert Xpress SARS-CoV-2/FLU/RSV testing.  Fact Sheet for Patients: EntrepreneurPulse.com.au  Fact Sheet for Healthcare Providers: IncredibleEmployment.be  This test is not yet approved or cleared by the [place] FDA and has been authorized for detection and/or diagnosis of SARS-CoV-2 by FDA under an Emergency Use Authorization (EUA). This EUA will remain in effect (meaning this test can be used) for the duration of the COVID-19 declaration under Section 564(b)(1) of the Act, 21 U.S.C. section 360bbb-3(b)(1), unless the authorization is terminated or revoked.  Performed at [facility], [address] [place], [place] [address]   Blood culture (routine x 2)     Status: None   Collection Time: 10/06/20  7:10 PM   Specimen: Right Antecubital; Blood  Result Value Ref Range Status   Specimen Description   Final    RIGHT ANTECUBITAL Performed at [facility], [address] [place], [place] [address]    Special Requests   Final    BOTTLES DRAWN AEROBIC AND ANAEROBIC Blood Culture results may not be optimal due to an inadequate volume of blood received in culture bottles Performed at [facility], [address] [place], [place] [address]    Culture   Final    NO GROWTH 5 DAYS Performed at [facility], [place] [address], [place], [place] [address]    Report Status 10/11/2020 FINAL  Final  Blood culture (routine x 2)     Status: None   Collection Time: 10/06/20  7:24 PM   Specimen: BLOOD RIGHT WRIST  Result Value Ref Range Status   Specimen Description   Final    BLOOD RIGHT WRIST Performed at [facility], [address] [place], [place] [address]    Special Requests   Final    BOTTLES DRAWN AEROBIC AND ANAEROBIC Blood Culture adequate volume Performed at [facility], [address] [place], [place] [address]    Culture    Final    NO GROWTH 5 DAYS Performed at [facility], [place] [address], [place], [place] [address]    Report Status 10/11/2020 FINAL  Final  Respiratory (~20 pathogens) panel by PCR     Status: None   Collection Time: 10/06/20  7:51 PM   Specimen: Nasopharyngeal  Swab; Respiratory  Result Value Ref Range Status   Adenovirus NOT DETECTED NOT DETECTED Final   Coronavirus 229E NOT DETECTED NOT DETECTED Final    Comment: (NOTE) The Coronavirus on the Respiratory Panel, DOES NOT test for the novel  Coronavirus (2019 nCoV)    Coronavirus HKU1 NOT DETECTED NOT DETECTED Final   Coronavirus NL63 NOT DETECTED NOT DETECTED Final   Coronavirus OC43 NOT DETECTED NOT DETECTED Final   Metapneumovirus NOT DETECTED NOT DETECTED Final   Rhinovirus / Enterovirus NOT DETECTED NOT DETECTED Final   Influenza A NOT DETECTED NOT DETECTED Final   Influenza B NOT DETECTED NOT DETECTED Final   Parainfluenza Virus 1 NOT DETECTED NOT DETECTED Final   Parainfluenza Virus 2 NOT DETECTED NOT DETECTED Final   Parainfluenza Virus 3 NOT DETECTED NOT DETECTED Final   Parainfluenza Virus 4 NOT DETECTED NOT DETECTED Final   Respiratory Syncytial Virus NOT DETECTED NOT DETECTED Final   Bordetella pertussis NOT DETECTED NOT DETECTED Final   Bordetella Parapertussis NOT DETECTED NOT DETECTED Final   Chlamydophila pneumoniae NOT DETECTED NOT DETECTED Final   Mycoplasma pneumoniae NOT DETECTED NOT DETECTED Final    Comment: Performed at [facility], [place], [place], [place] [address]  Resp Panel by RT-PCR (Flu A&B, Covid) Nasopharyngeal Swab     Status: None   Collection Time: 10/06/20  7:51 PM   Specimen: Nasopharyngeal Swab; Nasopharyngeal(NP) swabs in vial transport medium  Result Value Ref Range Status   SARS Coronavirus 2 by RT PCR NEGATIVE NEGATIVE Final    Comment: (NOTE) SARS-CoV-2 target nucleic acids are NOT DETECTED.  The SARS-CoV-2 RNA is generally detectable in upper respiratory specimens  during the acute phase of infection. The lowest concentration of SARS-CoV-2 viral copies this assay can detect is 138 copies/mL. A negative result does not preclude SARS-Cov-2 infection and should not be used as the sole basis for treatment or other patient management decisions. A negative result may occur with  improper specimen collection/handling, submission of specimen other than nasopharyngeal swab, presence of viral mutation(s) within the areas targeted by this assay, and inadequate number of viral copies(<138 copies/mL). A negative result must be combined with clinical observations, patient history, and epidemiological information. The expected result is Negative.  Fact Sheet for Patients:  EntrepreneurPulse.com.au  Fact Sheet for Healthcare Providers:  IncredibleEmployment.be  This test is no t yet approved or cleared by the [place] FDA and  has been authorized for detection and/or diagnosis of SARS-CoV-2 by FDA under an Emergency Use Authorization (EUA). This EUA will remain  in effect (meaning this test can be used) for the duration of the COVID-19 declaration under Section 564(b)(1) of the Act, 21 U.S.C.section 360bbb-3(b)(1), unless the authorization is terminated  or revoked sooner.       Influenza A by PCR NEGATIVE NEGATIVE Final   Influenza B by PCR NEGATIVE NEGATIVE Final    Comment: (NOTE) The Xpert Xpress SARS-CoV-2/FLU/RSV plus assay is intended as an aid in the diagnosis of influenza from Nasopharyngeal swab specimens and should not be used as a sole basis for treatment. Nasal washings and aspirates are unacceptable for Xpert Xpress SARS-CoV-2/FLU/RSV testing.  Fact Sheet for Patients: EntrepreneurPulse.com.au  Fact Sheet for Healthcare Providers: IncredibleEmployment.be  This test is not yet approved or cleared by the [place] FDA and has been authorized for detection  and/or diagnosis of SARS-CoV-2 by FDA under an Emergency Use Authorization (EUA). This EUA will remain in effect (meaning this test can be  used) for the duration of the COVID-19 declaration under Section 564(b)(1) of the Act, 21 U.S.C. section 360bbb-3(b)(1), unless the authorization is terminated or revoked.  Performed at [facility], [address] [place], [place] [address]   MRSA Next Gen by PCR, Nasal     Status: None   Collection Time: 10/07/20 11:53 AM   Specimen: Nasal Mucosa; Nasal Swab  Result Value Ref Range Status   MRSA by PCR Next Gen NOT DETECTED NOT DETECTED Final    Comment: (NOTE) The GeneXpert MRSA Assay (FDA approved for NASAL specimens only), is one component of a comprehensive MRSA colonization surveillance program. It is not intended to diagnose MRSA infection nor to guide or monitor treatment for MRSA infections. Test performance is not FDA approved in patients less than 91 years old. Performed at [facility], [place] [address], [place], [place] [address]       Studies: No results found.  Scheduled Meds:  allopurinol  300 mg Oral Daily   amLODipine  10 mg Oral Daily   enoxaparin (LOVENOX) injection  40 mg Subcutaneous Daily   nystatin  5 mL Oral QID    Continuous Infusions:  sodium chloride 10 mL/hr at 10/09/20 0636   ceFEPime (MAXIPIME) IV 2 g (10/12/20 1342)     LOS: 6 days     Alma Friendly, MD [facility]  If 7PM-7AM, please contact night-coverage [email or web address] 10/12/2020, 5:48 PM

## 2020-10-12 NOTE — Progress Notes (Signed)
MRI tech called and stated that pt was not able to go thru the MRI procedure. Pt verbalized that he has an intense fear of tight spaces.

## 2020-10-13 DIAGNOSIS — K529 Noninfective gastroenteritis and colitis, unspecified: Secondary | ICD-10-CM | POA: Diagnosis not present

## 2020-10-13 DIAGNOSIS — I10 Essential (primary) hypertension: Secondary | ICD-10-CM | POA: Diagnosis not present

## 2020-10-13 DIAGNOSIS — E871 Hypo-osmolality and hyponatremia: Secondary | ICD-10-CM | POA: Diagnosis not present

## 2020-10-13 DIAGNOSIS — R509 Fever, unspecified: Secondary | ICD-10-CM | POA: Diagnosis not present

## 2020-10-13 LAB — CBC WITH DIFFERENTIAL/PLATELET
Abs Immature Granulocytes: 0.18 10*3/uL — ABNORMAL HIGH (ref 0.00–0.07)
Basophils Absolute: 0 10*3/uL (ref 0.0–0.1)
Basophils Relative: 0 %
Eosinophils Absolute: 0.1 10*3/uL (ref 0.0–0.5)
Eosinophils Relative: 1 %
HCT: 32.4 % — ABNORMAL LOW (ref 39.0–52.0)
Hemoglobin: 11.5 g/dL — ABNORMAL LOW (ref 13.0–17.0)
Immature Granulocytes: 1 %
Lymphocytes Relative: 5 %
Lymphs Abs: 0.7 10*3/uL (ref 0.7–4.0)
MCH: 32.8 pg (ref 26.0–34.0)
MCHC: 35.5 g/dL (ref 30.0–36.0)
MCV: 92.3 fL (ref 80.0–100.0)
Monocytes Absolute: 0.7 10*3/uL (ref 0.1–1.0)
Monocytes Relative: 5 %
Neutro Abs: 13 10*3/uL — ABNORMAL HIGH (ref 1.7–7.7)
Neutrophils Relative %: 88 %
Platelets: 303 10*3/uL (ref 150–400)
RBC: 3.51 MIL/uL — ABNORMAL LOW (ref 4.22–5.81)
RDW: 12.4 % (ref 11.5–15.5)
WBC: 14.7 10*3/uL — ABNORMAL HIGH (ref 4.0–10.5)
nRBC: 0 % (ref 0.0–0.2)

## 2020-10-13 LAB — BASIC METABOLIC PANEL
Anion gap: 8 (ref 5–15)
BUN: 16 mg/dL (ref 6–20)
CO2: 24 mmol/L (ref 22–32)
Calcium: 7.9 mg/dL — ABNORMAL LOW (ref 8.9–10.3)
Chloride: 89 mmol/L — ABNORMAL LOW (ref 98–111)
Creatinine, Ser: 0.98 mg/dL (ref 0.61–1.24)
GFR, Estimated: >60 mL/min (ref >60)
Glucose, Bld: 110 mg/dL — ABNORMAL HIGH (ref 70–99)
Potassium: 3.6 mmol/L (ref 3.5–5.1)
Sodium: 121 mmol/L — ABNORMAL LOW (ref 135–145)

## 2020-10-13 LAB — SODIUM, URINE, RANDOM: Sodium, Ur: <10 mmol/L

## 2020-10-13 LAB — PROCALCITONIN: Procalcitonin: 6.83 ng/mL

## 2020-10-13 LAB — OSMOLALITY, URINE: Osmolality, Ur: 491 mOsm/kg (ref 300–900)

## 2020-10-13 LAB — OSMOLALITY: Osmolality: 264 mOsm/kg — ABNORMAL LOW (ref 275–295)

## 2020-10-13 NOTE — Progress Notes (Signed)
PROGRESS NOTE  Austin Chang:[id number] DOB: 01-23-1966 DOA: 10/06/2020 PCP: Josetta Huddle, MD  HPI/Recap of past 61 hours: 55 year old male with medical history significant for ulcerative colitis on Humira, hypertension, ankylosing spondylitis, presents to the ED for evaluation of fever of unknown origin that began on 10/02/2020.  So far extensive work-up has been unremarkable.  ID on board.   Today, pt continues to spike temp, denies any new complaints. Unable to tolerate MRI even if sedated. Further work up in progress.  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 Inflammatory markers, procalcitonin all elevated Repeat BC X2 NGTD CT abdomen/pelvis, chest unremarkable ID on board, recommend back imaging due to pain, unable to tolerate MRI even with sedation, CT lumbar spine pending Discussed with Dr Burr [facility] oncology on 10/13/20, about abnormal SPEP lab, stated it may be ??MGUS (unlikely to cause fever), further labs ordered to further evaluate Doppler LE pending to r/o DVT Completed 7 days of antibiotics, stop as per ID Nystatin swish and swallow   Hypervolemic hyponatremia Appears vol overloaded Stop IVFs, start IV lasix daily Serum osmolality pending, urine osmolality 491, urine sodium <10 Daily BMP  Hx of diastolic HF ECHO showed EF of 65-70%, no RWMA, grade 1DD Appears overloaded CXR unremarkable Continue IV lasix daily  Ulcerative colitis Resume Humira as an outpatient.  Essential hypertension Continue to hold antihypertensive medication  Obesity Lifestyle modification advised    Estimated body mass index is 39.2 kg/m as calculated from the following:   Height as of this encounter: 5' 8"  (1.727 m).   Weight as of this  encounter: 116.9 kg.     Code Status: Full  Family Communication: None at bedside  Disposition Plan: Status is: Inpatient  Remains inpatient appropriate because:Inpatient level of care appropriate due to severity of illness  Dispo: The patient is from: Home              Anticipated d/c is to: Home              Patient currently is not medically stable to d/c.   Difficult to place patient No    Consultants: ID  Procedures: None  Antimicrobials: Cefepime  DVT prophylaxis: Lovenox   Objective: Vitals:   10/13/20 0045 10/13/20 0432 10/13/20 0825 10/13/20 1125  BP: 113/65 130/72 131/76 126/70  Pulse: (!) 109 (!) 109 (!) 107 (!) 115  Resp: 19 19 18 19   Temp: 99.7 F (37.6 C) 98.5 F (36.9 C) 98.7 F (37.1 C) 98.9 F (37.2 C)  TempSrc: Oral Oral Oral Oral  SpO2: 93% 94%    Weight:  116.9 kg    Height:        Intake/Output Summary (Last 24 hours) at 10/13/2020 1547 Last data filed at 10/13/2020 1400 Gross per 24 hour  Intake 800 ml  Output 2220 ml  Net -1420 ml   Filed Weights   10/10/20 0604 10/12/20 0500 10/13/20 0432  Weight: 118.2 kg 119 kg 116.9 kg    Exam: General: NAD, ill appearing   Cardiovascular: S1, S2 present Respiratory: CTAB Abdomen: Soft, nontender, +distended, bowel sounds present Musculoskeletal: ++ bilateral pedal edema noted Skin: Normal Psychiatry: Normal mood     Data Reviewed: CBC: Recent Labs  Lab 10/08/20 0313 10/09/20 0104 10/09/20 0954 10/12/20 0849 10/13/20  0334  WBC 11.8* 12.2* 12.4* 13.9* 14.7*  NEUTROABS 9.2*  --  10.7* 12.4* 13.0*  HGB 13.6 12.5* 12.2* 11.5* 11.5*  HCT 38.5* 34.8* 35.6* 32.7* 32.4*  MCV 94.1 93.5 94.7 93.4 92.3  PLT 174 196 220 297 250   Basic Metabolic Panel: Recent Labs  Lab 10/07/20 0252 10/08/20 0245 10/09/20 0104 10/11/20 0254 10/12/20 0849 10/13/20 0334  NA 125* 127* 123* 118* 122* 121*  K 3.6 4.1 4.0 4.0 4.5 3.6  CL 89* 92* 92* 88* 90* 89*  CO2 25 26 22 22 23 24   GLUCOSE  135* 129* 104* 118* 90 110*  BUN 11 14 19 16 16 16   CREATININE 1.07 1.13 1.14 0.93 0.99 0.98  CALCIUM 8.6* 9.1 8.6* 8.2* 8.0* 7.9*  MG 1.7  --   --   --   --   --   PHOS 3.4  --   --   --   --   --    GFR: Estimated Creatinine Clearance: 103.3 mL/min (by C-G formula based on SCr of 0.98 mg/dL). Liver Function Tests: Recent Labs  Lab 10/12/20 1049  AST 43*  ALT 27  ALKPHOS 204*  BILITOT 1.3*  PROT 5.4*  ALBUMIN 2.1*   No results for input(s): LIPASE, AMYLASE in the last 168 hours. No results for input(s): AMMONIA in the last 168 hours. Coagulation Profile: No results for input(s): INR, PROTIME in the last 168 hours. Cardiac Enzymes: Recent Labs  Lab 10/09/20 1424  CKTOTAL 55   BNP (last 3 results) No results for input(s): PROBNP in the last 8760 hours. HbA1C: No results for input(s): HGBA1C in the last 72 hours. CBG: No results for input(s): GLUCAP in the last 168 hours. Lipid Profile: Recent Labs    10/12/20 1049  TRIG 171*   Thyroid Function Tests: No results for input(s): TSH, T4TOTAL, FREET4, T3FREE, THYROIDAB in the last 72 hours. Anemia Panel: Recent Labs    10/12/20 1049  FERRITIN 1,403*   Urine analysis:    Component Value Date/Time   COLORURINE AMBER (A) 10/06/2020 1408   APPEARANCEUR CLEAR 10/06/2020 1408   LABSPEC 1.010 10/06/2020 1408   PHURINE 6.0 10/06/2020 1408   GLUCOSEU NEGATIVE 10/06/2020 1408   HGBUR MODERATE (A) 10/06/2020 1408   BILIRUBINUR NEGATIVE 10/06/2020 1408   KETONESUR NEGATIVE 10/06/2020 1408   PROTEINUR 30 (A) 10/06/2020 1408   NITRITE NEGATIVE 10/06/2020 1408   LEUKOCYTESUR NEGATIVE 10/06/2020 1408   Sepsis Labs: @LABRCNTIP (procalcitonin:4,lacticidven:4)  ) Recent Results (from the past 240 hour(s))  Resp Panel by RT-PCR (Flu A&B, Covid) Nasopharyngeal Swab     Status: None   Collection Time: 10/06/20  2:08 PM   Specimen: Nasopharyngeal Swab; Nasopharyngeal(NP) swabs in vial transport medium  Result Value Ref  Range Status   SARS Coronavirus 2 by RT PCR NEGATIVE NEGATIVE Final    Comment: (NOTE) SARS-CoV-2 target nucleic acids are NOT DETECTED.  The SARS-CoV-2 RNA is generally detectable in upper respiratory specimens during the acute phase of infection. The lowest concentration of SARS-CoV-2 viral copies this assay can detect is 138 copies/mL. A negative result does not preclude SARS-Cov-2 infection and should not be used as the sole basis for treatment or other patient management decisions. A negative result may occur with  improper specimen collection/handling, submission of specimen other than nasopharyngeal swab, presence of viral mutation(s) within the areas targeted by this assay, and inadequate number of viral copies(<138 copies/mL). A negative result must be combined with clinical observations, patient history, and epidemiological  information. The expected result is Negative.  Fact Sheet for Patients:  EntrepreneurPulse.com.au  Fact Sheet for Healthcare Providers:  IncredibleEmployment.be  This test is no t yet approved or cleared by the [place] FDA and  has been authorized for detection and/or diagnosis of SARS-CoV-2 by FDA under an Emergency Use Authorization (EUA). This EUA will remain  in effect (meaning this test can be used) for the duration of the COVID-19 declaration under Section 564(b)(1) of the Act, 21 U.S.C.section 360bbb-3(b)(1), unless the authorization is terminated  or revoked sooner.       Influenza A by PCR NEGATIVE NEGATIVE Final   Influenza B by PCR NEGATIVE NEGATIVE Final    Comment: (NOTE) The Xpert Xpress SARS-CoV-2/FLU/RSV plus assay is intended as an aid in the diagnosis of influenza from Nasopharyngeal swab specimens and should not be used as a sole basis for treatment. Nasal washings and aspirates are unacceptable for Xpert Xpress SARS-CoV-2/FLU/RSV testing.  Fact Sheet for  Patients: EntrepreneurPulse.com.au  Fact Sheet for Healthcare Providers: IncredibleEmployment.be  This test is not yet approved or cleared by the [place] FDA and has been authorized for detection and/or diagnosis of SARS-CoV-2 by FDA under an Emergency Use Authorization (EUA). This EUA will remain in effect (meaning this test can be used) for the duration of the COVID-19 declaration under Section 564(b)(1) of the Act, 21 U.S.C. section 360bbb-3(b)(1), unless the authorization is terminated or revoked.  Performed at [facility], [address] [place], [place] [address]   Blood culture (routine x 2)     Status: None   Collection Time: 10/06/20  7:10 PM   Specimen: Right Antecubital; Blood  Result Value Ref Range Status   Specimen Description   Final    RIGHT ANTECUBITAL Performed at [facility], [address] [place], [place] [address]    Special Requests   Final    BOTTLES DRAWN AEROBIC AND ANAEROBIC Blood Culture results may not be optimal due to an inadequate volume of blood received in culture bottles Performed at [facility], [address] [place], [place] [address]    Culture   Final    NO GROWTH 5 DAYS Performed at [facility], [place] [address], [place], [place] [address]    Report Status 10/11/2020 FINAL  Final  Blood culture (routine x 2)     Status: None   Collection Time: 10/06/20  7:24 PM   Specimen: BLOOD RIGHT WRIST  Result Value Ref Range Status   Specimen Description   Final    BLOOD RIGHT WRIST Performed at [facility], [address] [place], [place] [address]    Special Requests   Final    BOTTLES DRAWN AEROBIC AND ANAEROBIC Blood Culture adequate volume Performed at [facility], [address] [place], [place] [address]    Culture   Final    NO GROWTH 5 DAYS Performed at [facility], [place] [address] [place], [place] [address]     Report Status 10/11/2020 FINAL  Final  Respiratory (~20 pathogens) panel by PCR     Status: None   Collection Time: 10/06/20  7:51 PM   Specimen: Nasopharyngeal Swab; Respiratory  Result Value Ref Range Status   Adenovirus NOT DETECTED NOT DETECTED Final   Coronavirus 229E NOT DETECTED NOT DETECTED Final    Comment: (NOTE) The Coronavirus on the Respiratory Panel, DOES NOT test for the novel  Coronavirus (2019 nCoV)    Coronavirus HKU1 NOT DETECTED NOT DETECTED Final   Coronavirus NL63 NOT DETECTED NOT DETECTED Final   Coronavirus OC43 NOT DETECTED NOT DETECTED Final   Metapneumovirus NOT DETECTED NOT DETECTED Final   Rhinovirus / Enterovirus NOT DETECTED NOT DETECTED Final   Influenza A NOT DETECTED NOT DETECTED Final   Influenza B NOT DETECTED NOT DETECTED Final   Parainfluenza Virus 1 NOT DETECTED NOT DETECTED Final   Parainfluenza Virus 2 NOT DETECTED NOT DETECTED Final   Parainfluenza Virus 3 NOT DETECTED NOT DETECTED Final   Parainfluenza Virus 4 NOT DETECTED NOT DETECTED Final   Respiratory Syncytial Virus NOT DETECTED NOT DETECTED Final   Bordetella pertussis NOT DETECTED NOT DETECTED Final   Bordetella Parapertussis NOT DETECTED NOT DETECTED Final   Chlamydophila pneumoniae NOT DETECTED NOT DETECTED Final   Mycoplasma pneumoniae NOT DETECTED NOT DETECTED Final    Comment: Performed at [facility], [place] [address], [place], [place] [address]  Resp Panel by RT-PCR (Flu A&B, Covid) Nasopharyngeal Swab     Status: None   Collection Time: 10/06/20  7:51 PM   Specimen: Nasopharyngeal Swab; Nasopharyngeal(NP) swabs in vial transport medium  Result Value Ref Range Status   SARS Coronavirus 2 by RT PCR NEGATIVE NEGATIVE Final    Comment: (NOTE) SARS-CoV-2 target nucleic acids are NOT DETECTED.  The SARS-CoV-2 RNA is generally detectable in upper respiratory specimens during the acute phase of infection. The lowest concentration of SARS-CoV-2 viral copies this assay can  detect is 138 copies/mL. A negative result does not preclude SARS-Cov-2 infection and should not be used as the sole basis for treatment or other patient management decisions. A negative result may occur with  improper specimen collection/handling, submission of specimen other than nasopharyngeal swab, presence of viral mutation(s) within the areas targeted by this assay, and inadequate number of viral copies(<138 copies/mL). A negative result must be combined with clinical observations, patient history, and epidemiological information. The expected result is Negative.  Fact Sheet for Patients:  EntrepreneurPulse.com.au  Fact Sheet for Healthcare Providers:  IncredibleEmployment.be  This test is no t yet approved or cleared by the [place] FDA and  has been authorized for detection and/or diagnosis of SARS-CoV-2 by FDA under an Emergency Use Authorization (EUA). This EUA will remain  in effect (meaning this test can be used) for the duration of the COVID-19 declaration under Section 564(b)(1) of the Act, 21 U.S.C.section 360bbb-3(b)(1), unless the authorization is terminated  or revoked sooner.       Influenza A by PCR NEGATIVE NEGATIVE Final   Influenza B by PCR NEGATIVE NEGATIVE Final    Comment: (NOTE) The Xpert Xpress SARS-CoV-2/FLU/RSV plus assay is intended as an aid in the diagnosis of influenza from Nasopharyngeal swab specimens and should not be used as a sole basis for treatment. Nasal washings and aspirates are unacceptable for Xpert Xpress SARS-CoV-2/FLU/RSV testing.  Fact Sheet for Patients: EntrepreneurPulse.com.au  Fact Sheet for Healthcare Providers: IncredibleEmployment.be  This test is not yet approved or cleared by the [place] FDA and has been authorized for detection and/or diagnosis of SARS-CoV-2 by FDA under an Emergency Use Authorization (EUA). This EUA will remain in  effect (meaning this test can be used) for the duration of the COVID-19 declaration under Section 564(b)(1) of the Act, 21 U.S.C. section 360bbb-3(b)(1), unless the authorization is terminated or revoked.  Performed at [facility], [address], [place], [place] [address]   MRSA  Next Gen by PCR, Nasal     Status: None   Collection Time: 10/07/20 11:53 AM   Specimen: Nasal Mucosa; Nasal Swab  Result Value Ref Range Status   MRSA by PCR Next Gen NOT DETECTED NOT DETECTED Final    Comment: (NOTE) The GeneXpert MRSA Assay (FDA approved for NASAL specimens only), is one component of a comprehensive MRSA colonization surveillance program. It is not intended to diagnose MRSA infection nor to guide or monitor treatment for MRSA infections. Test performance is not FDA approved in patients less than 6 years old. Performed at [facility], [place] [address], [place], [place] [address]   Culture, blood (routine x 2)     Status: None (Preliminary result)   Collection Time: 10/12/20  8:49 AM   Specimen: BLOOD RIGHT ARM  Result Value Ref Range Status   Specimen Description BLOOD RIGHT ARM  Final   Special Requests   Final    BOTTLES DRAWN AEROBIC AND ANAEROBIC Blood Culture adequate volume   Culture   Final    NO GROWTH < 24 HOURS Performed at [facility], [place] [address], [place], [place] [address]    Report Status PENDING  Incomplete  Culture, blood (routine x 2)     Status: None (Preliminary result)   Collection Time: 10/12/20  9:03 AM   Specimen: BLOOD RIGHT HAND  Result Value Ref Range Status   Specimen Description BLOOD RIGHT HAND  Final   Special Requests   Final    BOTTLES DRAWN AEROBIC AND ANAEROBIC Blood Culture adequate volume   Culture   Final    NO GROWTH < 24 HOURS Performed at [facility], [place] [address], [place],  [address]    Report Status PENDING  Incomplete      Studies: DG Chest Port 1 View  Result Date:  10/12/2020 CLINICAL DATA:  Fever, shortness of breath, and abdominal distension. EXAM: PORTABLE CHEST 1 VIEW COMPARISON:  Chest x-ray and CTA chest dated October 06, 2020. FINDINGS: The heart size and mediastinal contours are within normal limits. Both lungs are clear. The visualized skeletal structures are unremarkable. IMPRESSION: No active disease. Electronically Signed   By: Titus Dubin M.D.   On: 10/12/2020 17:56   DG Abd Portable 1V  Result Date: 10/12/2020 CLINICAL DATA:  Fever, shortness of breath, and abdominal distension. EXAM: PORTABLE ABDOMEN - 1 VIEW COMPARISON:  CT abdomen pelvis dated October 10, 2020. Abdominal x-ray dated August 17, 2010. FINDINGS: The bowel gas pattern is normal. Unchanged mild gaseous distension of the transverse colon. No radio-opaque calculi or other significant radiographic abnormality are seen. IMPRESSION: 1. Negative. Electronically Signed   By: Titus Dubin M.D.   On: 10/12/2020 17:57    Scheduled Meds:  allopurinol  300 mg Oral Daily   amLODipine  10 mg Oral Daily   enoxaparin (LOVENOX) injection  40 mg Subcutaneous Daily   furosemide  40 mg Intravenous Daily   nystatin  5 mL Oral QID    Continuous Infusions:  sodium chloride 10 mL/hr at 10/09/20 0636     LOS: 7 days     Alma Friendly, MD [facility]  If 7PM-7AM, please contact night-coverage [email or web address] 10/13/2020, 3:48 PM

## 2020-10-13 NOTE — Progress Notes (Signed)
[facility]  Date of Admission:  10/06/2020           Reason for visit: Follow up on fevers  Current antibiotics: Cefepime 9/8-present     Previous antibiotics: Azithromycin x1 dose 9/8  Ceftriaxone x1 dose 9/8 Doxycycline 9/10-9/11 Vancomycin 9/8-9/9 Metronidazole 9/8-9/10; 9/12-9/13   ASSESSMENT:    55 y.o. male admitted with:  Fevers of unclear etiology.  Extensive work-up thus far has been unremarkable with negative CTA of the chest and CT abdomen/pelvis.  Repeat abdominal and chest x-ray yesterday also negative.  TTE did not show any significant abnormalities.  Lower extremity Dopplers were negative for DVT.  Blood cultures negative x2 on admission and repeat blood cultures 9/14 NGTD.  COVID PCR negative on admission, respiratory viral panel negative.  Other negative infectious serologies include CMV, hepatitis panel, RPR, HIV, histoplasma urine Ag, Mycoplasma IgM, Legionella Urine Ag.  Negative autoimmune labs include antidouble-stranded DNA, rheumatoid factor, CK, ACE level.  Pending labs include cryoglobulin and ANA.  Of note, procalcitonin significantly elevated along with sed rate, CRP, and ferritin.   Continues to have hyponatremia, elevated WBC.  SPEP with M spike observed at concentration of 0.2g/dL Ulcerative colitis on Humira Hyponatremia Low back pain.  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   Also considered drug fever which does not seem as likely but stopping cefepime regardless He  meets some criteria for [place] so will send out soluble IL 2 He was unable to tolerate MRI lumbar spine and does not want to pursue another even with sedation.  Could consider CT lumbar spine for further evaluation Unsure of how to interpret SPEP or if it is of significance.  Consider hematology/oncology evaluation given constellation of presentation Will follow.   Principal Problem:   Sepsis ([place]) Active Problems:   Essential hypertension   Inflammatory bowel disease   Hyponatremia   Bilateral pneumonia    MEDICATIONS:    Scheduled Meds:  allopurinol  300 mg Oral Daily   amLODipine  10 mg Oral Daily   enoxaparin (LOVENOX) injection  40 mg Subcutaneous Daily   furosemide  40 mg Intravenous Daily   nystatin  5 mL Oral QID   Continuous Infusions:  sodium chloride 10 mL/hr at 10/09/20 0636   ceFEPime (MAXIPIME) IV 2 g (10/13/20 0441)   PRN Meds:.sodium chloride, acetaminophen, alum & mag hydroxide-simeth, ondansetron **OR** ondansetron (ZOFRAN) IV, oxyCODONE, zolpidem  SUBJECTIVE:   24 hour events:  Continues to be febrile, T-max 101.5 Normotensive Tachycardic Abdominal films negative, chest x-ray negative Unable to tolerate MRI overnight due to claustrophobia Sodium 121, creatinine 0.9 WBC stable at 14 Procalcitonin continues to be elevated LFTs minimally elevated CRP and ESR stable Repeat blood cultures no growth Urine histo antigen negative  No new issues reported.  Back pain is unchanged.  No other localizing symptoms reported today.  Agreeable to stopping antibiotics.   Review of Systems  All other systems reviewed and are negative.    OBJECTIVE:   Blood pressure 131/76, pulse (!) 107, temperature 98.7 F (37.1 C), temperature source Oral,  resp. rate 18, height 5' 8"  (1.727 m), weight 116.9 kg, SpO2 94 %. Body mass index is 39.2 kg/m.  Physical Exam Constitutional:      General: He is not in acute distress.    Appearance: Normal appearance.  HENT:      Head: Normocephalic and atraumatic.  Eyes:     Extraocular Movements: Extraocular movements intact.     Conjunctiva/sclera: Conjunctivae normal.  Pulmonary:     Effort: Pulmonary effort is normal. No respiratory distress.  Abdominal:     General: There is no distension.     Palpations: Abdomen is soft.     Tenderness: There is no abdominal tenderness. There is no guarding or rebound.  Musculoskeletal:        General: Normal range of motion.     Cervical back: Normal range of motion and neck supple.  Skin:    General: Skin is warm and dry.  Neurological:     General: No focal deficit present.     Mental Status: He is alert and oriented to person, place, and time.  Psychiatric:        Mood and Affect: Mood normal.        Behavior: Behavior normal.     Lab Results: Lab Results  Component Value Date   WBC 14.7 (H) 10/13/2020   HGB 11.5 (L) 10/13/2020   HCT 32.4 (L) 10/13/2020   MCV 92.3 10/13/2020   PLT 303 10/13/2020    Lab Results  Component Value Date   NA 121 (L) 10/13/2020   K 3.6 10/13/2020   CO2 24 10/13/2020   GLUCOSE 110 (H) 10/13/2020   BUN 16 10/13/2020   CREATININE 0.98 10/13/2020   CALCIUM 7.9 (L) 10/13/2020   GFRNONAA >60 10/13/2020   GFRAA >60 08/18/2010    Lab Results  Component Value Date   ALT 27 10/12/2020   AST 43 (H) 10/12/2020   ALKPHOS 204 (H) 10/12/2020   BILITOT 1.3 (H) 10/12/2020       Component Value Date/Time   CRP 23.0 (H) 10/12/2020 1049       Component Value Date/Time   ESRSEDRATE 85 (H) 10/12/2020 1049     I have reviewed the micro and lab results in Epic.  Imaging: DG Chest Port 1 View  Result Date: 10/12/2020 CLINICAL DATA:  Fever, shortness of breath, and abdominal distension. EXAM: PORTABLE CHEST 1 VIEW COMPARISON:  Chest x-ray and CTA chest dated October 06, 2020. FINDINGS: The heart size and mediastinal contours are within normal limits. Both lungs are clear. The visualized skeletal structures are unremarkable.  IMPRESSION: No active disease. Electronically Signed   By: Titus Dubin M.D.   On: 10/12/2020 17:56   DG Abd Portable 1V  Result Date: 10/12/2020 CLINICAL DATA:  Fever, shortness of breath, and abdominal distension. EXAM: PORTABLE ABDOMEN - 1 VIEW COMPARISON:  CT abdomen pelvis dated October 10, 2020. Abdominal x-ray dated August 17, 2010. FINDINGS: The bowel gas pattern is normal. Unchanged mild gaseous distension of the transverse colon. No radio-opaque calculi or other significant radiographic abnormality are seen. IMPRESSION: 1. Negative. Electronically Signed   By: Titus Dubin M.D.   On: 10/12/2020 17:57     Imaging independently reviewed in Epic.    Rayn[facility] [phone] pager 10/13/2020, 8:40 AM  I spent greater than 35 minutes with the patient including greater than 50% of time in face to face counsel of the patient and in coordination  of their care.

## 2020-10-14 ENCOUNTER — Inpatient Hospital Stay (HOSPITAL_COMMUNITY): Payer: BC Managed Care – PPO

## 2020-10-14 DIAGNOSIS — R509 Fever, unspecified: Secondary | ICD-10-CM | POA: Diagnosis not present

## 2020-10-14 DIAGNOSIS — E871 Hypo-osmolality and hyponatremia: Secondary | ICD-10-CM | POA: Diagnosis not present

## 2020-10-14 DIAGNOSIS — K529 Noninfective gastroenteritis and colitis, unspecified: Secondary | ICD-10-CM | POA: Diagnosis not present

## 2020-10-14 DIAGNOSIS — I10 Essential (primary) hypertension: Secondary | ICD-10-CM | POA: Diagnosis not present

## 2020-10-14 LAB — CBC WITH DIFFERENTIAL/PLATELET
Abs Immature Granulocytes: 0.23 10*3/uL — ABNORMAL HIGH (ref 0.00–0.07)
Basophils Absolute: 0 10*3/uL (ref 0.0–0.1)
Basophils Relative: 0 %
Eosinophils Absolute: 0.1 10*3/uL (ref 0.0–0.5)
Eosinophils Relative: 1 %
HCT: 32.9 % — ABNORMAL LOW (ref 39.0–52.0)
Hemoglobin: 11.8 g/dL — ABNORMAL LOW (ref 13.0–17.0)
Immature Granulocytes: 1 %
Lymphocytes Relative: 7 %
Lymphs Abs: 1.2 10*3/uL (ref 0.7–4.0)
MCH: 33.3 pg (ref 26.0–34.0)
MCHC: 35.9 g/dL (ref 30.0–36.0)
MCV: 92.9 fL (ref 80.0–100.0)
Monocytes Absolute: 0.9 10*3/uL (ref 0.1–1.0)
Monocytes Relative: 6 %
Neutro Abs: 14.4 10*3/uL — ABNORMAL HIGH (ref 1.7–7.7)
Neutrophils Relative %: 85 %
Platelets: 348 10*3/uL (ref 150–400)
RBC: 3.54 MIL/uL — ABNORMAL LOW (ref 4.22–5.81)
RDW: 12.4 % (ref 11.5–15.5)
WBC: 16.9 10*3/uL — ABNORMAL HIGH (ref 4.0–10.5)
nRBC: 0 % (ref 0.0–0.2)

## 2020-10-14 LAB — BASIC METABOLIC PANEL
Anion gap: 12 (ref 5–15)
BUN: 17 mg/dL (ref 6–20)
CO2: 25 mmol/L (ref 22–32)
Calcium: 8.3 mg/dL — ABNORMAL LOW (ref 8.9–10.3)
Chloride: 83 mmol/L — ABNORMAL LOW (ref 98–111)
Creatinine, Ser: 1.05 mg/dL (ref 0.61–1.24)
GFR, Estimated: >60 mL/min (ref >60)
Glucose, Bld: 109 mg/dL — ABNORMAL HIGH (ref 70–99)
Potassium: 3.9 mmol/L (ref 3.5–5.1)
Sodium: 120 mmol/L — ABNORMAL LOW (ref 135–145)

## 2020-10-14 LAB — PROCALCITONIN: Procalcitonin: 6.79 ng/mL

## 2020-10-14 LAB — CORTISOL-AM, BLOOD: Cortisol - AM: 21.1 ug/dL (ref 6.7–22.6)

## 2020-10-14 LAB — TSH: TSH: 2.4 u[IU]/mL (ref 0.350–4.500)

## 2020-10-14 MED ORDER — IOHEXOL 350 MG/ML SOLN
80.0000 mL | Freq: Once | INTRAVENOUS | Status: AC | PRN
  Administered 2020-10-14: 80 mL via INTRAVENOUS

## 2020-10-14 MED ORDER — FUROSEMIDE 10 MG/ML IJ SOLN
60.0000 mg | Freq: Two times a day (BID) | INTRAMUSCULAR | Status: DC
  Administered 2020-10-14 – 2020-10-15 (×3): 60 mg via INTRAVENOUS
  Filled 2020-10-14 (×3): qty 6

## 2020-10-14 NOTE — Progress Notes (Signed)
Physical Therapy Treatment Patient Details Name: Austin Chang MRN: [id number] DOB: [birth year] Today's Date: 10/14/2020   History of Present Illness Austin Chang is an 55 y.o. male past medical history significant for ulcerative colitis on Humira BMI of 38 essential hypertension ankylosing spondylitis presents to the ED for evaluation of fever fatigue loss of appetite that began on 10/02/2020, in the ED he was found to be febrile satting 90 tachycardic and tachypneic, mild hyponatremia elevated procalcitonin CTA negative for PE.  Admitted for sepsis due to CAP.    PT Comments    Pt is making great progress towards his PT goals, ambulating an increased distance of up to ~150 ft while maintaining his HR </= 135 bpm. However, he fatigues quickly, needing x4 standing rest breaks during the gait bout. Pt also navigated x2 stairs with UE support with min guard assist. He displays poor feet clearance with gait and stairs, placing him at risk for falls. He tends to desire 1 UE support for confidence with gait but is capable of ambulating without UE support. Will continue to follow acutely. Current recommendations remain appropriate as he is very motivated to improve and is making great progress.    Recommendations for follow up therapy are one component of a multi-disciplinary discharge planning process, led by the attending physician.  Recommendations may be updated based on patient status, additional functional criteria and insurance authorization.  Follow Up Recommendations  No PT follow up     Equipment Recommendations  None recommended by PT    Recommendations for Other Services       Precautions / Restrictions Precautions Precautions: Fall Precaution Comments: watch HR Restrictions Weight Bearing Restrictions: No     Mobility  Bed Mobility Overal bed mobility: Modified Independent             General bed mobility comments: Pt able to transitionsupine > sit with HOB elevated  without assistance.    Transfers Overall transfer level: Needs assistance Equipment used: None Transfers: Sit to/from [facility] Sit to Stand: Supervision Stand pivot transfers: Supervision       General transfer comment: Supervision for safety, no LOB.  Ambulation/Gait Ambulation/Gait assistance: Min guard;Min assist Gait Distance (Feet): 150 Feet Assistive device: 1 person hand held assist;None Gait Pattern/deviations: Step-through pattern;Decreased stride length;Wide base of support;Decreased dorsiflexion - right Gait velocity: dec Gait velocity interpretation: <1.31 ft/sec, indicative of household ambulator General Gait Details: Pt ambulates with wide BOS, likely due to body habitus. In addition, his R leg is externally rotated with a tendency to drag his R foot, cued to improve clearance with bouts of success. Pt requesting for UE support, thus provided R HHA min guard-minA initially. During final ~20 ft, removed all UE support with pt ambulating without LOB. x4 standing rest breaks during gait bout   Stairs Stairs: Yes Stairs assistance: Min guard Stair Management: One rail Left;Forwards;Step to pattern Number of Stairs: 2 General stair comments: Ascends and descends with L rail and R HHA, min guard for safety, no LOB, but poor feet clearance with standard stair height.   Wheelchair Mobility    Modified Rankin (Stroke Patients Only)       Balance Overall balance assessment: Needs assistance   Sitting balance-Leahy Scale: Good     Standing balance support: No upper extremity supported;Single extremity supported;During functional activity Standing balance-Leahy Scale: Fair Standing balance comment: Able to ambulate without UE support but prefers 1 HHA.  Cognition Arousal/Alertness: Awake/alert Behavior During Therapy: WFL for tasks assessed/performed Overall Cognitive Status: Within Functional Limits for  tasks assessed                                        Exercises      General Comments General comments (skin integrity, edema, etc.): HR up to 135 bpmg with activity      Pertinent Vitals/Pain Pain Assessment: Faces Faces Pain Scale: Hurts a little bit Pain Location: legs with edema Pain Descriptors / Indicators: Discomfort Pain Intervention(s): Monitored during session;Limited activity within patient's tolerance;Repositioned    Home Living                      Prior Function            PT Goals (current goals can now be found in the care plan section) Acute Rehab PT Goals Patient Stated Goal: to go home PT Goal Formulation: With patient Time For Goal Achievement: 10/21/20 Potential to Achieve Goals: Good Progress towards PT goals: Progressing toward goals    Frequency    Min 3X/week      PT Plan Current plan remains appropriate    Co-evaluation              AM-PAC PT "6 Clicks" Mobility   Outcome Measure  Help needed turning from your back to your side while in a flat bed without using bedrails?: None Help needed moving from lying on your back to sitting on the side of a flat bed without using bedrails?: None Help needed moving to and from a bed to a chair (including a wheelchair)?: A Little Help needed standing up from a chair using your arms (e.g., wheelchair or bedside chair)?: A Little Help needed to walk in hospital room?: A Little Help needed climbing 3-5 steps with a railing? : A Little 6 Click Score: 20    End of Session   Activity Tolerance: Patient tolerated treatment well Patient left: with call bell/phone within reach;in chair   PT Visit Diagnosis: Other abnormalities of gait and mobility (R26.89);Muscle weakness (generalized) (M62.81);Unsteadiness on feet (R26.81);Difficulty in walking, not elsewhere classified (R26.2)     Time: 7824-2353 PT Time Calculation (min) (ACUTE ONLY): 16 min  Charges:  $Gait  Training: 8-22 mins                     Moishe Spice, PT, DPT Acute Rehabilitation Services  Pager: [phone] Office: [place] 10/14/2020, 3:50 PM

## 2020-10-14 NOTE — Progress Notes (Signed)
[facility]ious [facility]  Date of Admission:  10/06/2020           Reason for visit: Follow up on fevers  Current antibiotics: None   Previous antibiotics: Azithromycin x1 dose 9/8  Ceftriaxone x1 dose 9/8 Doxycycline 9/10-9/11 Vancomycin 9/8-9/9 Metronidazole 9/8-9/10; 9/12-9/13 Cefepime 9/8-9/15   ASSESSMENT:    55 y.o. male admitted with:  Fevers of unclear etiology with extensive work-up thus far not revealing of source.  He has had negative CTA of the chest, CT abdomen/pelvis, repeat abdominal and chest x-ray, TTE, lower extremity Dopplers, blood cultures x4, COVID PCR x2, respiratory viral panel, CMV antibodies, hepatitis panel, RPR, HIV, histoplasma urine antigen, mycoplasma IgM, Legionella urine antigen.  Negative autoimmune labs include antidouble-stranded DNA, rheumatoid factor, CK, ACE level.  ANA, cryoglobulins, Fungitell, AFB blood cultures, Aspergillus serum antigen are pending.  SPEP abnormality was discussed with hematology who suggested the possibility of MGUS and have added on further labs but state MGUS would be unusual to cause fevers.  Further labs included elevated procalcitonin, sed rate, CRP, and ferritin.  Consideration of HL H and soluble IL-2 has been ordered.  Also with hyponatremia, fevers, and fatigue will evaluate for adrenal insufficiency with a.m. cortisol and also check TSH. Ulcerative colitis and history of ankylosing spondylitis currently on Humira x5 years (followed by Dr. Trudie Reed as an outpatient for rheumatology) Hyponatremia Low back pain with inability to tolerate MRI.  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   Principal Problem:   Sepsis ([place]) Active Problems:   Essential hypertension   Inflammatory bowel disease   Hyponatremia   Bilateral pneumonia    MEDICATIONS:    Scheduled Meds:  allopurinol  300 mg Oral Daily   amLODipine  10 mg Oral Daily   enoxaparin (LOVENOX) injection  40 mg Subcutaneous Daily   furosemide  40 mg Intravenous Daily   nystatin  5 mL Oral QID   Continuous Infusions:  sodium chloride 10 mL/hr at 10/09/20 0636   PRN Meds:.sodium chloride, acetaminophen, alum & mag hydroxide-simeth, ondansetron **OR** ondansetron (ZOFRAN) IV, oxyCODONE, zolpidem  SUBJECTIVE:   24 hour events:  Continues to be febrile, T-max 101.9. Further labs added to evaluate abnormal SPEP.  ?  Possibility of MGUS although they state unlikely to cause fevers.  Procalcitonin remains stable this morning Sodium 120 WBC 17 CT lumbar spine pending Now off antibiotics  Patient seen this morning.  He reports increasing frustration at his length of hospital stay and how it may be impacting his future work.  He feels overall fine today.  Back pain is stable.  He thinks his fevers are attributable to the temperature in his room with the sun shining on him.  He denies any respiratory complaints, neck pain, headaches, vision changes.  The swelling in his legs is a little bit better but not back to baseline.  Review of Systems  All other systems reviewed and are negative.    OBJECTIVE:  Blood pressure 111/62, pulse (!) 104, temperature 98.5 F (36.9 C), temperature source Oral, resp. rate 19, height 5' 8"  (1.727 m), weight 116.9 kg, SpO2 98 %. Body mass index is 39.18 kg/m.  Physical Exam Constitutional:      General:  He is not in acute distress.    Appearance: Normal appearance.  HENT:     Head: Normocephalic and atraumatic.  Eyes:     Extraocular Movements: Extraocular movements intact.     Conjunctiva/sclera: Conjunctivae normal.  Pulmonary:     Effort: Pulmonary effort is normal. No respiratory distress.  Abdominal:     General: There is no distension.     Palpations: Abdomen is soft.     Tenderness: There is no abdominal tenderness.  Musculoskeletal:     Right lower leg: No edema.  Skin:    Comments: Bilateral legs with changes c/w stasis dermatitis.  Neurological:     General: No focal deficit present.     Mental Status: He is alert and oriented to person, place, and time.  Psychiatric:        Mood and Affect: Mood normal.        Behavior: Behavior normal.     Lab Results: Lab Results  Component Value Date   WBC 16.9 (H) 10/14/2020   HGB 11.8 (L) 10/14/2020   HCT 32.9 (L) 10/14/2020   MCV 92.9 10/14/2020   PLT 348 10/14/2020    Lab Results  Component Value Date   NA 120 (L) 10/14/2020   K 3.9 10/14/2020   CO2 25 10/14/2020   GLUCOSE 109 (H) 10/14/2020   BUN 17 10/14/2020   CREATININE 1.05 10/14/2020   CALCIUM 8.3 (L) 10/14/2020   GFRNONAA >60 10/14/2020   GFRAA >60 08/18/2010    Lab Results  Component Value Date   ALT 27 10/12/2020   AST 43 (H) 10/12/2020   ALKPHOS 204 (H) 10/12/2020   BILITOT 1.3 (H) 10/12/2020       Component Value Date/Time   CRP 23.0 (H) 10/12/2020 1049       Component Value Date/Time   ESRSEDRATE 85 (H) 10/12/2020 1049     I have reviewed the micro and lab results in Epic.  Imaging: DG Chest Port 1 View  Result Date: 10/12/2020 CLINICAL DATA:  Fever, shortness of breath, and abdominal distension. EXAM: PORTABLE CHEST 1 VIEW COMPARISON:  Chest x-ray and CTA chest dated October 06, 2020. FINDINGS: The heart size and mediastinal contours are within normal limits. Both lungs are clear. The visualized skeletal structures are  unremarkable. IMPRESSION: No active disease. Electronically Signed   By: Titus Dubin M.D.   On: 10/12/2020 17:56   DG Abd Portable 1V  Result Date: 10/12/2020 CLINICAL DATA:  Fever, shortness of breath, and abdominal distension. EXAM: PORTABLE ABDOMEN - 1 VIEW COMPARISON:  CT abdomen pelvis dated October 10, 2020. Abdominal x-ray dated August 17, 2010. FINDINGS: The bowel gas pattern is normal. Unchanged mild gaseous distension of the transverse colon. No radio-opaque calculi or other significant radiographic abnormality are seen. IMPRESSION: 1. Negative. Electronically Signed   By: Titus Dubin M.D.   On: 10/12/2020 17:57     Imaging independently reviewed in Epic.    Raynelle [facility] [phone] pager 10/14/2020, 10:06 AM  I spent greater than 35 minutes with the patient including greater than 50% of time in face to face counsel of the patient and in coordination of their care.

## 2020-10-14 NOTE — Progress Notes (Addendum)
PROGRESS NOTE  Austin Chang OAC:[id number] DOB: 1965-12-18 DOA: 10/06/2020 PCP: Josetta Huddle, MD  HPI/Recap of past 50 hours: 55 year old male with medical history significant for ulcerative colitis on Humira, hypertension, ankylosing spondylitis, presents to the ED for evaluation of fever of unknown origin that began on 10/02/2020.  So far extensive work-up has been unremarkable.  ID on board.   Today, pt denies any new complaints. Still with temp spike.   Assessment/Plan: Principal Problem:   Sepsis (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 Inflammatory markers, procalcitonin all elevated Repeat BC X2 NGTD CT abdomen/pelvis, chest unremarkable ID on board, recommend back imaging due to pain, unable to tolerate MRI even with sedation CT lumbar spine with no evidence for infection Discussed with Dr Burr Medico oncology on 10/13/20, about abnormal SPEP lab, stated it may be ??MGUS (unlikely to cause fever), further labs ordered to further evaluate Doppler LE r/o DVT Completed 7 days of antibiotics, stopped as per ID Nystatin swish and swallow   Hypervolemic hyponatremia Appears vol overloaded Discussed with Dr Candiss Norse nephrology, agree to increase lasix dose for adequate diuresis Stop IVFs, fluid restrict, increase IV lasix Serum osmolality 264, urine osmolality 491, urine sodium <10 Daily BMP  Hx of diastolic HF ECHO showed EF of 65-70%, no RWMA, grade 1DD Appears overloaded CXR unremarkable Continue IV lasix  Ulcerative colitis Resume Humira as an outpatient.  Essential hypertension Continue to hold antihypertensive medication  Obesity Lifestyle modification advised    Estimated body mass index is 39.18 kg/m as calculated from the following:   Height as of this encounter: 5' 8"   (1.727 m).   Weight as of this encounter: 116.9 kg.     Code Status: Full  Family Communication: None at bedside  Disposition Plan: Status is: Inpatient  Remains inpatient appropriate because:Inpatient level of care appropriate due to severity of illness  Dispo: The patient is from: Home              Anticipated d/c is to: Home              Patient currently is not medically stable to d/c.   Difficult to place patient No    Consultants: ID  Procedures: None  Antimicrobials: Completed cefepime  DVT prophylaxis: Lovenox   Objective: Vitals:   10/14/20 0453 10/14/20 0845 10/14/20 1041 10/14/20 1641  BP: (!) 141/73 111/62 (!) 115/48 123/70  Pulse: (!) 119 (!) 104 (!) 111 (!) 109  Resp: 19     Temp: (!) 100.9 F (38.3 C) 98.5 F (36.9 C) 98.6 F (37 C) 98.6 F (37 C)  TempSrc: Oral Oral Oral Oral  SpO2: 98%     Weight: 116.9 kg     Height:        Intake/Output Summary (Last 24 hours) at 10/14/2020 1703 Last data filed at 10/14/2020 1201 Gross per 24 hour  Intake 840 ml  Output 2250 ml  Net -1410 ml   Filed Weights   10/12/20 0500 10/13/20 0432 10/14/20 0453  Weight: 119 kg 116.9 kg 116.9 kg    Exam: General: NAD, ill appearing   Cardiovascular: S1, S2 present Respiratory: CTAB Abdomen: Soft, nontender, +distended, bowel sounds present Musculoskeletal: +++ bilateral pedal edema noted Skin: Normal Psychiatry: Normal mood     Data Reviewed: CBC: Recent Labs  Lab 10/08/20  2831 10/09/20 0104 10/09/20 0954 10/12/20 0849 10/13/20 0334 10/14/20 0158  WBC 11.8* 12.2* 12.4* 13.9* 14.7* 16.9*  NEUTROABS 9.2*  --  10.7* 12.4* 13.0* 14.4*  HGB 13.6 12.5* 12.2* 11.5* 11.5* 11.8*  HCT 38.5* 34.8* 35.6* 32.7* 32.4* 32.9*  MCV 94.1 93.5 94.7 93.4 92.3 92.9  PLT 174 196 220 297 303 517   Basic Metabolic Panel: Recent Labs  Lab 10/09/20 0104 10/11/20 0254 10/12/20 0849 10/13/20 0334 10/14/20 0158  NA 123* 118* 122* 121* 120*  K 4.0 4.0 4.5 3.6  3.9  CL 92* 88* 90* 89* 83*  CO2 22 22 23 24 25   GLUCOSE 104* 118* 90 110* 109*  BUN 19 16 16 16 17   CREATININE 1.14 0.93 0.99 0.98 1.05  CALCIUM 8.6* 8.2* 8.0* 7.9* 8.3*   GFR: Estimated Creatinine Clearance: 96.4 mL/min (by C-G formula based on SCr of 1.05 mg/dL). Liver Function Tests: Recent Labs  Lab 10/12/20 1049  AST 43*  ALT 27  ALKPHOS 204*  BILITOT 1.3*  PROT 5.4*  ALBUMIN 2.1*   No results for input(s): LIPASE, AMYLASE in the last 168 hours. No results for input(s): AMMONIA in the last 168 hours. Coagulation Profile: No results for input(s): INR, PROTIME in the last 168 hours. Cardiac Enzymes: Recent Labs  Lab 10/09/20 1424  CKTOTAL 55   BNP (last 3 results) No results for input(s): PROBNP in the last 8760 hours. HbA1C: No results for input(s): HGBA1C in the last 72 hours. CBG: No results for input(s): GLUCAP in the last 168 hours. Lipid Profile: Recent Labs    10/12/20 1049  TRIG 171*   Thyroid Function Tests: Recent Labs    10/14/20 0803  TSH 2.400   Anemia Panel: Recent Labs    10/12/20 1049  FERRITIN 1,403*   Urine analysis:    Component Value Date/Time   COLORURINE AMBER (A) 10/06/2020 1408   APPEARANCEUR CLEAR 10/06/2020 1408   LABSPEC 1.010 10/06/2020 1408   PHURINE 6.0 10/06/2020 1408   GLUCOSEU NEGATIVE 10/06/2020 1408   HGBUR MODERATE (A) 10/06/2020 1408   BILIRUBINUR NEGATIVE 10/06/2020 1408   KETONESUR NEGATIVE 10/06/2020 1408   PROTEINUR 30 (A) 10/06/2020 1408   NITRITE NEGATIVE 10/06/2020 1408   LEUKOCYTESUR NEGATIVE 10/06/2020 1408   Sepsis Labs: @LABRCNTIP (procalcitonin:4,lacticidven:4)  ) Recent Results (from the past 240 hour(s))  Resp Panel by RT-PCR (Flu A&B, Covid) Nasopharyngeal Swab     Status: None   Collection Time: 10/06/20  2:08 PM   Specimen: Nasopharyngeal Swab; Nasopharyngeal(NP) swabs in vial transport medium  Result Value Ref Range Status   SARS Coronavirus 2 by RT PCR NEGATIVE NEGATIVE Final     Comment: (NOTE) SARS-CoV-2 target nucleic acids are NOT DETECTED.  The SARS-CoV-2 RNA is generally detectable in upper respiratory specimens during the acute phase of infection. The lowest concentration of SARS-CoV-2 viral copies this assay can detect is 138 copies/mL. A negative result does not preclude SARS-Cov-2 infection and should not be used as the sole basis for treatment or other patient management decisions. A negative result may occur with  improper specimen collection/handling, submission of specimen other than nasopharyngeal swab, presence of viral mutation(s) within the areas targeted by this assay, and inadequate number of viral copies(<138 copies/mL). A negative result must be combined with clinical observations, patient history, and epidemiological information. The expected result is Negative.  Fact Sheet for Patients:  EntrepreneurPulse.com.au  Fact Sheet for Healthcare Providers:  IncredibleEmployment.be  This test is no t yet approved or cleared by  the [employer] and  has been authorized for detection and/or diagnosis of SARS-CoV-2 by FDA under an Emergency Use Authorization (EUA). This EUA will remain  in effect (meaning this test can be used) for the duration of the COVID-19 declaration under Section 564(b)(1) of the Act, 21 U.S.C.section 360bbb-3(b)(1), unless the authorization is terminated  or revoked sooner.       Influenza A by PCR NEGATIVE NEGATIVE Final   Influenza B by PCR NEGATIVE NEGATIVE Final    Comment: (NOTE) The Xpert Xpress SARS-CoV-2/FLU/RSV plus assay is intended as an aid in the diagnosis of influenza from Nasopharyngeal swab specimens and should not be used as a sole basis for treatment. Nasal washings and aspirates are unacceptable for Xpert Xpress SARS-CoV-2/FLU/RSV testing.  Fact Sheet for Patients: EntrepreneurPulse.com.au  Fact Sheet for Healthcare  Providers: IncredibleEmployment.be  This test is not yet approved or cleared by the [place] FDA and has been authorized for detection and/or diagnosis of SARS-CoV-2 by FDA under an Emergency Use Authorization (EUA). This EUA will remain in effect (meaning this test can be used) for the duration of the COVID-19 declaration under Section 564(b)(1) of the Act, 21 U.S.C. section 360bbb-3(b)(1), unless the authorization is terminated or revoked.  Performed at [facility], [address] [place], [place] [address]   Blood culture (routine x 2)     Status: None   Collection Time: 10/06/20  7:10 PM   Specimen: Right Antecubital; Blood  Result Value Ref Range Status   Specimen Description   Final    RIGHT ANTECUBITAL Performed at [facility], [address] [place], [place] [address]    Special Requests   Final    BOTTLES DRAWN AEROBIC AND ANAEROBIC Blood Culture results may not be optimal due to an inadequate volume of blood received in culture bottles Performed at [facility], [address] [place], [place] [address]    Culture   Final    NO GROWTH 5 DAYS Performed at [facility], [place] [address], [place], [place] [address]    Report Status 10/11/2020 FINAL  Final  Blood culture (routine x 2)     Status: None   Collection Time: 10/06/20  7:24 PM   Specimen: BLOOD RIGHT WRIST  Result Value Ref Range Status   Specimen Description   Final    BLOOD RIGHT WRIST Performed at [facility], [address] [place], [place] [address]    Special Requests   Final    BOTTLES DRAWN AEROBIC AND ANAEROBIC Blood Culture adequate volume Performed at [facility], [address] [place], [place] [address]    Culture   Final    NO GROWTH 5 DAYS Performed at [facility], [place] [address], [place], [place] [address]    Report Status 10/11/2020 FINAL  Final  Respiratory (~20 pathogens) panel by PCR      Status: None   Collection Time: 10/06/20  7:51 PM   Specimen: Nasopharyngeal Swab; Respiratory  Result Value Ref Range Status   Adenovirus NOT DETECTED NOT DETECTED Final   Coronavirus 229E NOT DETECTED NOT DETECTED Final    Comment: (NOTE) The Coronavirus on the Respiratory Panel, DOES NOT test for the novel  Coronavirus (2019 nCoV)    Coronavirus HKU1 NOT DETECTED NOT DETECTED Final   Coronavirus NL63 NOT DETECTED NOT DETECTED Final   Coronavirus OC43 NOT DETECTED NOT DETECTED Final  Metapneumovirus NOT DETECTED NOT DETECTED Final   Rhinovirus / Enterovirus NOT DETECTED NOT DETECTED Final   Influenza A NOT DETECTED NOT DETECTED Final   Influenza B NOT DETECTED NOT DETECTED Final   Parainfluenza Virus 1 NOT DETECTED NOT DETECTED Final   Parainfluenza Virus 2 NOT DETECTED NOT DETECTED Final   Parainfluenza Virus 3 NOT DETECTED NOT DETECTED Final   Parainfluenza Virus 4 NOT DETECTED NOT DETECTED Final   Respiratory Syncytial Virus NOT DETECTED NOT DETECTED Final   Bordetella pertussis NOT DETECTED NOT DETECTED Final   Bordetella Parapertussis NOT DETECTED NOT DETECTED Final   Chlamydophila pneumoniae NOT DETECTED NOT DETECTED Final   Mycoplasma pneumoniae NOT DETECTED NOT DETECTED Final    Comment: Performed at [facility], [place] [address], [place], [place] [address]  Resp Panel by RT-PCR (Flu A&B, Covid) Nasopharyngeal Swab     Status: None   Collection Time: 10/06/20  7:51 PM   Specimen: Nasopharyngeal Swab; Nasopharyngeal(NP) swabs in vial transport medium  Result Value Ref Range Status   SARS Coronavirus 2 by RT PCR NEGATIVE NEGATIVE Final    Comment: (NOTE) SARS-CoV-2 target nucleic acids are NOT DETECTED.  The SARS-CoV-2 RNA is generally detectable in upper respiratory specimens during the acute phase of infection. The lowest concentration of SARS-CoV-2 viral copies this assay can detect is 138 copies/mL. A negative result does not preclude SARS-Cov-2 infection and  should not be used as the sole basis for treatment or other patient management decisions. A negative result may occur with  improper specimen collection/handling, submission of specimen other than nasopharyngeal swab, presence of viral mutation(s) within the areas targeted by this assay, and inadequate number of viral copies(<138 copies/mL). A negative result must be combined with clinical observations, patient history, and epidemiological information. The expected result is Negative.  Fact Sheet for Patients:  EntrepreneurPulse.com.au  Fact Sheet for Healthcare Providers:  IncredibleEmployment.be  This test is no t yet approved or cleared by the [place] FDA and  has been authorized for detection and/or diagnosis of SARS-CoV-2 by FDA under an Emergency Use Authorization (EUA). This EUA will remain  in effect (meaning this test can be used) for the duration of the COVID-19 declaration under Section 564(b)(1) of the Act, 21 U.S.C.section 360bbb-3(b)(1), unless the authorization is terminated  or revoked sooner.       Influenza A by PCR NEGATIVE NEGATIVE Final   Influenza B by PCR NEGATIVE NEGATIVE Final    Comment: (NOTE) The Xpert Xpress SARS-CoV-2/FLU/RSV plus assay is intended as an aid in the diagnosis of influenza from Nasopharyngeal swab specimens and should not be used as a sole basis for treatment. Nasal washings and aspirates are unacceptable for Xpert Xpress SARS-CoV-2/FLU/RSV testing.  Fact Sheet for Patients: EntrepreneurPulse.com.au  Fact Sheet for Healthcare Providers: IncredibleEmployment.be  This test is not yet approved or cleared by the [place] FDA and has been authorized for detection and/or diagnosis of SARS-CoV-2 by FDA under an Emergency Use Authorization (EUA). This EUA will remain in effect (meaning this test can be used) for the duration of the COVID-19 declaration  under Section 564(b)(1) of the Act, 21 U.S.C. section 360bbb-3(b)(1), unless the authorization is terminated or revoked.  Performed at [facility], [address] [place], [place] [address]   MRSA Next Gen by PCR, Nasal     Status: None   Collection Time: 10/07/20 11:53 AM   Specimen: Nasal Mucosa; Nasal Swab  Result Value Ref Range Status  MRSA by PCR Next Gen NOT DETECTED NOT DETECTED Final    Comment: (NOTE) The GeneXpert MRSA Assay (FDA approved for NASAL specimens only), is one component of a comprehensive MRSA colonization surveillance program. It is not intended to diagnose MRSA infection nor to guide or monitor treatment for MRSA infections. Test performance is not FDA approved in patients less than 78 years old. Performed at [facility], [place] [address] [place], [place] [address]   Culture, blood (routine x 2)     Status: None (Preliminary result)   Collection Time: 10/12/20  8:49 AM   Specimen: BLOOD RIGHT ARM  Result Value Ref Range Status   Specimen Description BLOOD RIGHT ARM  Final   Special Requests   Final    BOTTLES DRAWN AEROBIC AND ANAEROBIC Blood Culture adequate volume   Culture   Final    NO GROWTH 2 DAYS Performed at [facility], [address] [place], [place] [address]    Report Status PENDING  Incomplete  Culture, blood (routine x 2)     Status: None (Preliminary result)   Collection Time: 10/12/20  9:03 AM   Specimen: BLOOD RIGHT HAND  Result Value Ref Range Status   Specimen Description BLOOD RIGHT HAND  Final   Special Requests   Final    BOTTLES DRAWN AEROBIC AND ANAEROBIC Blood Culture adequate volume   Culture   Final    NO GROWTH 2 DAYS Performed at [facility], [place] [address], [place], [place] [address]    Report Status PENDING  Incomplete      Studies: CT LUMBAR SPINE W CONTRAST  Result Date: 10/14/2020 CLINICAL DATA:  Low back pain. Infection suspected. Fever of unknown origin. EXAM: CT LUMBAR  SPINE WITH CONTRAST TECHNIQUE: Multidetector CT imaging of the lumbar spine was performed with intravenous contrast administration. CONTRAST:  61m OMNIPAQUE IOHEXOL 350 MG/ML SOLN COMPARISON:  CT abdomen pelvis 10/10/2020 FINDINGS: Segmentation: 5 non rib-bearing lumbar type vertebral bodies are present. The lowest fully formed vertebral body is L5. Alignment: No significant listhesis is present. Ankylosis lumbar spine extends from the highest visualized segment of T9-10 through L5. Mild leftward curvature is centered at L2-3 Vertebrae: Vertebral body heights are maintained. No erosions or focal osseous lesions present. Paraspinal and other soft tissues: Minimal atherosclerotic calcifications are present. No solid organ lesions are present. Paraspinous soft tissues are within normal limits. Disc levels: Is thoracolumbar spine is fused T10 through L5. No significant stenosis is present at L3-4 or above. L4-5: Asymmetric left-sided facet hypertrophy results in mild left foraminal narrowing. Central canal is patent. There is no fusion at L5-S1. Advanced facet hypertrophy is noted. Moderate foraminal narrowing is worse left than right. Vacuum disc phenomenon is present. There is some encroachment on the central canal is well. The SI joints are fused bilaterally. IMPRESSION: 1. Ankylosing spondylitis with of the thoracolumbar spine extends from the highest visualized segment of T9-10 through L5. 2. Moderate foraminal narrowing bilaterally at L5-S1 is worse left than right secondary to asymmetric facet hypertrophy without fusion at the L5-S1 level. This may be the source of patient's pain. No osseous erosion or evidence for infection. 3. Mild left foraminal narrowing at L4-5. 4. Fusion of the SI joints bilaterally. Electronically Signed   By: CSan MorelleM.D.   On: 10/14/2020 13:40    Scheduled Meds:  allopurinol  300 mg Oral Daily   amLODipine  10 mg Oral Daily   enoxaparin (LOVENOX) injection  40  mg  Subcutaneous Daily   furosemide  60 mg Intravenous BID   nystatin  5 mL Oral QID    Continuous Infusions:  sodium chloride 10 mL/hr at 10/09/20 0636     LOS: 8 days     Alma Friendly, MD [facility]  If 7PM-7AM, please contact night-coverage [email or web address] 10/14/2020, 5:03 PM

## 2020-10-15 DIAGNOSIS — I10 Essential (primary) hypertension: Secondary | ICD-10-CM | POA: Diagnosis not present

## 2020-10-15 DIAGNOSIS — K529 Noninfective gastroenteritis and colitis, unspecified: Secondary | ICD-10-CM | POA: Diagnosis not present

## 2020-10-15 DIAGNOSIS — R509 Fever, unspecified: Secondary | ICD-10-CM | POA: Diagnosis not present

## 2020-10-15 DIAGNOSIS — E871 Hypo-osmolality and hyponatremia: Secondary | ICD-10-CM | POA: Diagnosis not present

## 2020-10-15 LAB — CBC WITH DIFFERENTIAL/PLATELET
Abs Immature Granulocytes: 0.13 10*3/uL — ABNORMAL HIGH (ref 0.00–0.07)
Basophils Absolute: 0 10*3/uL (ref 0.0–0.1)
Basophils Relative: 0 %
Eosinophils Absolute: 0.1 10*3/uL (ref 0.0–0.5)
Eosinophils Relative: 1 %
HCT: 30.7 % — ABNORMAL LOW (ref 39.0–52.0)
Hemoglobin: 11.1 g/dL — ABNORMAL LOW (ref 13.0–17.0)
Immature Granulocytes: 1 %
Lymphocytes Relative: 8 %
Lymphs Abs: 1.1 10*3/uL (ref 0.7–4.0)
MCH: 33.2 pg (ref 26.0–34.0)
MCHC: 36.2 g/dL — ABNORMAL HIGH (ref 30.0–36.0)
MCV: 91.9 fL (ref 80.0–100.0)
Monocytes Absolute: 0.6 10*3/uL (ref 0.1–1.0)
Monocytes Relative: 4 %
Neutro Abs: 12 10*3/uL — ABNORMAL HIGH (ref 1.7–7.7)
Neutrophils Relative %: 86 %
Platelets: 381 10*3/uL (ref 150–400)
RBC: 3.34 MIL/uL — ABNORMAL LOW (ref 4.22–5.81)
RDW: 12.3 % (ref 11.5–15.5)
WBC: 14 10*3/uL — ABNORMAL HIGH (ref 4.0–10.5)
nRBC: 0 % (ref 0.0–0.2)

## 2020-10-15 LAB — BASIC METABOLIC PANEL
Anion gap: 10 (ref 5–15)
BUN: 16 mg/dL (ref 6–20)
CO2: 29 mmol/L (ref 22–32)
Calcium: 8 mg/dL — ABNORMAL LOW (ref 8.9–10.3)
Chloride: 85 mmol/L — ABNORMAL LOW (ref 98–111)
Creatinine, Ser: 1.01 mg/dL (ref 0.61–1.24)
GFR, Estimated: >60 mL/min (ref >60)
Glucose, Bld: 122 mg/dL — ABNORMAL HIGH (ref 70–99)
Potassium: 3.4 mmol/L — ABNORMAL LOW (ref 3.5–5.1)
Sodium: 124 mmol/L — ABNORMAL LOW (ref 135–145)

## 2020-10-15 LAB — FUNGITELL, SERUM: Fungitell Result: 63 pg/mL (ref <80)

## 2020-10-15 LAB — MAGNESIUM: Magnesium: 2 mg/dL (ref 1.7–2.4)

## 2020-10-15 MED ORDER — POTASSIUM CHLORIDE CRYS ER 20 MEQ PO TBCR
40.0000 meq | EXTENDED_RELEASE_TABLET | Freq: Every day | ORAL | Status: DC
  Administered 2020-10-15 – 2020-10-16 (×2): 40 meq via ORAL
  Filled 2020-10-15 (×3): qty 2

## 2020-10-15 NOTE — Plan of Care (Signed)
  Problem: Nutrition: Goal: Adequate nutrition will be maintained Outcome: Progressing   Problem: Coping: Goal: Level of anxiety will decrease Outcome: Progressing   

## 2020-10-15 NOTE — Progress Notes (Signed)
PROGRESS NOTE  Austin Chang:[id number] DOB: 11/04/1965 DOA: 10/06/2020 PCP: Josetta Huddle, MD  HPI/Recap of past 65 hours: 55 year old male with medical history significant for ulcerative colitis on Humira, hypertension, ankylosing spondylitis, presents to the ED for evaluation of fever of unknown origin that began on 10/02/2020.  So far extensive work-up has been unremarkable.  ID on board.   No fever >24H, denies any new complaints. Still with 3+ edema.   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 Inflammatory markers, procalcitonin all elevated Repeat BC X2 NGTD CT abdomen/pelvis, chest unremarkable ID on board, recommend back imaging due to pain, unable to tolerate MRI even with sedation CT lumbar spine with no evidence for infection Discussed with Dr Burr Medico oncology on 10/13/20, about abnormal SPEP lab, stated it may be ??MGUS (unlikely to cause fever), further labs ordered to further evaluate Doppler LE r/o DVT Completed 7 days of antibiotics, stopped as per ID Nystatin swish and swallow   Hypervolemic hyponatremia Appears vol overloaded Discussed with Dr Candiss Norse nephrology, agree to increase lasix dose for adequate diuresis Stop IVFs, fluid restrict, increase IV lasix Serum osmolality 264, urine osmolality 491, urine sodium <10 Daily BMP  Hx of diastolic HF ECHO showed EF of 65-70%, no RWMA, grade 1DD Appears overloaded CXR unremarkable Continue IV lasix  Ulcerative colitis Resume Humira as an outpatient.  Essential hypertension Continue to hold antihypertensive medication  Obesity Lifestyle modification advised    Estimated body mass index is 38.09 kg/m as calculated from the following:   Height as of this encounter: 5' 8"   (1.727 m).   Weight as of this encounter: 113.6 kg.     Code Status: Full  Family Communication: None at bedside  Disposition Plan: Status is: Inpatient  Remains inpatient appropriate because:Inpatient level of care appropriate due to severity of illness  Dispo: The patient is from: Home              Anticipated d/c is to: Home              Patient currently is not medically stable to d/c.   Difficult to place patient No    Consultants: ID  Procedures: None  Antimicrobials: Completed cefepime  DVT prophylaxis: Lovenox   Objective: Vitals:   10/15/20 0422 10/15/20 0700 10/15/20 1218 10/15/20 1517  BP:  112/70 107/61 110/68  Pulse:  98    Resp:  18 18 18   Temp:  (!) 97.5 F (36.4 C) (!) 97.4 F (36.3 C) 97.6 F (36.4 C)  TempSrc:  Oral Oral Oral  SpO2:  97% 95% 98%  Weight: 113.6 kg     Height:        Intake/Output Summary (Last 24 hours) at 10/15/2020 1623 Last data filed at 10/15/2020 1200 Gross per 24 hour  Intake 628 ml  Output 2575 ml  Net -1947 ml   Filed Weights   10/13/20 0432 10/14/20 0453 10/15/20 0422  Weight: 116.9 kg 116.9 kg 113.6 kg    Exam: General: NAD Cardiovascular: S1, S2 present Respiratory: CTAB Abdomen: Soft, nontender, distended, bowel sounds present Musculoskeletal: +++ bilateral pedal edema noted Skin: Normal Psychiatry: Normal mood     Data Reviewed: CBC: Recent Labs  Lab 10/09/20 0954 10/12/20 0849 10/13/20 0334 10/14/20 0158 10/15/20 0336  WBC  12.4* 13.9* 14.7* 16.9* 14.0*  NEUTROABS 10.7* 12.4* 13.0* 14.4* 12.0*  HGB 12.2* 11.5* 11.5* 11.8* 11.1*  HCT 35.6* 32.7* 32.4* 32.9* 30.7*  MCV 94.7 93.4 92.3 92.9 91.9  PLT 220 297 303 348 287   Basic Metabolic Panel: Recent Labs  Lab 10/11/20 0254 10/12/20 0849 10/13/20 0334 10/14/20 0158 10/15/20 0336 10/15/20 0828  NA 118* 122* 121* 120* 124*  --   K 4.0 4.5 3.6 3.9 3.4*  --   CL 88* 90* 89* 83* 85*  --   CO2 22 23 24 25 29   --   GLUCOSE 118* 90  110* 109* 122*  --   BUN 16 16 16 17 16   --   CREATININE 0.93 0.99 0.98 1.05 1.01  --   CALCIUM 8.2* 8.0* 7.9* 8.3* 8.0*  --   MG  --   --   --   --   --  2.0   GFR: Estimated Creatinine Clearance: 98.7 mL/min (by C-G formula based on SCr of 1.01 mg/dL). Liver Function Tests: Recent Labs  Lab 10/12/20 1049  AST 43*  ALT 27  ALKPHOS 204*  BILITOT 1.3*  PROT 5.4*  ALBUMIN 2.1*   No results for input(s): LIPASE, AMYLASE in the last 168 hours. No results for input(s): AMMONIA in the last 168 hours. Coagulation Profile: No results for input(s): INR, PROTIME in the last 168 hours. Cardiac Enzymes: Recent Labs  Lab 10/09/20 1424  CKTOTAL 55   BNP (last 3 results) No results for input(s): PROBNP in the last 8760 hours. HbA1C: No results for input(s): HGBA1C in the last 72 hours. CBG: No results for input(s): GLUCAP in the last 168 hours. Lipid Profile: No results for input(s): CHOL, HDL, LDLCALC, TRIG, CHOLHDL, LDLDIRECT in the last 72 hours.  Thyroid Function Tests: Recent Labs    10/14/20 0803  TSH 2.400   Anemia Panel: No results for input(s): VITAMINB12, FOLATE, FERRITIN, TIBC, IRON, RETICCTPCT in the last 72 hours.  Urine analysis:    Component Value Date/Time   COLORURINE AMBER (A) 10/06/2020 1408   APPEARANCEUR CLEAR 10/06/2020 1408   LABSPEC 1.010 10/06/2020 1408   PHURINE 6.0 10/06/2020 1408   GLUCOSEU NEGATIVE 10/06/2020 1408   HGBUR MODERATE (A) 10/06/2020 1408   BILIRUBINUR NEGATIVE 10/06/2020 1408   KETONESUR NEGATIVE 10/06/2020 1408   PROTEINUR 30 (A) 10/06/2020 1408   NITRITE NEGATIVE 10/06/2020 1408   LEUKOCYTESUR NEGATIVE 10/06/2020 1408   Sepsis Labs: @LABRCNTIP (procalcitonin:4,lacticidven:4)  ) Recent Results (from the past 240 hour(s))  Resp Panel by RT-PCR (Flu A&B, Covid) Nasopharyngeal Swab     Status: None   Collection Time: 10/06/20  2:08 PM   Specimen: Nasopharyngeal Swab; Nasopharyngeal(NP) swabs in vial transport medium  Result  Value Ref Range Status   SARS Coronavirus 2 by RT PCR NEGATIVE NEGATIVE Final    Comment: (NOTE) SARS-CoV-2 target nucleic acids are NOT DETECTED.  The SARS-CoV-2 RNA is generally detectable in upper respiratory specimens during the acute phase of infection. The lowest concentration of SARS-CoV-2 viral copies this assay can detect is 138 copies/mL. A negative result does not preclude SARS-Cov-2 infection and should not be used as the sole basis for treatment or other patient management decisions. A negative result may occur with  improper specimen collection/handling, submission of specimen other than nasopharyngeal swab, presence of viral mutation(s) within the areas targeted by this assay, and inadequate number of viral copies(<138 copies/mL). A negative result must be combined with clinical observations, patient history, and epidemiological information.  The expected result is Negative.  Fact Sheet for Patients:  EntrepreneurPulse.com.au  Fact Sheet for Healthcare Providers:  IncredibleEmployment.be  This test is no t yet approved or cleared by the [place] FDA and  has been authorized for detection and/or diagnosis of SARS-CoV-2 by FDA under an Emergency Use Authorization (EUA). This EUA will remain  in effect (meaning this test can be used) for the duration of the COVID-19 declaration under Section 564(b)(1) of the Act, 21 U.S.C.section 360bbb-3(b)(1), unless the authorization is terminated  or revoked sooner.       Influenza A by PCR NEGATIVE NEGATIVE Final   Influenza B by PCR NEGATIVE NEGATIVE Final    Comment: (NOTE) The Xpert Xpress SARS-CoV-2/FLU/RSV plus assay is intended as an aid in the diagnosis of influenza from Nasopharyngeal swab specimens and should not be used as a sole basis for treatment. Nasal washings and aspirates are unacceptable for Xpert Xpress SARS-CoV-2/FLU/RSV testing.  Fact Sheet for  Patients: EntrepreneurPulse.com.au  Fact Sheet for Healthcare Providers: IncredibleEmployment.be  This test is not yet approved or cleared by the [place] FDA and has been authorized for detection and/or diagnosis of SARS-CoV-2 by FDA under an Emergency Use Authorization (EUA). This EUA will remain in effect (meaning this test can be used) for the duration of the COVID-19 declaration under Section 564(b)(1) of the Act, 21 U.S.C. section 360bbb-3(b)(1), unless the authorization is terminated or revoked.  Performed at [facility], [address] [place], [place] [address]   Blood culture (routine x 2)     Status: None   Collection Time: 10/06/20  7:10 PM   Specimen: Right Antecubital; Blood  Result Value Ref Range Status   Specimen Description   Final    RIGHT ANTECUBITAL Performed at [facility], [address] [place], [place] [address]    Special Requests   Final    BOTTLES DRAWN AEROBIC AND ANAEROBIC Blood Culture results may not be optimal due to an inadequate volume of blood received in culture bottles Performed at [facility], [address] [place], [place] [address]    Culture   Final    NO GROWTH 5 DAYS Performed at [facility], [place] [address], [place], [place] [address]    Report Status 10/11/2020 FINAL  Final  Blood culture (routine x 2)     Status: None   Collection Time: 10/06/20  7:24 PM   Specimen: BLOOD RIGHT WRIST  Result Value Ref Range Status   Specimen Description   Final    BLOOD RIGHT WRIST Performed at [facility], [address] [place], [place] [address]    Special Requests   Final    BOTTLES DRAWN AEROBIC AND ANAEROBIC Blood Culture adequate volume Performed at [facility], [address] [place], [place] [address]    Culture   Final    NO GROWTH 5 DAYS Performed at [facility], [place] [address], [place], [place] [address]     Report Status 10/11/2020 FINAL  Final  Respiratory (~20 pathogens) panel by PCR     Status: None   Collection Time: 10/06/20  7:51 PM   Specimen: Nasopharyngeal Swab; Respiratory  Result Value Ref Range Status   Adenovirus NOT DETECTED NOT DETECTED Final   Coronavirus 229E NOT DETECTED NOT DETECTED Final    Comment: (NOTE) The Coronavirus on the Respiratory Panel, DOES NOT test for the novel  Coronavirus (  2019 nCoV)    Coronavirus HKU1 NOT DETECTED NOT DETECTED Final   Coronavirus NL63 NOT DETECTED NOT DETECTED Final   Coronavirus OC43 NOT DETECTED NOT DETECTED Final   Metapneumovirus NOT DETECTED NOT DETECTED Final   Rhinovirus / Enterovirus NOT DETECTED NOT DETECTED Final   Influenza A NOT DETECTED NOT DETECTED Final   Influenza B NOT DETECTED NOT DETECTED Final   Parainfluenza Virus 1 NOT DETECTED NOT DETECTED Final   Parainfluenza Virus 2 NOT DETECTED NOT DETECTED Final   Parainfluenza Virus 3 NOT DETECTED NOT DETECTED Final   Parainfluenza Virus 4 NOT DETECTED NOT DETECTED Final   Respiratory Syncytial Virus NOT DETECTED NOT DETECTED Final   Bordetella pertussis NOT DETECTED NOT DETECTED Final   Bordetella Parapertussis NOT DETECTED NOT DETECTED Final   Chlamydophila pneumoniae NOT DETECTED NOT DETECTED Final   Mycoplasma pneumoniae NOT DETECTED NOT DETECTED Final    Comment: Performed at [facility], [place] [address] [place], [place] [address]  Resp Panel by RT-PCR (Flu A&B, Covid) Nasopharyngeal Swab     Status: None   Collection Time: 10/06/20  7:51 PM   Specimen: Nasopharyngeal Swab; Nasopharyngeal(NP) swabs in vial transport medium  Result Value Ref Range Status   SARS Coronavirus 2 by RT PCR NEGATIVE NEGATIVE Final    Comment: (NOTE) SARS-CoV-2 target nucleic acids are NOT DETECTED.  The SARS-CoV-2 RNA is generally detectable in upper respiratory specimens during the acute phase of infection. The lowest concentration of SARS-CoV-2 viral copies this assay can  detect is 138 copies/mL. A negative result does not preclude SARS-Cov-2 infection and should not be used as the sole basis for treatment or other patient management decisions. A negative result may occur with  improper specimen collection/handling, submission of specimen other than nasopharyngeal swab, presence of viral mutation(s) within the areas targeted by this assay, and inadequate number of viral copies(<138 copies/mL). A negative result must be combined with clinical observations, patient history, and epidemiological information. The expected result is Negative.  Fact Sheet for Patients:  EntrepreneurPulse.com.au  Fact Sheet for Healthcare Providers:  IncredibleEmployment.be  This test is no t yet approved or cleared by the [place] FDA and  has been authorized for detection and/or diagnosis of SARS-CoV-2 by FDA under an Emergency Use Authorization (EUA). This EUA will remain  in effect (meaning this test can be used) for the duration of the COVID-19 declaration under Section 564(b)(1) of the Act, 21 U.S.C.section 360bbb-3(b)(1), unless the authorization is terminated  or revoked sooner.       Influenza A by PCR NEGATIVE NEGATIVE Final   Influenza B by PCR NEGATIVE NEGATIVE Final    Comment: (NOTE) The Xpert Xpress SARS-CoV-2/FLU/RSV plus assay is intended as an aid in the diagnosis of influenza from Nasopharyngeal swab specimens and should not be used as a sole basis for treatment. Nasal washings and aspirates are unacceptable for Xpert Xpress SARS-CoV-2/FLU/RSV testing.  Fact Sheet for Patients: EntrepreneurPulse.com.au  Fact Sheet for Healthcare Providers: IncredibleEmployment.be  This test is not yet approved or cleared by the [place] FDA and has been authorized for detection and/or diagnosis of SARS-CoV-2 by FDA under an Emergency Use Authorization (EUA). This EUA will remain in  effect (meaning this test can be used) for the duration of the COVID-19 declaration under Section 564(b)(1) of the Act, 21 U.S.C. section 360bbb-3(b)(1), unless the authorization is terminated or revoked.  Performed at [facility], [address], [place], [place] [address]   MRSA Next  Gen by PCR, Nasal     Status: None   Collection Time: 10/07/20 11:53 AM   Specimen: Nasal Mucosa; Nasal Swab  Result Value Ref Range Status   MRSA by PCR Next Gen NOT DETECTED NOT DETECTED Final    Comment: (NOTE) The GeneXpert MRSA Assay (FDA approved for NASAL specimens only), is one component of a comprehensive MRSA colonization surveillance program. It is not intended to diagnose MRSA infection nor to guide or monitor treatment for MRSA infections. Test performance is not FDA approved in patients less than 50 years old. Performed at [facility], [place] [address] [place], [place] [address]   Culture, blood (routine x 2)     Status: None (Preliminary result)   Collection Time: 10/12/20  8:49 AM   Specimen: BLOOD RIGHT ARM  Result Value Ref Range Status   Specimen Description BLOOD RIGHT ARM  Final   Special Requests   Final    BOTTLES DRAWN AEROBIC AND ANAEROBIC Blood Culture adequate volume   Culture   Final    NO GROWTH 3 DAYS Performed at [facility], [address] [place], [place] [address]    Report Status PENDING  Incomplete  Culture, blood (routine x 2)     Status: None (Preliminary result)   Collection Time: 10/12/20  9:03 AM   Specimen: BLOOD RIGHT HAND  Result Value Ref Range Status   Specimen Description BLOOD RIGHT HAND  Final   Special Requests   Final    BOTTLES DRAWN AEROBIC AND ANAEROBIC Blood Culture adequate volume   Culture   Final    NO GROWTH 3 DAYS Performed at [facility], [place] [address] [place],  [address]    Report Status PENDING  Incomplete      Studies: No results found.  Scheduled Meds:  allopurinol  300 mg Oral  Daily   amLODipine  10 mg Oral Daily   enoxaparin (LOVENOX) injection  40 mg Subcutaneous Daily   furosemide  60 mg Intravenous BID   nystatin  5 mL Oral QID   potassium chloride  40 mEq Oral Daily    Continuous Infusions:  sodium chloride 10 mL/hr at 10/09/20 0636     LOS: 9 days     Alma Friendly, MD [facility]  If 7PM-7AM, please contact night-coverage [email or web address] 10/15/2020, 4:23 PM

## 2020-10-16 DIAGNOSIS — E871 Hypo-osmolality and hyponatremia: Secondary | ICD-10-CM | POA: Diagnosis not present

## 2020-10-16 DIAGNOSIS — R509 Fever, unspecified: Secondary | ICD-10-CM | POA: Diagnosis not present

## 2020-10-16 DIAGNOSIS — I10 Essential (primary) hypertension: Secondary | ICD-10-CM | POA: Diagnosis not present

## 2020-10-16 DIAGNOSIS — K529 Noninfective gastroenteritis and colitis, unspecified: Secondary | ICD-10-CM | POA: Diagnosis not present

## 2020-10-16 LAB — CBC WITH DIFFERENTIAL/PLATELET
Abs Immature Granulocytes: 0.14 10*3/uL — ABNORMAL HIGH (ref 0.00–0.07)
Basophils Absolute: 0 10*3/uL (ref 0.0–0.1)
Basophils Relative: 0 %
Eosinophils Absolute: 0.2 10*3/uL (ref 0.0–0.5)
Eosinophils Relative: 1 %
HCT: 29 % — ABNORMAL LOW (ref 39.0–52.0)
Hemoglobin: 10.1 g/dL — ABNORMAL LOW (ref 13.0–17.0)
Immature Granulocytes: 1 %
Lymphocytes Relative: 9 %
Lymphs Abs: 1.2 10*3/uL (ref 0.7–4.0)
MCH: 32.3 pg (ref 26.0–34.0)
MCHC: 34.8 g/dL (ref 30.0–36.0)
MCV: 92.7 fL (ref 80.0–100.0)
Monocytes Absolute: 0.7 10*3/uL (ref 0.1–1.0)
Monocytes Relative: 5 %
Neutro Abs: 11.7 10*3/uL — ABNORMAL HIGH (ref 1.7–7.7)
Neutrophils Relative %: 84 %
Platelets: 416 10*3/uL — ABNORMAL HIGH (ref 150–400)
RBC: 3.13 MIL/uL — ABNORMAL LOW (ref 4.22–5.81)
RDW: 12.4 % (ref 11.5–15.5)
WBC: 13.9 10*3/uL — ABNORMAL HIGH (ref 4.0–10.5)
nRBC: 0 % (ref 0.0–0.2)

## 2020-10-16 LAB — BASIC METABOLIC PANEL
Anion gap: 9 (ref 5–15)
BUN: 17 mg/dL (ref 6–20)
CO2: 30 mmol/L (ref 22–32)
Calcium: 8.1 mg/dL — ABNORMAL LOW (ref 8.9–10.3)
Chloride: 86 mmol/L — ABNORMAL LOW (ref 98–111)
Creatinine, Ser: 0.96 mg/dL (ref 0.61–1.24)
GFR, Estimated: >60 mL/min (ref >60)
Glucose, Bld: 126 mg/dL — ABNORMAL HIGH (ref 70–99)
Potassium: 3.2 mmol/L — ABNORMAL LOW (ref 3.5–5.1)
Sodium: 125 mmol/L — ABNORMAL LOW (ref 135–145)

## 2020-10-16 LAB — CRYOGLOBULIN

## 2020-10-16 MED ORDER — FUROSEMIDE 10 MG/ML IJ SOLN
80.0000 mg | Freq: Two times a day (BID) | INTRAMUSCULAR | Status: DC
  Administered 2020-10-16 – 2020-10-17 (×3): 80 mg via INTRAVENOUS
  Filled 2020-10-16 (×3): qty 8

## 2020-10-16 MED ORDER — POTASSIUM CHLORIDE CRYS ER 20 MEQ PO TBCR
40.0000 meq | EXTENDED_RELEASE_TABLET | Freq: Two times a day (BID) | ORAL | Status: DC
  Administered 2020-10-16 – 2020-10-17 (×2): 40 meq via ORAL
  Filled 2020-10-16 (×2): qty 2

## 2020-10-16 NOTE — Progress Notes (Signed)
PROGRESS NOTE  Austin Chang TZG:[id number] DOB: [birth year] DOA: 10/06/2020 PCP: Josetta Huddle, MD  HPI/Recap of past 51 hours: 55 year old male with medical history significant for ulcerative colitis on Humira, hypertension, ankylosing spondylitis, presents to the ED for evaluation of fever of unknown origin that began on 10/02/2020.  So far extensive work-up has been unremarkable.  ID on board.   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 Inflammatory markers, procalcitonin all elevated Repeat BC X2 NGTD CT abdomen/pelvis, chest unremarkable ID on board, recommend back imaging due to pain, unable to tolerate MRI even with sedation CT lumbar spine with no evidence for infection Discussed with Dr Burr Medico oncology on 10/13/20, about abnormal SPEP lab, stated it may be ??MGUS (unlikely to cause fever), further labs ordered to further evaluate Doppler LE r/o DVT Completed 7 days of antibiotics, stopped as per ID Nystatin swish and swallow   Hypervolemic hyponatremia Appears vol overloaded Discussed with Dr Candiss Norse nephrology, agree to increase lasix dose for adequate diuresis Stop IVFs, fluid restrict, increase IV lasix Serum osmolality 264, urine osmolality 491, urine sodium <10 Daily BMP  Hypokalemia Replace prn  HTN BP soft Held PTA amlodipine (could worsen edema), consider discontinue on discharge  Hx of diastolic HF ECHO showed EF of 65-70%, no RWMA, grade 1DD Appears overloaded CXR unremarkable Continue IV lasix  Ulcerative colitis Resume Humira as an outpatient.  Essential hypertension Continue to hold antihypertensive medication  Obesity Lifestyle modification  advised    Estimated body mass index is 37.72 kg/m as calculated from the following:   Height as of this encounter: 5' 8"  (1.727 m).   Weight as of this encounter: 112.5 kg.     Code Status: Full  Family Communication: None at bedside  Disposition Plan: Status is: Inpatient  Remains inpatient appropriate because:Inpatient level of care appropriate due to severity of illness  Dispo: The patient is from: Home              Anticipated d/c is to: Home              Patient currently is not medically stable to d/c.   Difficult to place patient No    Consultants: ID  Procedures: None  Antimicrobials: Completed cefepime  DVT prophylaxis: Lovenox   Objective: Vitals:   10/16/20 0117 10/16/20 0607 10/16/20 0805 10/16/20 1208  BP:  (!) 105/58 109/62 108/73  Pulse:  (!) 102    Resp:  16 16 16   Temp:  98.3 F (36.8 C) 97.6 F (36.4 C) 97.7 F (36.5 C)  TempSrc:  Oral Oral Oral  SpO2:  94% 99% 97%  Weight: 112.5 kg     Height:        Intake/Output Summary (Last 24 hours) at 10/16/2020 1358 Last data filed at 10/16/2020 1211 Gross per 24 hour  Intake 150 ml  Output 3100 ml  Net -2950 ml   Filed Weights   10/14/20 0453 10/15/20 0422 10/16/20 0117  Weight: 116.9 kg 113.6 kg 112.5 kg    Exam: General: NAD Cardiovascular: S1, S2 present Respiratory: CTAB Abdomen: Soft, nontender, distended, bowel sounds present Musculoskeletal: +++ bilateral pedal edema noted Skin: Normal Psychiatry: Normal mood     Data  Reviewed: CBC: Recent Labs  Lab 10/12/20 0849 10/13/20 0334 10/14/20 0158 10/15/20 0336 10/16/20 0347  WBC 13.9* 14.7* 16.9* 14.0* 13.9*  NEUTROABS 12.4* 13.0* 14.4* 12.0* 11.7*  HGB 11.5* 11.5* 11.8* 11.1* 10.1*  HCT 32.7* 32.4* 32.9* 30.7* 29.0*  MCV 93.4 92.3 92.9 91.9 92.7  PLT 297 303 348 381 665*   Basic Metabolic Panel: Recent Labs  Lab 10/12/20 0849 10/13/20 0334 10/14/20 0158 10/15/20 0336 10/15/20 0828 10/16/20 0347  NA 122*  121* 120* 124*  --  125*  K 4.5 3.6 3.9 3.4*  --  3.2*  CL 90* 89* 83* 85*  --  86*  CO2 23 24 25 29   --  30  GLUCOSE 90 110* 109* 122*  --  126*  BUN 16 16 17 16   --  17  CREATININE 0.99 0.98 1.05 1.01  --  0.96  CALCIUM 8.0* 7.9* 8.3* 8.0*  --  8.1*  MG  --   --   --   --  2.0  --    GFR: Estimated Creatinine Clearance: 103.3 mL/min (by C-G formula based on SCr of 0.96 mg/dL). Liver Function Tests: Recent Labs  Lab 10/12/20 1049  AST 43*  ALT 27  ALKPHOS 204*  BILITOT 1.3*  PROT 5.4*  ALBUMIN 2.1*   No results for input(s): LIPASE, AMYLASE in the last 168 hours. No results for input(s): AMMONIA in the last 168 hours. Coagulation Profile: No results for input(s): INR, PROTIME in the last 168 hours. Cardiac Enzymes: Recent Labs  Lab 10/09/20 1424  CKTOTAL 55   BNP (last 3 results) No results for input(s): PROBNP in the last 8760 hours. HbA1C: No results for input(s): HGBA1C in the last 72 hours. CBG: No results for input(s): GLUCAP in the last 168 hours. Lipid Profile: No results for input(s): CHOL, HDL, LDLCALC, TRIG, CHOLHDL, LDLDIRECT in the last 72 hours.  Thyroid Function Tests: Recent Labs    10/14/20 0803  TSH 2.400   Anemia Panel: No results for input(s): VITAMINB12, FOLATE, FERRITIN, TIBC, IRON, RETICCTPCT in the last 72 hours.  Urine analysis:    Component Value Date/Time   COLORURINE AMBER (A) 10/06/2020 1408   APPEARANCEUR CLEAR 10/06/2020 1408   LABSPEC 1.010 10/06/2020 1408   PHURINE 6.0 10/06/2020 1408   GLUCOSEU NEGATIVE 10/06/2020 1408   HGBUR MODERATE (A) 10/06/2020 1408   BILIRUBINUR NEGATIVE 10/06/2020 1408   KETONESUR NEGATIVE 10/06/2020 1408   PROTEINUR 30 (A) 10/06/2020 1408   NITRITE NEGATIVE 10/06/2020 1408   LEUKOCYTESUR NEGATIVE 10/06/2020 1408   Sepsis Labs: @LABRCNTIP (procalcitonin:4,lacticidven:4)  ) Recent Results (from the past 240 hour(s))  Resp Panel by RT-PCR (Flu A&B, Covid) Nasopharyngeal Swab     Status: None    Collection Time: 10/06/20  2:08 PM   Specimen: Nasopharyngeal Swab; Nasopharyngeal(NP) swabs in vial transport medium  Result Value Ref Range Status   SARS Coronavirus 2 by RT PCR NEGATIVE NEGATIVE Final    Comment: (NOTE) SARS-CoV-2 target nucleic acids are NOT DETECTED.  The SARS-CoV-2 RNA is generally detectable in upper respiratory specimens during the acute phase of infection. The lowest concentration of SARS-CoV-2 viral copies this assay can detect is 138 copies/mL. A negative result does not preclude SARS-Cov-2 infection and should not be used as the sole basis for treatment or other patient management decisions. A negative result may occur with  improper specimen collection/handling, submission of specimen other than nasopharyngeal swab, presence of viral mutation(s) within the areas targeted by this assay, and inadequate number  of viral copies(<138 copies/mL). A negative result must be combined with clinical observations, patient history, and epidemiological information. The expected result is Negative.  Fact Sheet for Patients:  EntrepreneurPulse.com.au  Fact Sheet for Healthcare Providers:  IncredibleEmployment.be  This test is no t yet approved or cleared by the [place] FDA and  has been authorized for detection and/or diagnosis of SARS-CoV-2 by FDA under an Emergency Use Authorization (EUA). This EUA will remain  in effect (meaning this test can be used) for the duration of the COVID-19 declaration under Section 564(b)(1) of the Act, 21 U.S.C.section 360bbb-3(b)(1), unless the authorization is terminated  or revoked sooner.       Influenza A by PCR NEGATIVE NEGATIVE Final   Influenza B by PCR NEGATIVE NEGATIVE Final    Comment: (NOTE) The Xpert Xpress SARS-CoV-2/FLU/RSV plus assay is intended as an aid in the diagnosis of influenza from Nasopharyngeal swab specimens and should not be used as a sole basis for treatment.  Nasal washings and aspirates are unacceptable for Xpert Xpress SARS-CoV-2/FLU/RSV testing.  Fact Sheet for Patients: EntrepreneurPulse.com.au  Fact Sheet for Healthcare Providers: IncredibleEmployment.be  This test is not yet approved or cleared by the [place] FDA and has been authorized for detection and/or diagnosis of SARS-CoV-2 by FDA under an Emergency Use Authorization (EUA). This EUA will remain in effect (meaning this test can be used) for the duration of the COVID-19 declaration under Section 564(b)(1) of the Act, 21 U.S.C. section 360bbb-3(b)(1), unless the authorization is terminated or revoked.  Performed at [facility], [address] [place], [place] [address]   Blood culture (routine x 2)     Status: None   Collection Time: 10/06/20  7:10 PM   Specimen: Right Antecubital; Blood  Result Value Ref Range Status   Specimen Description   Final    RIGHT ANTECUBITAL Performed at [facility], [address] [place], [place] [address]    Special Requests   Final    BOTTLES DRAWN AEROBIC AND ANAEROBIC Blood Culture results may not be optimal due to an inadequate volume of blood received in culture bottles Performed at [facility], [address] [place], [place] [address]    Culture   Final    NO GROWTH 5 DAYS Performed at [facility], [place] [address] [place], [place] [address]    Report Status 10/11/2020 FINAL  Final  Blood culture (routine x 2)     Status: None   Collection Time: 10/06/20  7:24 PM   Specimen: BLOOD RIGHT WRIST  Result Value Ref Range Status   Specimen Description   Final    BLOOD RIGHT WRIST Performed at [facility], [address] [place], [place] [address]    Special Requests   Final    BOTTLES DRAWN AEROBIC AND ANAEROBIC Blood Culture adequate volume Performed at [facility], [address] [place], [place] [address]    Culture    Final    NO GROWTH 5 DAYS Performed at [facility], [place] [address], [place], [place] [address]    Report Status 10/11/2020 FINAL  Final  Respiratory (~20 pathogens) panel by PCR     Status: None   Collection Time: 10/06/20  7:51 PM   Specimen: Nasopharyngeal Swab; Respiratory  Result Value Ref Range Status   Adenovirus NOT DETECTED NOT DETECTED Final   Coronavirus 229E NOT DETECTED NOT DETECTED Final  Comment: (NOTE) The Coronavirus on the Respiratory Panel, DOES NOT test for the novel  Coronavirus (2019 nCoV)    Coronavirus HKU1 NOT DETECTED NOT DETECTED Final   Coronavirus NL63 NOT DETECTED NOT DETECTED Final   Coronavirus OC43 NOT DETECTED NOT DETECTED Final   Metapneumovirus NOT DETECTED NOT DETECTED Final   Rhinovirus / Enterovirus NOT DETECTED NOT DETECTED Final   Influenza A NOT DETECTED NOT DETECTED Final   Influenza B NOT DETECTED NOT DETECTED Final   Parainfluenza Virus 1 NOT DETECTED NOT DETECTED Final   Parainfluenza Virus 2 NOT DETECTED NOT DETECTED Final   Parainfluenza Virus 3 NOT DETECTED NOT DETECTED Final   Parainfluenza Virus 4 NOT DETECTED NOT DETECTED Final   Respiratory Syncytial Virus NOT DETECTED NOT DETECTED Final   Bordetella pertussis NOT DETECTED NOT DETECTED Final   Bordetella Parapertussis NOT DETECTED NOT DETECTED Final   Chlamydophila pneumoniae NOT DETECTED NOT DETECTED Final   Mycoplasma pneumoniae NOT DETECTED NOT DETECTED Final    Comment: Performed at [facility], [place] [address] [place], [place] [address]  Resp Panel by RT-PCR (Flu A&B, Covid) Nasopharyngeal Swab     Status: None   Collection Time: 10/06/20  7:51 PM   Specimen: Nasopharyngeal Swab; Nasopharyngeal(NP) swabs in vial transport medium  Result Value Ref Range Status   SARS Coronavirus 2 by RT PCR NEGATIVE NEGATIVE Final    Comment: (NOTE) SARS-CoV-2 target nucleic acids are NOT DETECTED.  The SARS-CoV-2 RNA is generally detectable in upper respiratory specimens  during the acute phase of infection. The lowest concentration of SARS-CoV-2 viral copies this assay can detect is 138 copies/mL. A negative result does not preclude SARS-Cov-2 infection and should not be used as the sole basis for treatment or other patient management decisions. A negative result may occur with  improper specimen collection/handling, submission of specimen other than nasopharyngeal swab, presence of viral mutation(s) within the areas targeted by this assay, and inadequate number of viral copies(<138 copies/mL). A negative result must be combined with clinical observations, patient history, and epidemiological information. The expected result is Negative.  Fact Sheet for Patients:  EntrepreneurPulse.com.au  Fact Sheet for Healthcare Providers:  IncredibleEmployment.be  This test is no t yet approved or cleared by the [place] FDA and  has been authorized for detection and/or diagnosis of SARS-CoV-2 by FDA under an Emergency Use Authorization (EUA). This EUA will remain  in effect (meaning this test can be used) for the duration of the COVID-19 declaration under Section 564(b)(1) of the Act, 21 U.S.C.section 360bbb-3(b)(1), unless the authorization is terminated  or revoked sooner.       Influenza A by PCR NEGATIVE NEGATIVE Final   Influenza B by PCR NEGATIVE NEGATIVE Final    Comment: (NOTE) The Xpert Xpress SARS-CoV-2/FLU/RSV plus assay is intended as an aid in the diagnosis of influenza from Nasopharyngeal swab specimens and should not be used as a sole basis for treatment. Nasal washings and aspirates are unacceptable for Xpert Xpress SARS-CoV-2/FLU/RSV testing.  Fact Sheet for Patients: EntrepreneurPulse.com.au  Fact Sheet for Healthcare Providers: IncredibleEmployment.be  This test is not yet approved or cleared by the [place] FDA and has been authorized for detection  and/or diagnosis of SARS-CoV-2 by FDA under an Emergency Use Authorization (EUA). This EUA will remain in effect (meaning this test can be used) for the duration of the COVID-19 declaration under Section 564(b)(1) of the Act, 21 U.S.C. section 360bbb-3(b)(1), unless the authorization is terminated or revoked.  Performed at  [facility], [address] [place], [place] [address]   MRSA Next Gen by PCR, Nasal     Status: None   Collection Time: 10/07/20 11:53 AM   Specimen: Nasal Mucosa; Nasal Swab  Result Value Ref Range Status   MRSA by PCR Next Gen NOT DETECTED NOT DETECTED Final    Comment: (NOTE) The GeneXpert MRSA Assay (FDA approved for NASAL specimens only), is one component of a comprehensive MRSA colonization surveillance program. It is not intended to diagnose MRSA infection nor to guide or monitor treatment for MRSA infections. Test performance is not FDA approved in patients less than 56 years old. Performed at [facility], [place] [address], [place], [place] [address]   Culture, blood (routine x 2)     Status: None (Preliminary result)   Collection Time: 10/12/20  8:49 AM   Specimen: BLOOD RIGHT ARM  Result Value Ref Range Status   Specimen Description BLOOD RIGHT ARM  Final   Special Requests   Final    BOTTLES DRAWN AEROBIC AND ANAEROBIC Blood Culture adequate volume   Culture   Final    NO GROWTH 4 DAYS Performed at [facility], [address] [place], [place] [address]    Report Status PENDING  Incomplete  Culture, blood (routine x 2)     Status: None (Preliminary result)   Collection Time: 10/12/20  9:03 AM   Specimen: BLOOD RIGHT HAND  Result Value Ref Range Status   Specimen Description BLOOD RIGHT HAND  Final   Special Requests   Final    BOTTLES DRAWN AEROBIC AND ANAEROBIC Blood Culture adequate volume   Culture   Final    NO GROWTH 4 DAYS Performed at [facility], [place] [address] [place], [place] [address]    Report  Status PENDING  Incomplete      Studies: No results found.  Scheduled Meds:  allopurinol  300 mg Oral Daily   enoxaparin (LOVENOX) injection  40 mg Subcutaneous Daily   furosemide  80 mg Intravenous BID   nystatin  5 mL Oral QID   potassium chloride  40 mEq Oral Daily    Continuous Infusions:  sodium chloride 10 mL/hr at 10/09/20 0636     LOS: 10 days     Alma Friendly, MD [facility]  If 7PM-7AM, please contact night-coverage [email or web address] 10/16/2020, 1:58 PM

## 2020-10-16 NOTE — Progress Notes (Signed)
Spoke with Sanda Linger tech and made her aware of una boot orders.

## 2020-10-16 NOTE — Progress Notes (Signed)
Orthopedic Tech Progress Note Patient Details:  Austin Chang [birth year] [id number]  Ortho Devices Type of Ortho Device: Louretta Parma boot Ortho Device/Splint Location: bilateral Ortho Device/Splint Interventions: Application   Post Interventions Patient Tolerated: Well Instructions Provided: Care of device  [place] Pink 10/16/2020, 6:42 PM

## 2020-10-17 DIAGNOSIS — E871 Hypo-osmolality and hyponatremia: Secondary | ICD-10-CM | POA: Diagnosis not present

## 2020-10-17 DIAGNOSIS — K529 Noninfective gastroenteritis and colitis, unspecified: Secondary | ICD-10-CM | POA: Diagnosis not present

## 2020-10-17 DIAGNOSIS — I10 Essential (primary) hypertension: Secondary | ICD-10-CM | POA: Diagnosis not present

## 2020-10-17 DIAGNOSIS — R509 Fever, unspecified: Secondary | ICD-10-CM | POA: Diagnosis not present

## 2020-10-17 LAB — CBC WITH DIFFERENTIAL/PLATELET
Abs Immature Granulocytes: 0.16 10*3/uL — ABNORMAL HIGH (ref 0.00–0.07)
Basophils Absolute: 0 10*3/uL (ref 0.0–0.1)
Basophils Relative: 0 %
Eosinophils Absolute: 0.2 10*3/uL (ref 0.0–0.5)
Eosinophils Relative: 2 %
HCT: 28.1 % — ABNORMAL LOW (ref 39.0–52.0)
Hemoglobin: 9.9 g/dL — ABNORMAL LOW (ref 13.0–17.0)
Immature Granulocytes: 1 %
Lymphocytes Relative: 9 %
Lymphs Abs: 1.2 10*3/uL (ref 0.7–4.0)
MCH: 32.8 pg (ref 26.0–34.0)
MCHC: 35.2 g/dL (ref 30.0–36.0)
MCV: 93 fL (ref 80.0–100.0)
Monocytes Absolute: 0.6 10*3/uL (ref 0.1–1.0)
Monocytes Relative: 4 %
Neutro Abs: 11.6 10*3/uL — ABNORMAL HIGH (ref 1.7–7.7)
Neutrophils Relative %: 84 %
Platelets: 447 10*3/uL — ABNORMAL HIGH (ref 150–400)
RBC: 3.02 MIL/uL — ABNORMAL LOW (ref 4.22–5.81)
RDW: 12.3 % (ref 11.5–15.5)
WBC: 13.8 10*3/uL — ABNORMAL HIGH (ref 4.0–10.5)
nRBC: 0 % (ref 0.0–0.2)

## 2020-10-17 LAB — KAPPA/LAMBDA LIGHT CHAINS
Kappa free light chain: 30.6 mg/L — ABNORMAL HIGH (ref 3.3–19.4)
Kappa, lambda light chain ratio: 0.68 (ref 0.26–1.65)
Lambda free light chains: 44.9 mg/L — ABNORMAL HIGH (ref 5.7–26.3)

## 2020-10-17 LAB — CULTURE, BLOOD (ROUTINE X 2)
Culture: NO GROWTH
Culture: NO GROWTH
Special Requests: ADEQUATE
Special Requests: ADEQUATE

## 2020-10-17 LAB — ANTINUCLEAR ANTIBODIES, IFA: ANA Ab, IFA: NEGATIVE

## 2020-10-17 LAB — BASIC METABOLIC PANEL
Anion gap: 10 (ref 5–15)
Anion gap: 12 (ref 5–15)
BUN: 18 mg/dL (ref 6–20)
BUN: 20 mg/dL (ref 6–20)
CO2: 29 mmol/L (ref 22–32)
CO2: 28 mmol/L (ref 22–32)
Calcium: 8.4 mg/dL — ABNORMAL LOW (ref 8.9–10.3)
Calcium: 8.3 mg/dL — ABNORMAL LOW (ref 8.9–10.3)
Chloride: 88 mmol/L — ABNORMAL LOW (ref 98–111)
Chloride: 90 mmol/L — ABNORMAL LOW (ref 98–111)
Creatinine, Ser: 0.91 mg/dL (ref 0.61–1.24)
Creatinine, Ser: 1.03 mg/dL (ref 0.61–1.24)
GFR, Estimated: >60 mL/min (ref >60)
GFR, Estimated: >60 mL/min (ref >60)
Glucose, Bld: 120 mg/dL — ABNORMAL HIGH (ref 70–99)
Glucose, Bld: 132 mg/dL — ABNORMAL HIGH (ref 70–99)
Potassium: 3.3 mmol/L — ABNORMAL LOW (ref 3.5–5.1)
Potassium: 3.6 mmol/L (ref 3.5–5.1)
Sodium: 127 mmol/L — ABNORMAL LOW (ref 135–145)
Sodium: 130 mmol/L — ABNORMAL LOW (ref 135–145)

## 2020-10-17 MED ORDER — VALSARTAN 160 MG PO TABS: 160.0000 mg | ORAL_TABLET | Freq: Every day | ORAL | 0 refills | Status: DC

## 2020-10-17 MED ORDER — FUROSEMIDE 40 MG PO TABS: 40.0000 mg | ORAL_TABLET | Freq: Two times a day (BID) | ORAL | 0 refills | Status: DC

## 2020-10-17 MED ORDER — FUROSEMIDE 10 MG/ML IJ SOLN
80.0000 mg | Freq: Once | INTRAMUSCULAR | Status: AC
  Administered 2020-10-17: 80 mg via INTRAVENOUS
  Filled 2020-10-17: qty 8

## 2020-10-17 MED ORDER — FUROSEMIDE 10 MG/ML IJ SOLN: 80.0000 mg | Freq: Once | INTRAMUSCULAR | Status: DC

## 2020-10-17 MED ORDER — CELECOXIB 200 MG PO CAPS: 200.0000 mg | ORAL_CAPSULE | Freq: Two times a day (BID) | ORAL | Status: AC | PRN

## 2020-10-17 NOTE — Progress Notes (Signed)
Physical Therapy Treatment Patient Details Name: Austin Chang MRN: [id number] DOB: 1965-12-05 Today's Date: 10/17/2020   History of Present Illness Austin Chang is an 55 y.o. male past medical history significant for ulcerative colitis on Humira BMI of 38 essential hypertension ankylosing spondylitis presents to the ED for evaluation of fever fatigue loss of appetite that began on 10/02/2020, in the ED he was found to be febrile satting 90 tachycardic and tachypneic, mild hyponatremia elevated procalcitonin CTA negative for PE.  Admitted for sepsis due to CAP.    PT Comments    Patient progressing with ambulation (states walked unaided in hallway earlier today).  Still with LE weakness, but still feel he will progress well without follow up PT.  Education in fall prevention performed.  Will follow up if not d/c.     Recommendations for follow up therapy are one component of a multi-disciplinary discharge planning process, led by the attending physician.  Recommendations may be updated based on patient status, additional functional criteria and insurance authorization.  Follow Up Recommendations  No PT follow up     Equipment Recommendations       Recommendations for Other Services       Precautions / Restrictions Precautions Precautions: Fall Restrictions Weight Bearing Restrictions: No     Mobility  Bed Mobility Overal bed mobility: Modified Independent                  Transfers Overall transfer level: Modified independent Equipment used: None                Ambulation/Gait Ambulation/Gait assistance: Min guard;Min assist Gait Distance (Feet): 150 Feet Assistive device: None;1 person hand held assist Gait Pattern/deviations: Step-through pattern;Decreased stride length;Wide base of support     General Gait Details: asking for HHA throughout but can walk in the room unsupported; needs standing rest and slow pace to manage breathing and  HR   Stairs Stairs: Yes Stairs assistance: Min guard Stair Management: One rail Left;Step to pattern Number of Stairs: 2 General stair comments: initially hit toe on step due to not lifting his foot high enough, but reports his steps only a brick high and separated by a walkway   Wheelchair Mobility    Modified Rankin (Stroke Patients Only)       Balance Overall balance assessment: Needs assistance   Sitting balance-Leahy Scale: Good       Standing balance-Leahy Scale: Fair                              Cognition Arousal/Alertness: Awake/alert Behavior During Therapy: WFL for tasks assessed/performed Overall Cognitive Status: Within Functional Limits for tasks assessed                                        Exercises      General Comments General comments (skin integrity, edema, etc.): HR 101, SpO2 98% on RA after ambulation; fall risk reducation education given (footwear, lighting, shower seat/grabbar, items within reach used daily      Pertinent Vitals/Pain Faces Pain Scale: No hurt    Home Living                      Prior Function            PT Goals (current goals can now be  found in the care plan section) Progress towards PT goals: Progressing toward goals    Frequency    Min 3X/week      PT Plan Current plan remains appropriate    Co-evaluation              AM-PAC PT "6 Clicks" Mobility   Outcome Measure  Help needed turning from your back to your side while in a flat bed without using bedrails?: None Help needed moving from lying on your back to sitting on the side of a flat bed without using bedrails?: None Help needed moving to and from a bed to a chair (including a wheelchair)?: None Help needed standing up from a chair using your arms (e.g., wheelchair or bedside chair)?: None Help needed to walk in hospital room?: A Little Help needed climbing 3-5 steps with a railing? : A Little 6 Click  Score: 22    End of Session   Activity Tolerance: Patient tolerated treatment well Patient left: in bed;with call bell/phone within reach   PT Visit Diagnosis: Other abnormalities of gait and mobility (R26.89);Muscle weakness (generalized) (M62.81)     Time: 9179-1505 PT Time Calculation (min) (ACUTE ONLY): 15 min  Charges:  $Gait Training: 8-22 mins                     Magda Kiel, PT Acute Rehabilitation Services WPVXY:[phone] Office:[phone] 10/17/2020    Reginia Naas 10/17/2020, 4:35 PM

## 2020-10-17 NOTE — Progress Notes (Signed)
[facility]  Date of Admission:  10/06/2020           Reason for visit: Follow up on FUO  Current antibiotics: None  Previous antibiotics: Azithromycin x1 dose 9/8  Ceftriaxone x1 dose 9/8 Doxycycline 9/10-9/11 Vancomycin 9/8-9/9 Metronidazole 9/8-9/10; 9/12-9/13 Cefepime 9/8-9/15   ASSESSMENT:    55 y.o. male admitted with:  Fevers of unknown etiology: Extensive infectious work-up was unrevealing and fevers have abated over the last several days interestingly since the discontinuation of cefepime. Ulcerative colitis and ankylosing spondylitis on Humira Hyponatremia Low back pain with negative CT lumbar spine after inability to tolerate MRI Possible drug fever  RECOMMENDATIONS:    Fevers have resolved and infectious work-up has been unrevealing.  Will sign off for now and okay to discharge from our perspective.  Recommend continued follow-up with his primary care and rheumatologist. Please call as needed.   Principal Problem:   Sepsis ([place]) Active Problems:   Essential hypertension   Inflammatory bowel disease   Hyponatremia   Bilateral pneumonia    MEDICATIONS:    Scheduled Meds:  allopurinol  300 mg Oral Daily   enoxaparin (LOVENOX) injection  40 mg Subcutaneous Daily   furosemide  80 mg Intravenous BID   nystatin  5 mL Oral QID   potassium chloride  40 mEq Oral BID   Continuous Infusions:  sodium chloride 10 mL/hr at 10/09/20 0636   PRN Meds:.sodium chloride, acetaminophen, alum & mag hydroxide-simeth, ondansetron **OR** ondansetron (ZOFRAN) IV, oxyCODONE, zolpidem  SUBJECTIVE:   24 hour events:  Remains afebrile with his last recorded temperature of 100.9 at 5 AM on 9/16 CT of the lumbar spine was negative Leukocytosis has improved No other revealing labs thus far  No new complaints.  Feels as well as he has in a long time.  Anxious to get discharged and go home.   Review of Systems  All other systems reviewed  and are negative.    OBJECTIVE:   Blood pressure 106/60, pulse 94, temperature (!) 97.5 F (36.4 C), temperature source Oral, resp. rate 18, height 5' 8"  (1.727 m), weight 111.2 kg, SpO2 94 %. Body mass index is 37.27 kg/m.  Physical Exam Constitutional:      General: He is not in acute distress.    Appearance: Normal appearance.  HENT:     Head: Normocephalic and atraumatic.  Eyes:     Extraocular Movements: Extraocular movements intact.     Conjunctiva/sclera: Conjunctivae normal.  Pulmonary:     Effort: Pulmonary effort is normal. No respiratory distress.  Abdominal:     General: There is no distension.     Palpations: Abdomen is soft.  Musculoskeletal:     Cervical back: Normal range of motion and neck supple.     Comments: Unna boots placed.   Skin:    General: Skin is warm and dry.  Neurological:     General: No focal deficit present.     Mental Status: He is alert and oriented to person, place, and time.  Psychiatric:        Mood and Affect: Mood normal.        Behavior: Behavior normal.     Lab Results: Lab Results  Component Value Date   WBC 13.8 (H) 10/17/2020   HGB 9.9 (L) 10/17/2020   HCT 28.1 (L) 10/17/2020   MCV 93.0 10/17/2020   PLT 447 (H) 10/17/2020    Lab Results  Component Value Date  NA 127 (L) 10/17/2020   K 3.3 (L) 10/17/2020   CO2 29 10/17/2020   GLUCOSE 120 (H) 10/17/2020   BUN 18 10/17/2020   CREATININE 0.91 10/17/2020   CALCIUM 8.4 (L) 10/17/2020   GFRNONAA >60 10/17/2020   GFRAA >60 08/18/2010    Lab Results  Component Value Date   ALT 27 10/12/2020   AST 43 (H) 10/12/2020   ALKPHOS 204 (H) 10/12/2020   BILITOT 1.3 (H) 10/12/2020       Component Value Date/Time   CRP 23.0 (H) 10/12/2020 1049       Component Value Date/Time   ESRSEDRATE 85 (H) 10/12/2020 1049     I have reviewed the micro and lab results in Epic.    [facility] [phone] pager 10/17/2020, 11:14 AM  I spent greater than 35 minutes with the patient including greater than 50% of time in face to face counsel of the patient and in coordination of their care.

## 2020-10-17 NOTE — Plan of Care (Signed)
  Problem: Education: Goal: Knowledge of General Education information will improve Description: Including pain rating scale, medication(s)/side effects and non-pharmacologic comfort measures Outcome: Adequate for Discharge   Problem: Health Behavior/Discharge Planning: Goal: Ability to manage health-related needs will improve Outcome: Adequate for Discharge   Problem: Clinical Measurements: Goal: Ability to maintain clinical measurements within normal limits will improve Outcome: Adequate for Discharge Goal: Cardiovascular complication will be avoided Outcome: Adequate for Discharge   Problem: Skin Integrity: Goal: Risk for impaired skin integrity will decrease Outcome: Adequate for Discharge   Problem: Clinical Measurements: Goal: Signs and symptoms of infection will decrease Outcome: Adequate for Discharge

## 2020-10-17 NOTE — Discharge Summary (Signed)
Discharge Summary  Austin Chang:[id number] DOB: 1965-12-03  PCP: Josetta Huddle, MD  Admit date: 10/06/2020 Discharge date: 10/17/2020  Time spent: 40 mins  Recommendations for Outpatient Follow-up:  PCP in 1 week- to follow with repeat labs  Discharge Diagnoses:  Active Hospital Problems   Diagnosis Date Noted   Sepsis ([place]) 10/06/2020   Bilateral pneumonia 10/07/2020   Inflammatory bowel disease 01/31/2020   Hyponatremia 01/31/2020   Essential hypertension 01/31/2020    Resolved Hospital Problems  No resolved problems to display.    Discharge Condition: Stable  Diet recommendation: Heart healthy  Vitals:   10/17/20 0541 10/17/20 0916  BP: 109/61 106/60  Pulse: 90 94  Resp: 20 18  Temp: 97.6 F (36.4 C) (!) 97.5 F (36.4 C)  SpO2: 94% 93%    History of present illness:  55 year old male with medical history significant for ulcerative colitis on Humira, hypertension, ankylosing spondylitis, presents to the ED for evaluation of fever of unknown origin that began on 10/02/2020.  So far extensive work-up has been unremarkable.  ID was consulted.    Today, pt very eager to be discharged and stated he wont be able to stay in the hospital another night. Gave his 2 dose of lasix and repeated another BMP which showed improvement of his sodium level. Denies any new complaints. Unna boots helped. Still with about 2+ edema, although improving. Pt advised extensively to follow up with his PCP with repeat labs and medication (lasix) adjustment. Advised fluid restriction and med compliance. If swelling worsens, he needs to see his PCP ASAP.    Hospital Course:  Principal Problem:   Sepsis ([place]) Active Problems:   Essential hypertension   Inflammatory bowel disease   Hyponatremia   Bilateral pneumonia   Fever of unknown origin Remained afebrile > 48H, still with leukocytosis Hx of salmonella bacteremia >20 years ago Inflammatory markers, procalcitonin all  elevated Repeat BC X2 NGTD CT abdomen/pelvis, chest unremarkable ID consulted, recommend back imaging due to pain, unable to tolerate MRI even with sedation. CT lumbar spine with no evidence for infection Discussed with Dr Burr [facility] oncology on 10/13/20, about abnormal SPEP lab, stated it may be ??MGUS (unlikely to cause fever), further labs ordered to further evaluate and follow up if needed Doppler LE r/o DVT Completed 7 days of antibiotics Follow up with PCP   Hypervolemic hyponatremia Serum osmolality 264, urine osmolality 491, urine sodium <10 Appeared vol overloaded Received high dose IV lasix 80 mg BID while inpatient B/L Unna boots applied for 3+ edema D/C on PO lasix 40 mg BID Follow with PCP for repeat labs, monitor bmp, and medication (lasix adjustment prn)   Hypokalemia Continue oral K+ supplementation   HTN BP stable, somewhat soft Discontinued PTA amlodipine (could worsen edema) Continue valsartan and check BP daily Follow up with PCP for further adjustments   Acute on chronic diastolic HF ECHO showed EF of 65-70%, no RWMA, grade 1DD Appeared overloaded CXR unremarkable D/C on PO lasix as above  Ulcerative colitis Resume Humira as an outpatient   Obesity Lifestyle modification advised    Estimated body mass index is 37.27 kg/m as calculated from the following:   Height as of this encounter: 5' 8"  (1.727 m).   Weight as of this encounter: 111.2 kg.    Procedures: None  Consultations: ID  Discharge Exam: BP 106/60 (BP Location: Right Arm)   Pulse 94   Temp (!) 97.5 F (36.4 C) (Oral)   Resp 18   Ht  5' 8"  (1.727 m)   Wt 111.2 kg   SpO2 93%   BMI 37.27 kg/m   General: NAD  Cardiovascular: S1, S2 present Respiratory: CTAB Abdomen: Soft, nontender, nondistended, bowel sounds present Musculoskeletal: 2+ bilateral pedal edema noted, UNNA boots placed on BLE Skin: Normal Psychiatry: Normal mood    Discharge Instructions You were cared for by a  hospitalist during your hospital stay. If you have any questions about your discharge medications or the care you received while you were in the hospital after you are discharged, you can call the unit and asked to speak with the hospitalist on call if the hospitalist that took care of you is not available. Once you are discharged, your primary care physician will handle any further medical issues. Please note that NO REFILLS for any discharge medications will be authorized once you are discharged, as it is imperative that you return to your primary care physician (or establish a relationship with a primary care physician if you do not have one) for your aftercare needs so that they can reassess your need for medications and monitor your lab values.   Allergies as of 10/17/2020       Reactions   Septra [bactrim] Other (See Comments)   Very bad migraine   Sulfamethoxazole-trimethoprim    Other reaction(s): massive headache        Medication List     STOP taking these medications    amLODipine-valsartan 10-160 MG tablet Commonly known as: EXFORGE   azithromycin 250 MG tablet Commonly known as: Zithromax       TAKE these medications    acetaminophen 500 MG tablet Commonly known as: TYLENOL Take 500-1,000 mg by mouth every 6 (six) hours as needed for mild pain.   allopurinol 300 MG tablet Commonly known as: ZYLOPRIM Take 300 mg by mouth daily.   celecoxib 200 MG capsule Commonly known as: CELEBREX Take 1 capsule (200 mg total) by mouth 2 (two) times daily as needed. What changed:  when to take this reasons to take this   Colchicine 0.6 MG Caps Take 0.6 mg by mouth daily as needed (gout flare up).   furosemide 40 MG tablet Commonly known as: LASIX Take 1 tablet (40 mg total) by mouth 2 (two) times daily. What changed: when to take this   Humira Pen 40 MG/0.4ML Pnkt Generic drug: Adalimumab 40 mg by Subdermal route every 14 (fourteen) days.   indomethacin 50 MG  capsule Commonly known as: INDOCIN Take 50 mg by mouth 2 (two) times daily with a meal.   indomethacin 75 MG CR capsule Commonly known as: INDOCIN SR Take 75 mg by mouth daily as needed for mild pain. For inflammation.   Magnesium 300 MG Caps Take 300 mg by mouth daily.   ondansetron 8 MG disintegrating tablet Commonly known as: ZOFRAN-ODT Take 8 mg by mouth every 8 (eight) hours as needed for nausea/vomiting.   Potassium 99 MG Tabs Take 1 tablet by mouth daily.   thiamine 100 MG tablet Commonly known as: Vitamin B-1 Take 100 mg by mouth daily.   valsartan 160 MG tablet Commonly known as: Diovan Take 1 tablet (160 mg total) by mouth daily.   Vitamin D 50 MCG (2000 UT) Caps Take 2,000 Units by mouth daily.   zolpidem 10 MG tablet Commonly known as: AMBIEN Take 10 mg by mouth at bedtime as needed for sleep.       Allergies  Allergen Reactions   Septra [Bactrim] Other (See Comments)  Very bad migraine   Sulfamethoxazole-Trimethoprim     Other reaction(s): massive headache    Follow-up Information     Josetta Huddle, MD. Schedule an appointment as soon as possible for a visit in 1 week(s).   Specialty: Internal Medicine Contact information: [address] [place] [address] [phone]                  The results of significant diagnostics from this hospitalization (including imaging, microbiology, ancillary and laboratory) are listed below for reference.    Significant Diagnostic Studies: CT Angio Chest PE W/Cm &/Or Wo Cm  Result Date: 10/06/2020 CLINICAL DATA:  PE suspected, low/intermediate prob, positive D-dimer EXAM: CT ANGIOGRAPHY CHEST WITH CONTRAST TECHNIQUE: Multidetector CT imaging of the chest was performed using the standard protocol during bolus administration of intravenous contrast. Multiplanar CT image reconstructions and MIPs were obtained to evaluate the vascular anatomy. CONTRAST:  184m OMNIPAQUE IOHEXOL 350 MG/ML  SOLN COMPARISON:  None. FINDINGS: Cardiovascular: Satisfactory opacification of the pulmonary arteries to the segmental level. No evidence of pulmonary embolism. The thoracic aorta is normal in caliber. Normal heart size. No pericardial effusion. Mediastinum/Nodes: No enlarged mediastinal, hilar, or axillary lymph nodes. The thyroid gland appears normal. Lungs/Pleura: No pleural effusion. No pneumothorax. No mass or focal consolidation. No suspicious pulmonary nodules. Musculoskeletal: No aggressive osseous lesions. Calcification of the anterior longitudinal ligament in the thoracolumbar spine consistent with history of ankylosing spondylitis. Upper abdomen: Cholelithiasis. A couple of calcified lymph nodes are noted in the upper abdomen. Review of the MIP images confirms the above findings. IMPRESSION: No findings of pulmonary embolism. No acute focal process in the chest. Other ancillary findings as described. Electronically Signed   By: YAlbin FellingM.D.   On: 10/06/2020 16:49   CT LUMBAR SPINE W CONTRAST  Result Date: 10/14/2020 CLINICAL DATA:  Low back pain. Infection suspected. Fever of unknown origin. EXAM: CT LUMBAR SPINE WITH CONTRAST TECHNIQUE: Multidetector CT imaging of the lumbar spine was performed with intravenous contrast administration. CONTRAST:  823mOMNIPAQUE IOHEXOL 350 MG/ML SOLN COMPARISON:  CT abdomen pelvis 10/10/2020 FINDINGS: Segmentation: 5 non rib-bearing lumbar type vertebral bodies are present. The lowest fully formed vertebral body is L5. Alignment: No significant listhesis is present. Ankylosis lumbar spine extends from the highest visualized segment of T9-10 through L5. Mild leftward curvature is centered at L2-3 Vertebrae: Vertebral body heights are maintained. No erosions or focal osseous lesions present. Paraspinal and other soft tissues: Minimal atherosclerotic calcifications are present. No solid organ lesions are present. Paraspinous soft tissues are within normal  limits. Disc levels: Is thoracolumbar spine is fused T10 through L5. No significant stenosis is present at L3-4 or above. L4-5: Asymmetric left-sided facet hypertrophy results in mild left foraminal narrowing. Central canal is patent. There is no fusion at L5-S1. Advanced facet hypertrophy is noted. Moderate foraminal narrowing is worse left than right. Vacuum disc phenomenon is present. There is some encroachment on the central canal is well. The SI joints are fused bilaterally. IMPRESSION: 1. Ankylosing spondylitis with of the thoracolumbar spine extends from the highest visualized segment of T9-10 through L5. 2. Moderate foraminal narrowing bilaterally at L5-S1 is worse left than right secondary to asymmetric facet hypertrophy without fusion at the L5-S1 level. This may be the source of patient's pain. No osseous erosion or evidence for infection. 3. Mild left foraminal narrowing at L4-5. 4. Fusion of the SI joints bilaterally. Electronically Signed   By: ChWynetta Fines.  On: 10/14/2020 13:40   CT ABDOMEN PELVIS W CONTRAST  Result Date: 10/10/2020 CLINICAL DATA:  Abdominal pain, fever EXAM: CT ABDOMEN AND PELVIS WITH CONTRAST TECHNIQUE: Multidetector CT imaging of the abdomen and pelvis was performed using the standard protocol following bolus administration of intravenous contrast. CONTRAST:  138m OMNIPAQUE IOHEXOL 350 MG/ML SOLN COMPARISON:  02/20/2010 FINDINGS: Lower chest: Mild bibasilar atelectasis. Cardiac size within normal limits. No pericardial effusion. Hepatobiliary: Mild hepatic steatosis. No enhancing intrahepatic mass. No intra or extrahepatic biliary ductal dilation. Cholelithiasis without pericholecystic inflammatory change noted. Pancreas: Unremarkable Spleen: Unremarkable Adrenals/Urinary Tract: Adrenal glands are unremarkable. Kidneys are normal, without renal calculi, focal lesion, or hydronephrosis. Bladder to the left anterior pelvic wall likely related to inflammatory  adhesions related to diverticular abscess noted on prior examination. The bladder is otherwise unremarkable. Stomach/Bowel: The stomach, small bowel, and large bowel are unremarkable. Appendix is absent. No free intraperitoneal gas or fluid. Vascular/Lymphatic: Mild aortoiliac atherosclerotic calcification. No aortic aneurysm. No pathologic adenopathy within the abdomen and pelvis. Reproductive: Prostate is unremarkable. Other: Diastasis of the rectus abdominus musculature noted. No abdominal wall hernia. The rectum is unremarkable. Musculoskeletal: Changes of ankylosing spondylitis are again identified with ankylosis of the facets of the lumbar spine sacroiliac joints bilaterally as well as flowing syndesmophytes noted throughout the lumbar spine. No acute abnormality. IMPRESSION: No acute intra-abdominal pathology identified. No definite radiographic explanation for the patient's reported symptoms. Cholelithiasis. Mild hepatic steatosis. Tethering of the bladder anteriorly likely related to post inflammatory adhesions. No evidence of bladder outlet obstruction. Chronic changes of ankylosing spondylitis Aortic Atherosclerosis (ICD10-I70.0). Electronically Signed   By: AFidela SalisburyM.D.   On: 10/10/2020 17:16   DG Chest Port 1 View  Result Date: 10/12/2020 CLINICAL DATA:  Fever, shortness of breath, and abdominal distension. EXAM: PORTABLE CHEST 1 VIEW COMPARISON:  Chest x-ray and CTA chest dated October 06, 2020. FINDINGS: The heart size and mediastinal contours are within normal limits. Both lungs are clear. The visualized skeletal structures are unremarkable. IMPRESSION: No active disease. Electronically Signed   By: WTitus DubinM.D.   On: 10/12/2020 17:56   DG Chest Port 1 View  Result Date: 10/06/2020 CLINICAL DATA:  55year old male with history of fever and cough. Fatigue. EXAM: PORTABLE CHEST 1 VIEW COMPARISON:  Chest x-ray 01/30/2020. FINDINGS: Lung volumes are low. No consolidative airspace  disease. No pleural effusions. No pneumothorax. No pulmonary nodule or mass noted. Pulmonary vasculature and the cardiomediastinal silhouette are within normal limits. IMPRESSION: 1. Low lung volumes without radiographic evidence of acute cardiopulmonary disease. Electronically Signed   By: DVinnie LangtonM.D.   On: 10/06/2020 14:56   DG Abd Portable 1V  Result Date: 10/12/2020 CLINICAL DATA:  Fever, shortness of breath, and abdominal distension. EXAM: PORTABLE ABDOMEN - 1 VIEW COMPARISON:  CT abdomen pelvis dated October 10, 2020. Abdominal x-ray dated August 17, 2010. FINDINGS: The bowel gas pattern is normal. Unchanged mild gaseous distension of the transverse colon. No radio-opaque calculi or other significant radiographic abnormality are seen. IMPRESSION: 1. Negative. Electronically Signed   By: WTitus DubinM.D.   On: 10/12/2020 17:57   ECHOCARDIOGRAM COMPLETE  Result Date: 10/10/2020    ECHOCARDIOGRAM REPORT   Patient Name:   RELION HOCKERDate of Exam: 10/10/2020 Medical Rec #:  [id number]    Height:       68.0 in Accession #:    [id number]   Weight:       260.5 lb Date of Birth:  May 16, 1965     BSA:          2.287 m Patient Age:    1 years      BP:           113/69 mmHg Patient Gender: M             HR:           104 bpm. Exam Location:  Inpatient Procedure: 2D Echo, Cardiac Doppler, Color Doppler and Intracardiac            Opacification Agent Indications:    Fever  History:        Patient has no prior history of Echocardiogram examinations.                 Signs/Symptoms:Fever. Covid 19. PNA. Fatigue.  Sonographer:    Merrie Roof RDCS Referring Phys: [place]  1. Left ventricular ejection fraction, by estimation, is 65 to 70%. The left ventricle has normal function. The left ventricle has no regional wall motion abnormalities. Left ventricular diastolic parameters are consistent with Grade I diastolic dysfunction (impaired relaxation).  2. Right ventricular  systolic function is normal. The right ventricular size is normal. There is normal pulmonary artery systolic pressure.  3. The mitral valve is grossly normal. Trivial mitral valve regurgitation.  4. The aortic valve was not well visualized. Aortic valve regurgitation is not visualized. Aortic valve mean gradient measures 11.0 mmHg.  5. The inferior vena cava is normal in size with greater than 50% respiratory variability, suggesting right atrial pressure of 3 mmHg.  6. Increased flow velocities may be secondary to anemia, thyrotoxicosis, hyperdynamic or high flow state. Comparison(s): No prior Echocardiogram. FINDINGS  Left Ventricle: Left ventricular ejection fraction, by estimation, is 65 to 70%. The left ventricle has normal function. The left ventricle has no regional wall motion abnormalities. The left ventricular internal cavity size was normal in size. There is  no left ventricular hypertrophy. Left ventricular diastolic parameters are consistent with Grade I diastolic dysfunction (impaired relaxation). Indeterminate filling pressures. Right Ventricle: The right ventricular size is normal. No increase in right ventricular wall thickness. Right ventricular systolic function is normal. There is normal pulmonary artery systolic pressure. The tricuspid regurgitant velocity is 2.66 m/s, and  with an assumed right atrial pressure of 3 mmHg, the estimated right ventricular systolic pressure is 84.1 mmHg. Left Atrium: Left atrial size was normal in size. Right Atrium: Right atrial size was normal in size. Pericardium: There is no evidence of pericardial effusion. Mitral Valve: The mitral valve is grossly normal. Trivial mitral valve regurgitation. Tricuspid Valve: The tricuspid valve is grossly normal. Tricuspid valve regurgitation is trivial. Aortic Valve: The aortic valve was not well visualized. Aortic valve regurgitation is not visualized. Aortic valve mean gradient measures 11.0 mmHg. Aortic valve peak gradient  measures 18.7 mmHg. Aortic valve area, by VTI measures 1.78 cm. Pulmonic Valve: The pulmonic valve was grossly normal. Pulmonic valve regurgitation is trivial. Aorta: The aortic root and ascending aorta are structurally normal, with no evidence of dilitation. Venous: The inferior vena cava is normal in size with greater than 50% respiratory variability, suggesting right atrial pressure of 3 mmHg. IAS/Shunts: No atrial level shunt detected by color flow Doppler.  LEFT VENTRICLE PLAX 2D LVIDd:         4.70 cm  Diastology LVIDs:         2.90 cm  LV e' medial:    7.29 cm/s LV PW:  1.10 cm  LV E/e' medial:  13.6 LV IVS:        1.00 cm  LV e' lateral:   7.51 cm/s LVOT diam:     2.00 cm  LV E/e' lateral: 13.2 LV SV:         70 LV SV Index:   31 LVOT Area:     3.14 cm  RIGHT VENTRICLE RV Basal diam:  4.00 cm LEFT ATRIUM             Index       RIGHT ATRIUM           Index LA diam:        4.50 cm 1.97 cm/m  RA Area:     14.20 cm LA Vol (A2C):   85.9 ml 37.57 ml/m RA Volume:   31.90 ml  13.95 ml/m LA Vol (A4C):   60.0 ml 26.24 ml/m LA Biplane Vol: 75.8 ml 33.15 ml/m  AORTIC VALVE AV Area (Vmax):    2.17 cm AV Area (Vmean):   1.98 cm AV Area (VTI):     1.78 cm AV Vmax:           216.00 cm/s AV Vmean:          158.000 cm/s AV VTI:            0.393 m AV Peak Grad:      18.7 mmHg AV Mean Grad:      11.0 mmHg LVOT Vmax:         149.00 cm/s LVOT Vmean:        99.800 cm/s LVOT VTI:          0.223 m LVOT/AV VTI ratio: 0.57  AORTA Ao Root diam: 3.10 cm Ao Asc diam:  3.20 cm MITRAL VALVE               TRICUSPID VALVE MV Area (PHT): 4.80 cm    TR Peak grad:   28.3 mmHg MV Decel Time: 158 msec    TR Vmax:        266.00 cm/s MV E velocity: 99.40 cm/s MV A velocity: 82.30 cm/s  SHUNTS MV E/A ratio:  1.21        Systemic VTI:  0.22 m                            Systemic Diam: 2.00 cm Lyman Bishop MD Electronically signed by Lyman Bishop MD Signature Date/Time: 10/10/2020/5:56:08 PM    Final    VAS [place] LOWER EXTREMITY  VENOUS (DVT)  Result Date: 10/10/2020  Lower Venous DVT Study Patient Name:  AARSH FRISTOE  Date of Exam:   10/09/2020 Medical Rec #: [id number]      Accession #:    [id number] Date of Birth: May 28, 1965      Patient Gender: M Patient Age:   55 years Exam Location:  [facility] Procedure:      VAS [place] LOWER EXTREMITY VENOUS (DVT) Referring Phys: Bess Harvest ORTIZ --------------------------------------------------------------------------------  Indications: FUO.  Limitations: Poor ultrasound/tissue interface. Comparison Study: No prior studies. Performing Technologist: Oliver Hum RVT  Examination Guidelines: A complete evaluation includes B-mode imaging, spectral Doppler, color Doppler, and power Doppler as needed of all accessible portions of each vessel. Bilateral testing is considered an integral part of a complete examination. Limited examinations for reoccurring indications may be performed as noted. The reflux portion of the exam is performed with the patient in  reverse Trendelenburg.  +---------+---------------+---------+-----------+----------+--------------+ RIGHT    CompressibilityPhasicitySpontaneityPropertiesThrombus Aging +---------+---------------+---------+-----------+----------+--------------+ CFV      Full           Yes      Yes                                 +---------+---------------+---------+-----------+----------+--------------+ SFJ      Full                                                        +---------+---------------+---------+-----------+----------+--------------+ FV Prox  Full                                                        +---------+---------------+---------+-----------+----------+--------------+ FV Mid                  Yes      Yes                                 +---------+---------------+---------+-----------+----------+--------------+ FV Distal               Yes      Yes                                  +---------+---------------+---------+-----------+----------+--------------+ PFV      Full                                                        +---------+---------------+---------+-----------+----------+--------------+ POP      Full           Yes      Yes                                 +---------+---------------+---------+-----------+----------+--------------+ PTV      Full                                                        +---------+---------------+---------+-----------+----------+--------------+ PERO     Full                                                        +---------+---------------+---------+-----------+----------+--------------+   +---------+---------------+---------+-----------+----------+--------------+ LEFT     CompressibilityPhasicitySpontaneityPropertiesThrombus Aging +---------+---------------+---------+-----------+----------+--------------+ CFV      Full           Yes      Yes                                 +---------+---------------+---------+-----------+----------+--------------+  SFJ      Full                                                        +---------+---------------+---------+-----------+----------+--------------+ FV Prox  Full                                                        +---------+---------------+---------+-----------+----------+--------------+ FV Mid   Full                                                        +---------+---------------+---------+-----------+----------+--------------+ FV Distal               Yes      Yes                                 +---------+---------------+---------+-----------+----------+--------------+ PFV      Full                                                        +---------+---------------+---------+-----------+----------+--------------+ POP      Full           Yes      Yes                                  +---------+---------------+---------+-----------+----------+--------------+ PTV      Full                                                        +---------+---------------+---------+-----------+----------+--------------+ PERO     Full                                                        +---------+---------------+---------+-----------+----------+--------------+     Summary: RIGHT: - There is no evidence of deep vein thrombosis in the lower extremity. However, portions of this examination were limited- see technologist comments above.  - No cystic structure found in the popliteal fossa.  LEFT: - There is no evidence of deep vein thrombosis in the lower extremity. However, portions of this examination were limited- see technologist comments above.  - No cystic structure found in the popliteal fossa.  *See table(s) above for measurements and observations. Electronically signed by Servando Snare MD on 10/10/2020 at 11:10:16 PM.    Final     Microbiology: Recent Results (from the past 240 hour(s))  Culture, blood (  routine x 2)     Status: None   Collection Time: 10/12/20  8:49 AM   Specimen: BLOOD RIGHT ARM  Result Value Ref Range Status   Specimen Description BLOOD RIGHT ARM  Final   Special Requests   Final    BOTTLES DRAWN AEROBIC AND ANAEROBIC Blood Culture adequate volume   Culture   Final    NO GROWTH 5 DAYS Performed at [facility], [address] [place], [place] [address]    Report Status 10/17/2020 FINAL  Final  Culture, blood (routine x 2)     Status: None   Collection Time: 10/12/20  9:03 AM   Specimen: BLOOD RIGHT HAND  Result Value Ref Range Status   Specimen Description BLOOD RIGHT HAND  Final   Special Requests   Final    BOTTLES DRAWN AEROBIC AND ANAEROBIC Blood Culture adequate volume   Culture   Final    NO GROWTH 5 DAYS Performed at [facility], [place] [address], [place],  [address]    Report Status 10/17/2020 FINAL  Final     Labs: Basic  Metabolic Panel: Recent Labs  Lab 10/13/20 0334 10/14/20 0158 10/15/20 0336 10/15/20 0828 10/16/20 0347 10/17/20 0317  NA 121* 120* 124*  --  125* 127*  K 3.6 3.9 3.4*  --  3.2* 3.3*  CL 89* 83* 85*  --  86* 88*  CO2 24 25 29   --  30 29  GLUCOSE 110* 109* 122*  --  126* 120*  BUN 16 17 16   --  17 18  CREATININE 0.98 1.05 1.01  --  0.96 0.91  CALCIUM 7.9* 8.3* 8.0*  --  8.1* 8.4*  MG  --   --   --  2.0  --   --    Liver Function Tests: Recent Labs  Lab 10/12/20 1049  AST 43*  ALT 27  ALKPHOS 204*  BILITOT 1.3*  PROT 5.4*  ALBUMIN 2.1*   No results for input(s): LIPASE, AMYLASE in the last 168 hours. No results for input(s): AMMONIA in the last 168 hours. CBC: Recent Labs  Lab 10/13/20 0334 10/14/20 0158 10/15/20 0336 10/16/20 0347 10/17/20 0317  WBC 14.7* 16.9* 14.0* 13.9* 13.8*  NEUTROABS 13.0* 14.4* 12.0* 11.7* 11.6*  HGB 11.5* 11.8* 11.1* 10.1* 9.9*  HCT 32.4* 32.9* 30.7* 29.0* 28.1*  MCV 92.3 92.9 91.9 92.7 93.0  PLT 303 348 381 416* 447*   Cardiac Enzymes: No results for input(s): CKTOTAL, CKMB, CKMBINDEX, TROPONINI in the last 168 hours. BNP: BNP (last 3 results) Recent Labs    10/06/20 1408  BNP 40.0    ProBNP (last 3 results) No results for input(s): PROBNP in the last 8760 hours.  CBG: No results for input(s): GLUCAP in the last 168 hours.     Signed:  Alma Friendly, MD [facility] 10/17/2020, 2:39 PM

## 2020-10-18 ENCOUNTER — Inpatient Hospital Stay (HOSPITAL_COMMUNITY)
Admission: EM | Admit: 2020-10-18 | Discharge: 2020-10-21 | DRG: 371 | Disposition: A | Discharge status: Home / Self Care | Payer: BC Managed Care – PPO | Attending: Internal Medicine | Admitting: Internal Medicine

## 2020-10-18 ENCOUNTER — Emergency Department (HOSPITAL_COMMUNITY): Payer: BC Managed Care – PPO

## 2020-10-18 DIAGNOSIS — M87151 Osteonecrosis due to drugs, right femur: Secondary | ICD-10-CM | POA: Diagnosis present

## 2020-10-18 DIAGNOSIS — I2510 Atherosclerotic heart disease of native coronary artery without angina pectoris: Secondary | ICD-10-CM | POA: Diagnosis not present

## 2020-10-18 DIAGNOSIS — Z8616 Personal history of COVID-19: Secondary | ICD-10-CM

## 2020-10-18 DIAGNOSIS — K922 Gastrointestinal hemorrhage, unspecified: Secondary | ICD-10-CM | POA: Diagnosis present

## 2020-10-18 DIAGNOSIS — K5190 Ulcerative colitis, unspecified, without complications: Secondary | ICD-10-CM | POA: Diagnosis present

## 2020-10-18 DIAGNOSIS — F40240 Claustrophobia: Secondary | ICD-10-CM | POA: Diagnosis present

## 2020-10-18 DIAGNOSIS — I898 Other specified noninfective disorders of lymphatic vessels and lymph nodes: Secondary | ICD-10-CM | POA: Diagnosis not present

## 2020-10-18 DIAGNOSIS — K921 Melena: Secondary | ICD-10-CM | POA: Diagnosis present

## 2020-10-18 DIAGNOSIS — G9341 Metabolic encephalopathy: Secondary | ICD-10-CM | POA: Diagnosis not present

## 2020-10-18 DIAGNOSIS — K828 Other specified diseases of gallbladder: Secondary | ICD-10-CM | POA: Diagnosis not present

## 2020-10-18 DIAGNOSIS — R4182 Altered mental status, unspecified: Secondary | ICD-10-CM | POA: Diagnosis not present

## 2020-10-18 DIAGNOSIS — D472 Monoclonal gammopathy: Secondary | ICD-10-CM | POA: Diagnosis present

## 2020-10-18 DIAGNOSIS — A419 Sepsis, unspecified organism: Secondary | ICD-10-CM

## 2020-10-18 DIAGNOSIS — Z79899 Other long term (current) drug therapy: Secondary | ICD-10-CM | POA: Diagnosis not present

## 2020-10-18 DIAGNOSIS — R778 Other specified abnormalities of plasma proteins: Secondary | ICD-10-CM | POA: Diagnosis not present

## 2020-10-18 DIAGNOSIS — K8020 Calculus of gallbladder without cholecystitis without obstruction: Secondary | ICD-10-CM | POA: Diagnosis not present

## 2020-10-18 DIAGNOSIS — J90 Pleural effusion, not elsewhere classified: Secondary | ICD-10-CM | POA: Diagnosis not present

## 2020-10-18 DIAGNOSIS — Z882 Allergy status to sulfonamides status: Secondary | ICD-10-CM

## 2020-10-18 DIAGNOSIS — I10 Essential (primary) hypertension: Secondary | ICD-10-CM | POA: Diagnosis present

## 2020-10-18 DIAGNOSIS — R231 Pallor: Secondary | ICD-10-CM | POA: Diagnosis not present

## 2020-10-18 DIAGNOSIS — M47817 Spondylosis without myelopathy or radiculopathy, lumbosacral region: Secondary | ICD-10-CM | POA: Diagnosis not present

## 2020-10-18 DIAGNOSIS — M48061 Spinal stenosis, lumbar region without neurogenic claudication: Secondary | ICD-10-CM | POA: Diagnosis not present

## 2020-10-18 DIAGNOSIS — M87152 Osteonecrosis due to drugs, left femur: Secondary | ICD-10-CM | POA: Diagnosis not present

## 2020-10-18 DIAGNOSIS — S22009A Unspecified fracture of unspecified thoracic vertebra, initial encounter for closed fracture: Secondary | ICD-10-CM | POA: Diagnosis not present

## 2020-10-18 DIAGNOSIS — D62 Acute posthemorrhagic anemia: Secondary | ICD-10-CM | POA: Diagnosis not present

## 2020-10-18 DIAGNOSIS — K51919 Ulcerative colitis, unspecified with unspecified complications: Secondary | ICD-10-CM

## 2020-10-18 DIAGNOSIS — I5033 Acute on chronic diastolic (congestive) heart failure: Secondary | ICD-10-CM | POA: Diagnosis present

## 2020-10-18 DIAGNOSIS — T394X5A Adverse effect of antirheumatics, not elsewhere classified, initial encounter: Secondary | ICD-10-CM | POA: Diagnosis present

## 2020-10-18 DIAGNOSIS — R509 Fever, unspecified: Secondary | ICD-10-CM | POA: Diagnosis present

## 2020-10-18 DIAGNOSIS — Z20822 Contact with and (suspected) exposure to covid-19: Secondary | ICD-10-CM | POA: Diagnosis not present

## 2020-10-18 DIAGNOSIS — E871 Hypo-osmolality and hyponatremia: Secondary | ICD-10-CM | POA: Diagnosis present

## 2020-10-18 DIAGNOSIS — R0689 Other abnormalities of breathing: Secondary | ICD-10-CM | POA: Diagnosis not present

## 2020-10-18 DIAGNOSIS — R7303 Prediabetes: Secondary | ICD-10-CM | POA: Diagnosis not present

## 2020-10-18 DIAGNOSIS — I110 Hypertensive heart disease with heart failure: Secondary | ICD-10-CM | POA: Diagnosis not present

## 2020-10-18 DIAGNOSIS — A0472 Enterocolitis due to Clostridium difficile, not specified as recurrent: Principal | ICD-10-CM | POA: Diagnosis present

## 2020-10-18 DIAGNOSIS — Z6837 Body mass index (BMI) 37.0-37.9, adult: Secondary | ICD-10-CM | POA: Diagnosis not present

## 2020-10-18 DIAGNOSIS — M5127 Other intervertebral disc displacement, lumbosacral region: Secondary | ICD-10-CM | POA: Diagnosis not present

## 2020-10-18 DIAGNOSIS — E876 Hypokalemia: Secondary | ICD-10-CM | POA: Diagnosis present

## 2020-10-18 DIAGNOSIS — E872 Acidosis: Secondary | ICD-10-CM | POA: Diagnosis not present

## 2020-10-18 DIAGNOSIS — R000 Tachycardia, unspecified: Secondary | ICD-10-CM | POA: Diagnosis not present

## 2020-10-18 DIAGNOSIS — Z8249 Family history of ischemic heart disease and other diseases of the circulatory system: Secondary | ICD-10-CM | POA: Diagnosis not present

## 2020-10-18 DIAGNOSIS — M47816 Spondylosis without myelopathy or radiculopathy, lumbar region: Secondary | ICD-10-CM | POA: Diagnosis not present

## 2020-10-18 DIAGNOSIS — E669 Obesity, unspecified: Secondary | ICD-10-CM | POA: Diagnosis present

## 2020-10-18 DIAGNOSIS — Z888 Allergy status to other drugs, medicaments and biological substances status: Secondary | ICD-10-CM | POA: Diagnosis not present

## 2020-10-18 DIAGNOSIS — R0902 Hypoxemia: Secondary | ICD-10-CM | POA: Diagnosis not present

## 2020-10-18 DIAGNOSIS — J9811 Atelectasis: Secondary | ICD-10-CM | POA: Diagnosis not present

## 2020-10-18 LAB — CBC WITH DIFFERENTIAL/PLATELET
Abs Immature Granulocytes: 0.13 10*3/uL — ABNORMAL HIGH (ref 0.00–0.07)
Abs Immature Granulocytes: 0.1 10*3/uL — ABNORMAL HIGH (ref 0.00–0.07)
Basophils Absolute: 0 10*3/uL (ref 0.0–0.1)
Basophils Absolute: 0 10*3/uL (ref 0.0–0.1)
Basophils Relative: 0 %
Basophils Relative: 0 %
Eosinophils Absolute: 0 10*3/uL (ref 0.0–0.5)
Eosinophils Absolute: 0 10*3/uL (ref 0.0–0.5)
Eosinophils Relative: 0 %
Eosinophils Relative: 0 %
HCT: 22.3 % — ABNORMAL LOW (ref 39.0–52.0)
HCT: 18.7 % — ABNORMAL LOW (ref 39.0–52.0)
Hemoglobin: 7.7 g/dL — ABNORMAL LOW (ref 13.0–17.0)
Hemoglobin: 6.3 g/dL — CL (ref 13.0–17.0)
Immature Granulocytes: 1 %
Immature Granulocytes: 1 %
Lymphocytes Relative: 16 %
Lymphocytes Relative: 20 %
Lymphs Abs: 1.7 10*3/uL (ref 0.7–4.0)
Lymphs Abs: 1.8 10*3/uL (ref 0.7–4.0)
MCH: 33 pg (ref 26.0–34.0)
MCH: 32.5 pg (ref 26.0–34.0)
MCHC: 34.5 g/dL (ref 30.0–36.0)
MCHC: 33.7 g/dL (ref 30.0–36.0)
MCV: 95.7 fL (ref 80.0–100.0)
MCV: 96.4 fL (ref 80.0–100.0)
Monocytes Absolute: 0.8 10*3/uL (ref 0.1–1.0)
Monocytes Absolute: 0.8 10*3/uL (ref 0.1–1.0)
Monocytes Relative: 7 %
Monocytes Relative: 9 %
Neutro Abs: 8.3 10*3/uL — ABNORMAL HIGH (ref 1.7–7.7)
Neutro Abs: 6.5 10*3/uL (ref 1.7–7.7)
Neutrophils Relative %: 76 %
Neutrophils Relative %: 70 %
Platelets: 493 10*3/uL — ABNORMAL HIGH (ref 150–400)
Platelets: 406 10*3/uL — ABNORMAL HIGH (ref 150–400)
RBC: 2.33 MIL/uL — ABNORMAL LOW (ref 4.22–5.81)
RBC: 1.94 MIL/uL — ABNORMAL LOW (ref 4.22–5.81)
RDW: 13 % (ref 11.5–15.5)
RDW: 13.1 % (ref 11.5–15.5)
WBC: 10.9 10*3/uL — ABNORMAL HIGH (ref 4.0–10.5)
WBC: 9.2 10*3/uL (ref 4.0–10.5)
nRBC: 0 % (ref 0.0–0.2)
nRBC: 0 % (ref 0.0–0.2)

## 2020-10-18 LAB — URINALYSIS, ROUTINE W REFLEX MICROSCOPIC
Bilirubin Urine: NEGATIVE
Glucose, UA: NEGATIVE mg/dL
Hgb urine dipstick: NEGATIVE
Ketones, ur: NEGATIVE mg/dL
Leukocytes,Ua: NEGATIVE
Nitrite: NEGATIVE
Protein, ur: NEGATIVE mg/dL
Specific Gravity, Urine: <1.005 — ABNORMAL LOW (ref 1.005–1.030)
pH: 6 (ref 5.0–8.0)

## 2020-10-18 LAB — RESP PANEL BY RT-PCR (RSV, FLU A&B, COVID)  RVPGX2
Influenza A by PCR: NEGATIVE
Influenza B by PCR: NEGATIVE
Resp Syncytial Virus by PCR: NEGATIVE
SARS Coronavirus 2 by RT PCR: NEGATIVE

## 2020-10-18 LAB — DIC (DISSEMINATED INTRAVASCULAR COAGULATION)PANEL
D-Dimer, Quant: 2.31 ug/mL-FEU — ABNORMAL HIGH (ref 0.00–0.50)
Fibrinogen: 449 mg/dL (ref 210–475)
INR: 1.3 — ABNORMAL HIGH (ref 0.8–1.2)
Platelets: 442 10*3/uL — ABNORMAL HIGH (ref 150–400)
Prothrombin Time: 15.9 seconds — ABNORMAL HIGH (ref 11.4–15.2)
Smear Review: NONE SEEN
aPTT: 30 seconds (ref 24–36)

## 2020-10-18 LAB — IRON AND TIBC
Iron: 57 ug/dL (ref 45–182)
Saturation Ratios: 33 % (ref 17.9–39.5)
TIBC: 172 ug/dL — ABNORMAL LOW (ref 250–450)
UIBC: 115 ug/dL

## 2020-10-18 LAB — BLOOD GAS, VENOUS
Acid-Base Excess: 2.1 mmol/L — ABNORMAL HIGH (ref 0.0–2.0)
Bicarbonate: 26.3 mmol/L (ref 20.0–28.0)
O2 Saturation: 99.3 %
Patient temperature: 98.6
pCO2, Ven: 42.3 mmHg — ABNORMAL LOW (ref 44.0–60.0)
pH, Ven: 7.41 (ref 7.250–7.430)
pO2, Ven: 143 mmHg — ABNORMAL HIGH (ref 32.0–45.0)

## 2020-10-18 LAB — COMPREHENSIVE METABOLIC PANEL
ALT: 20 U/L (ref 0–44)
AST: 29 U/L (ref 15–41)
Albumin: 2.2 g/dL — ABNORMAL LOW (ref 3.5–5.0)
Alkaline Phosphatase: 172 U/L — ABNORMAL HIGH (ref 38–126)
Anion gap: 11 (ref 5–15)
BUN: 30 mg/dL — ABNORMAL HIGH (ref 6–20)
CO2: 27 mmol/L (ref 22–32)
Calcium: 7.5 mg/dL — ABNORMAL LOW (ref 8.9–10.3)
Chloride: 92 mmol/L — ABNORMAL LOW (ref 98–111)
Creatinine, Ser: 1.05 mg/dL (ref 0.61–1.24)
GFR, Estimated: >60 mL/min (ref >60)
Glucose, Bld: 188 mg/dL — ABNORMAL HIGH (ref 70–99)
Potassium: 3.5 mmol/L (ref 3.5–5.1)
Sodium: 130 mmol/L — ABNORMAL LOW (ref 135–145)
Total Bilirubin: 0.9 mg/dL (ref 0.3–1.2)
Total Protein: 5.5 g/dL — ABNORMAL LOW (ref 6.5–8.1)

## 2020-10-18 LAB — LACTATE DEHYDROGENASE: LDH: 106 U/L (ref 98–192)

## 2020-10-18 LAB — RESP PANEL BY RT-PCR (FLU A&B, COVID) ARPGX2
Influenza A by PCR: NEGATIVE
Influenza B by PCR: NEGATIVE
SARS Coronavirus 2 by RT PCR: NEGATIVE

## 2020-10-18 LAB — RETICULOCYTES
Immature Retic Fract: 32.6 % — ABNORMAL HIGH (ref 2.3–15.9)
RBC.: 1.93 MIL/uL — ABNORMAL LOW (ref 4.22–5.81)
Retic Count, Absolute: 51.7 10*3/uL (ref 19.0–186.0)
Retic Ct Pct: 2.7 % (ref 0.4–3.1)

## 2020-10-18 LAB — TROPONIN I (HIGH SENSITIVITY)
Troponin I (High Sensitivity): 8 ng/L (ref <18)
Troponin I (High Sensitivity): 7 ng/L (ref <18)

## 2020-10-18 LAB — LACTIC ACID, PLASMA
Lactic Acid, Venous: 2.4 mmol/L (ref 0.5–1.9)
Lactic Acid, Venous: 0.9 mmol/L (ref 0.5–1.9)

## 2020-10-18 LAB — APTT: aPTT: 37 seconds — ABNORMAL HIGH (ref 24–36)

## 2020-10-18 LAB — FERRITIN: Ferritin: 958 ng/mL — ABNORMAL HIGH (ref 24–336)

## 2020-10-18 LAB — FOLATE: Folate: 9.7 ng/mL (ref >5.9)

## 2020-10-18 LAB — AMMONIA: Ammonia: 23 umol/L (ref 9–35)

## 2020-10-18 LAB — PROCALCITONIN: Procalcitonin: 3.63 ng/mL

## 2020-10-18 LAB — C-REACTIVE PROTEIN: CRP: 15.3 mg/dL — ABNORMAL HIGH (ref <1.0)

## 2020-10-18 LAB — CK: Total CK: 22 U/L — ABNORMAL LOW (ref 49–397)

## 2020-10-18 LAB — PROTIME-INR
INR: 1.2 (ref 0.8–1.2)
Prothrombin Time: 15.4 seconds — ABNORMAL HIGH (ref 11.4–15.2)

## 2020-10-18 LAB — MAGNESIUM: Magnesium: 1.9 mg/dL (ref 1.7–2.4)

## 2020-10-18 LAB — VITAMIN B12: Vitamin B-12: 990 pg/mL — ABNORMAL HIGH (ref 180–914)

## 2020-10-18 LAB — SEDIMENTATION RATE: Sed Rate: 100 mm/hr — ABNORMAL HIGH (ref 0–16)

## 2020-10-18 LAB — D-DIMER, QUANTITATIVE: D-Dimer, Quant: 3.9 ug/mL-FEU — ABNORMAL HIGH (ref 0.00–0.50)

## 2020-10-18 LAB — PHOSPHORUS: Phosphorus: 3.5 mg/dL (ref 2.5–4.6)

## 2020-10-18 MED ORDER — MAGNESIUM SULFATE IN D5W 1-5 GM/100ML-% IV SOLN
1.0000 g | Freq: Once | INTRAVENOUS | Status: AC
  Administered 2020-10-18: 1 g via INTRAVENOUS
  Filled 2020-10-18: qty 100

## 2020-10-18 MED ORDER — IOHEXOL 350 MG/ML SOLN
80.0000 mL | Freq: Once | INTRAVENOUS | Status: AC | PRN
  Administered 2020-10-18: 100 mL via INTRAVENOUS

## 2020-10-18 MED ORDER — METRONIDAZOLE 500 MG/100ML IV SOLN
500.0000 mg | Freq: Two times a day (BID) | INTRAVENOUS | Status: DC
Start: 2020-10-19 — End: 2020-10-18

## 2020-10-18 MED ORDER — SODIUM CHLORIDE 0.9 % IV SOLN
2.0000 g | Freq: Once | INTRAVENOUS | Status: AC
  Administered 2020-10-18: 2 g via INTRAVENOUS
  Filled 2020-10-18: qty 2

## 2020-10-18 MED ORDER — LACTATED RINGERS IV BOLUS (SEPSIS)
500.0000 mL | Freq: Once | INTRAVENOUS | Status: AC
  Administered 2020-10-18: 500 mL via INTRAVENOUS

## 2020-10-18 MED ORDER — FUROSEMIDE 10 MG/ML IJ SOLN
40.0000 mg | Freq: Once | INTRAMUSCULAR | Status: AC
  Administered 2020-10-18: 40 mg via INTRAVENOUS
  Filled 2020-10-18: qty 4

## 2020-10-18 MED ORDER — SODIUM CHLORIDE 0.9% IV SOLUTION: Freq: Once | INTRAVENOUS | Status: AC

## 2020-10-18 MED ORDER — POTASSIUM CHLORIDE 10 MEQ/100ML IV SOLN
10.0000 meq | INTRAVENOUS | Status: AC
  Administered 2020-10-19 (×4): 10 meq via INTRAVENOUS
  Filled 2020-10-18 (×4): qty 100

## 2020-10-18 MED ORDER — POLYETHYLENE GLYCOL 3350 17 G PO PACK: 17.0000 g | PACK | Freq: Every day | ORAL | Status: DC | PRN

## 2020-10-18 MED ORDER — PANTOPRAZOLE SODIUM 40 MG IV SOLR
40.0000 mg | Freq: Once | INTRAVENOUS | Status: AC
  Administered 2020-10-18: 40 mg via INTRAVENOUS
  Filled 2020-10-18: qty 40

## 2020-10-18 MED ORDER — METHYLPREDNISOLONE SODIUM SUCC 125 MG IJ SOLR: 120.0000 mg | INTRAMUSCULAR | Status: DC

## 2020-10-18 MED ORDER — VANCOMYCIN HCL IN DEXTROSE 1-5 GM/200ML-% IV SOLN: 1000.0000 mg | Freq: Once | INTRAVENOUS | Status: DC

## 2020-10-18 MED ORDER — METHYLPREDNISOLONE SODIUM SUCC 125 MG IJ SOLR
60.0000 mg | Freq: Once | INTRAMUSCULAR | Status: AC
  Administered 2020-10-18: 60 mg via INTRAVENOUS
  Filled 2020-10-18: qty 2

## 2020-10-18 MED ORDER — PANTOPRAZOLE 80MG IVPB - SIMPLE MED
80.0000 mg | Freq: Once | INTRAVENOUS | Status: AC
  Administered 2020-10-18: 80 mg via INTRAVENOUS
  Filled 2020-10-18: qty 80

## 2020-10-18 MED ORDER — LACTATED RINGERS IV BOLUS (SEPSIS)
1000.0000 mL | Freq: Once | INTRAVENOUS | Status: AC
  Administered 2020-10-18: 1000 mL via INTRAVENOUS

## 2020-10-18 MED ORDER — SODIUM CHLORIDE 0.9 % IV SOLN
10.0000 mL/h | Freq: Once | INTRAVENOUS | Status: AC
  Administered 2020-10-18: 10 mL/h via INTRAVENOUS

## 2020-10-18 MED ORDER — DOCUSATE SODIUM 100 MG PO CAPS: 100.0000 mg | ORAL_CAPSULE | Freq: Two times a day (BID) | ORAL | Status: DC | PRN

## 2020-10-18 MED ORDER — ONDANSETRON HCL 4 MG/2ML IJ SOLN: 4.0000 mg | Freq: Four times a day (QID) | INTRAMUSCULAR | Status: DC | PRN

## 2020-10-18 MED ORDER — LACTATED RINGERS IV SOLN: INTRAVENOUS | Status: DC

## 2020-10-18 MED ORDER — PANTOPRAZOLE INFUSION (NEW) - SIMPLE MED
8.0000 mg/h | INTRAVENOUS | Status: DC
  Administered 2020-10-18 – 2020-10-19 (×2): 8 mg/h via INTRAVENOUS
  Filled 2020-10-18: qty 100
  Filled 2020-10-18: qty 80

## 2020-10-18 MED ORDER — PANTOPRAZOLE SODIUM 40 MG IV SOLR: 40.0000 mg | Freq: Two times a day (BID) | INTRAVENOUS | Status: DC

## 2020-10-18 MED ORDER — VANCOMYCIN HCL 1750 MG/350ML IV SOLN: 1750.0000 mg | INTRAVENOUS | Status: DC

## 2020-10-18 MED ORDER — METRONIDAZOLE 500 MG/100ML IV SOLN
500.0000 mg | Freq: Once | INTRAVENOUS | Status: AC
  Administered 2020-10-18: 500 mg via INTRAVENOUS
  Filled 2020-10-18: qty 100

## 2020-10-18 MED ORDER — VANCOMYCIN HCL 2000 MG/400ML IV SOLN
2000.0000 mg | Freq: Once | INTRAVENOUS | Status: AC
  Administered 2020-10-18: 2000 mg via INTRAVENOUS
  Filled 2020-10-18: qty 400

## 2020-10-18 MED ORDER — SODIUM CHLORIDE 0.9 % IV SOLN
2.0000 g | Freq: Three times a day (TID) | INTRAVENOUS | Status: DC
  Administered 2020-10-18: 2 g via INTRAVENOUS
  Filled 2020-10-18: qty 2

## 2020-10-18 MED ORDER — FUROSEMIDE 10 MG/ML IJ SOLN: 40.0000 mg | Freq: Once | INTRAMUSCULAR | Status: DC

## 2020-10-18 NOTE — Progress Notes (Signed)
A consult was received from an ED physician for vancomycin and cefepime per pharmacy dosing.  The patient's profile has been reviewed for ht/wt/allergies/indication/available labs.   A one time order has been placed for vancomycin 2045m IV x1 and cefepime 2g IV x1.  Further antibiotics/pharmacy consults should be ordered by admitting physician if indicated.                       Thank you,  MDimple Nanas PharmD 10/18/2020 3:49 PM

## 2020-10-18 NOTE — ED Triage Notes (Signed)
Ems brings pt in from home for GI bleeding. Pt reports dark tarry stool.

## 2020-10-18 NOTE — Sepsis Progress Note (Signed)
Secure chat with bedside nurse regarding LA that was drawn at 1838. Lab shows still in process. She is drawing another LA now.

## 2020-10-18 NOTE — H&P (Addendum)
NAME:  Austin Chang MRN:  [id number] DOB:  02-Aug-1965 LOS: 0 ADMISSION DATE:  10/18/2020 DATE OF SERVICE:  10/18/2020  CHIEF COMPLAINT:  recurrent melena   HISTORY & PHYSICAL  History of Present Illness  This 55 y.o. obese Caucasian male presented to the [facility] Emergency Department via EMS with complaints of recurrent melena. He had 3 bouts of massive bloody bowel movements at home today associated with decreased mentation.  The patient was discharged from [facility] on 9/19 (yesterday) after completing a 7+ day course of antibiotic therapy for treatment fever of unknown origin (which included Rocephin/azithromycin, cefepime/Flagyl/doxycycline/vancomycin) with elevated inflammatory markers and procalcitonin.  Blood cultures were negative.  He had extensive evaluation for infection, which was inconclusive.  He was unable to tolerate MRI of the lumbar spine (CT was negative).  The patient's fever was noted to abate after discontinuation of cefepime.  Of note, the patient has ulcerative colitis and ankylosing spondylitis and reports taking Humira.  REVIEW OF SYSTEMS Constitutional: Fever.  Lethargy has improved with treatment in the ER. No weight loss. No night sweats. No chills. No fatigue. HEENT: No headaches, dysphagia, sore throat, otalgia, nasal congestion, PND CV:  Lower extremity edema.  No chest pain, orthopnea, PND, palpitations GI:  Mild nausea.  Melena x 3.  No abdominal pain, vomiting, diarrhea, change in bowel pattern, anorexia Resp: No DOE, rest dyspnea, cough, mucus, hemoptysis, wheezing  GU: no dysuria, change in color of urine, no urgency or frequency.  No flank pain. MS:  No joint pain or swelling. No myalgias,  No decreased range of motion.  Psych:  No change in mood or affect. No memory loss. Skin: no rash or lesions.   Past Medical/Surgical/Social/Family History   Past Medical History:  Diagnosis Date   Ankylosing spondylitis ([place])     Arthritis    Colitis    Colitis    Diverticulosis    Hypertension    Obesity    Rectal bleeding     Past Surgical History:  Procedure Laterality Date   COLON SURGERY     COLONOSCOPY WITH PROPOFOL N/A 07/11/2016   Procedure: COLONOSCOPY WITH PROPOFOL;  Surgeon: Garlan Fair, MD;  Location: [facility] ENDOSCOPY;  Service: Endoscopy;  Laterality: N/A;   COLOSTOMY CLOSURE     ESOPHAGOGASTRODUODENOSCOPY  01/03/2011   Procedure: ESOPHAGOGASTRODUODENOSCOPY (EGD);  Surgeon: Garlan Fair, MD;  Location: [facility];  Service: Endoscopy;  Laterality: N/A;   NECK SURGERY     SPINE SURGERY     TONSILLECTOMY      Social History   Tobacco Use   Smoking status: Never   Smokeless tobacco: Never  Substance Use Topics   Alcohol use: Yes    Alcohol/week: 5.0 standard drinks    Types: 5 Standard drinks or equivalent per week    Comment: weekly    Family History  Problem Relation Age of Onset   Heart disease Mother    Heart disease Father    Anesthesia problems Neg Hx    Hypotension Neg Hx    Malignant hyperthermia Neg Hx    Pseudochol deficiency Neg Hx    Colon cancer Neg Hx    Rectal cancer Neg Hx    Esophageal cancer Neg Hx    Liver cancer Neg Hx     Procedures:    Significant Diagnostic Tests:    Micro Data:   Results for orders placed or performed during the hospital encounter of 10/18/20  Resp Panel  by RT-PCR (Flu A&B, Covid) Nasopharyngeal Swab     Status: None   Collection Time: 10/18/20  6:36 PM   Specimen: Nasopharyngeal Swab; Nasopharyngeal(NP) swabs in vial transport medium  Result Value Ref Range Status   SARS Coronavirus 2 by RT PCR NEGATIVE NEGATIVE Final    Comment: (NOTE) SARS-CoV-2 target nucleic acids are NOT DETECTED.  The SARS-CoV-2 RNA is generally detectable in upper respiratory specimens during the acute phase of infection. The lowest concentration of SARS-CoV-2 viral copies this assay can detect is 138 copies/mL. A negative result does not  preclude SARS-Cov-2 infection and should not be used as the sole basis for treatment or other patient management decisions. A negative result may occur with  improper specimen collection/handling, submission of specimen other than nasopharyngeal swab, presence of viral mutation(s) within the areas targeted by this assay, and inadequate number of viral copies(<138 copies/mL). A negative result must be combined with clinical observations, patient history, and epidemiological information. The expected result is Negative.  Fact Sheet for Patients:  EntrepreneurPulse.com.au  Fact Sheet for Healthcare Providers:  IncredibleEmployment.be  This test is no t yet approved or cleared by the [place] FDA and  has been authorized for detection and/or diagnosis of SARS-CoV-2 by FDA under an Emergency Use Authorization (EUA). This EUA will remain  in effect (meaning this test can be used) for the duration of the COVID-19 declaration under Section 564(b)(1) of the Act, 21 U.S.C.section 360bbb-3(b)(1), unless the authorization is terminated  or revoked sooner.       Influenza A by PCR NEGATIVE NEGATIVE Final   Influenza B by PCR NEGATIVE NEGATIVE Final    Comment: (NOTE) The Xpert Xpress SARS-CoV-2/FLU/RSV plus assay is intended as an aid in the diagnosis of influenza from Nasopharyngeal swab specimens and should not be used as a sole basis for treatment. Nasal washings and aspirates are unacceptable for Xpert Xpress SARS-CoV-2/FLU/RSV testing.  Fact Sheet for Patients: EntrepreneurPulse.com.au  Fact Sheet for Healthcare Providers: IncredibleEmployment.be  This test is not yet approved or cleared by the [place] FDA and has been authorized for detection and/or diagnosis of SARS-CoV-2 by FDA under an Emergency Use Authorization (EUA). This EUA will remain in effect (meaning this test can be used) for the  duration of the COVID-19 declaration under Section 564(b)(1) of the Act, 21 U.S.C. section 360bbb-3(b)(1), unless the authorization is terminated or revoked.  Performed at [facility], [place] [address], [place], [place] [address]       Antimicrobials:  Cefepime/Flagyl/vancomycin (again, 9/20)  Previous antibiotics: Azithromycin x1 dose 9/8  Ceftriaxone x1 dose 9/8 Doxycycline 9/10-9/11 Vancomycin 9/8-9/9 Metronidazole 9/8-9/10; 9/12-9/13 Cefepime 9/8-9/15   Interim history/subjective:    Objective   BP 108/74   Pulse 96   Temp 97.6 F (36.4 C) (Oral)   Resp 20   Ht 5' 8" (1.727 m)   Wt 111.1 kg   SpO2 100%   BMI 37.25 kg/m     Filed Weights   10/18/20 1533  Weight: 111.1 kg   No intake or output data in the 24 hours ending 10/18/20 2152      Examination: GENERAL:  alert, oriented to time, person and place, pleasant, well-developed. No acute distress.  Pale. HEAD: normocephalic, atraumatic EYE: Conjunctival pallor.  PERRLA, EOM intact, no scleral icterus. NOSE: nares are patent. No polyps. No exudate. No sinus tenderness. THROAT/ORAL CAVITY: Normal dentition. No oral thrush. No exudate. Mucous membranes are moist. No tonsillar enlargement.  NECK: supple, no thyromegaly, no  JVD, no lymphadenopathy. Trachea midline. CHEST/LUNG: symmetric in development and expansion. Good air entry. Dependent crackles. No wheezes. HEART: Regular S1 and S2 without murmur, rub or gallop. ABDOMEN: soft, nontender, distended. Hyperactive bowel sounds. No rebound. No guarding. No hepatosplenomegaly. EXTREMITIES: Edema: 3+. No cyanosis. No clubbing. 2+ DP pulses LYMPHATIC: no cervical/axillary/inguinal lymph nodes appreciated MUSCULOSKELETAL: No point tenderness. No bulk atrophy. Joints: normal inspection.  SKIN:  No rash or lesion. NEUROLOGIC: Doll's eyes intact. Corneal reflex intact. Spontaneous respirations intact. Cranial nerves II-XII are grossly symmetric  and physiologic. Babinski absent. No sensory deficit. Motor: 5/5 @ RUE, 5/5 @ LUE, 5/5 @ RLL,  5/5 @ LLL.  DTR: 2+ @ R biceps, 2+ @ L biceps, 2+ @ R patellar,  2+ @ L patellar. No cerebellar signs. Gait was not assessed.   Resolved Hospital Problem list   Acute metabolic encephalopathy   Assessment & Plan:   ASSESSMENT/PLAN:  ASSESSMENT (included in the Hospital Problem List)  Principal Problem:   GI bleed Active Problems:   Melena   Ulcerative colitis ([place])   Acute blood loss anemia   Hyponatremia   FUO (fever of unknown origin)   Essential hypertension   Hypokalemia   By systems: PULMONARY Acute pulmonary edema Left pleural effusion, small Diurese Supplemental oxygen as needed to maintain SpO2 93+%   CARDIOVASCULAR Essential hypertension Prolonged QTc on EKG Hemodynamic monitoring per ICU protocol Check ionized calcium Hold home antihypertensives for now, as he is normotensive   RENAL Hyponatremia Hypokalemia Check BNP Replete potassium Foley catheter insertion for strict I/O Monitor urine output   GASTROINTESTINAL GI bleed with recurrent melena Ulcerative colitis with stigmata of severe disease NPO for now Consult GI in am. Continue Protonix gtt for now.  Awaiting GI input. Start SoluMedrol 60 mg IV single dose now followed by 40 mg IV q 8 hr Agree with checking C diff toxin assay GI PROPHYLAXIS: Protonix   HEMATOLOGIC Acute blood loss anemia Elevated ESR Transfuse DVT PROPHYLAXIS: SCDs only for now   INFECTIOUS Fever of unknown origin Completed a 7+ day course of antibiotics yesterday.  Hold antibiotics for now. Monitor fever curve U/A was bland, sterile Check blood cultures   ENDOCRINE Hyperglycemia with elevated lactate (without hypotension) Check HgbA1c   NEUROLOGIC Acute metabolic encephalopathy has resolved.   PLAN/RECOMMENDATIONS  Admit to ICU under my service (Attending: Renee Pain, MD) with the diagnoses highlighted above in  the active Hospital Problem List (ASSESSMENT). IV fluids: hold maintenance IV fluids for now Transfuse PRBC 2 units NUTRITION: NPO for now    My assessment, plan of care, findings, medications, side effects, etc. were discussed with: nurse, patient (answered all questions to patient's satisfaction), and patient's next of kin (answered all questions to his/her satisfaction).   Best practice:  Diet: NPO Pain/Anxiety/Delirium protocol (if indicated): N/A VAP protocol (if indicated): N/A DVT prophylaxis: SCDs GI prophylaxis: Protonix Glucose control: N/A. Check HgbA1c. Mobility/Activity: up to commode with assistance   Code Status: Full Code Family Communication:  patient's family (wife updated at bedside) Disposition: admit to ICU   Labs   CBC: Recent Labs  Lab 10/15/20 0336 10/16/20 0347 10/17/20 0317 10/18/20 1537 10/18/20 2058  WBC 14.0* 13.9* 13.8* 10.9* 9.2  NEUTROABS 12.0* 11.7* 11.6* 8.3* 6.5  HGB 11.1* 10.1* 9.9* 7.7* 6.3*  HCT 30.7* 29.0* 28.1* 22.3* 18.7*  MCV 91.9 92.7 93.0 95.7 96.4  PLT 381 416* 447* 493* 406*    Basic Metabolic Panel: Recent Labs  Lab 10/15/20 0336 10/15/20 0828 10/16/20  7035 10/17/20 0317 10/17/20 1634 10/18/20 1537 10/18/20 2058  NA 124*  --  125* 127* 130* 130*  --   K 3.4*  --  3.2* 3.3* 3.6 3.5  --   CL 85*  --  86* 88* 90* 92*  --   CO2 29  --  _0 --   GLUCOSE 122*  --  126* 120* 132* 188*  --   BUN 16  --  _1 30*  --   CREATININE 1.01  --  0.96 0.91 1.03 1.05  --   CALCIUM 8.0*  --  8.1* 8.4* 8.3* 7.5*  --   MG  --  2.0  --   --   --   --  1.9  PHOS  --   --   --   --   --   --  3.5   GFR: Estimated Creatinine Clearance: 93.9 mL/min (by C-G formula based on SCr of 1.05 mg/dL). Recent Labs  Lab 10/12/20 1049 10/13/20 0334 10/14/20 0158 10/15/20 0336 10/16/20 0347 10/17/20 0317 10/18/20 1537 10/18/20 2058  PROCALCITON 6.61 6.83 6.79  --   --   --   --   --   WBC  --  14.7* 16.9*   < > 13.9* 13.8*  10.9* 9.2  LATICACIDVEN  --   --   --   --   --   --  2.4*  --    < > = values in this interval not displayed.    Liver Function Tests: Recent Labs  Lab 10/12/20 1049 10/18/20 1537  AST 43* 29  ALT 27 20  ALKPHOS 204* 172*  BILITOT 1.3* 0.9  PROT 5.4* 5.5*  ALBUMIN 2.1* 2.2*   No results for input(s): LIPASE, AMYLASE in the last 168 hours. No results for input(s): AMMONIA in the last 168 hours.  ABG    Component Value Date/Time   HCO3 26.3 10/18/2020 1939   O2SAT 99.3 10/18/2020 1939     Coagulation Profile: Recent Labs  Lab 10/18/20 1537  INR 1.2    Cardiac Enzymes: Recent Labs  Lab 10/18/20 2058  CKTOTAL 22*    HbA1C: No results found for: HGBA1C  CBG: No results for input(s): GLUCAP in the last 168 hours.   Past Medical History   Past Medical History:  Diagnosis Date   Ankylosing spondylitis ([place])    Arthritis    Colitis    Colitis    Diverticulosis    Hypertension    Obesity    Rectal bleeding       Surgical History    Past Surgical History:  Procedure Laterality Date   COLON SURGERY     COLONOSCOPY WITH PROPOFOL N/A 07/11/2016   Procedure: COLONOSCOPY WITH PROPOFOL;  Surgeon: Garlan Fair, MD;  Location: [facility];  Service: Endoscopy;  Laterality: N/A;   COLOSTOMY CLOSURE     ESOPHAGOGASTRODUODENOSCOPY  01/03/2011   Procedure: ESOPHAGOGASTRODUODENOSCOPY (EGD);  Surgeon: Garlan Fair, MD;  Location: [facility];  Service: Endoscopy;  Laterality: N/A;   NECK SURGERY     SPINE SURGERY     TONSILLECTOMY        Social History   Social History   Socioeconomic History   Marital status: Married    Spouse name: Nikolaj Geraghty   Number of children: 2   Years of education: Not on file   Highest education level: Not on file  Occupational History   Occupation: Warden/ranger  Tobacco  Use   Smoking status: Never   Smokeless tobacco: Never  Vaping Use   Vaping Use: Never used  Substance and Sexual Activity    Alcohol use: Yes    Alcohol/week: 5.0 standard drinks    Types: 5 Standard drinks or equivalent per week    Comment: weekly   Drug use: No   Sexual activity: Not on file  Other Topics Concern   Not on file  Social History Narrative   Not on file   Social Determinants of Health   Financial Resource Strain: Not on file  Food Insecurity: Not on file  Transportation Needs: Not on file  Physical Activity: Not on file  Stress: Not on file  Social Connections: Not on file      Family History    Family History  Problem Relation Age of Onset   Heart disease Mother    Heart disease Father    Anesthesia problems Neg Hx    Hypotension Neg Hx    Malignant hyperthermia Neg Hx    Pseudochol deficiency Neg Hx    Colon cancer Neg Hx    Rectal cancer Neg Hx    Esophageal cancer Neg Hx    Liver cancer Neg Hx    family history includes Heart disease in his father and mother. There is no history of Anesthesia problems, Hypotension, Malignant hyperthermia, Pseudochol deficiency, Colon cancer, Rectal cancer, Esophageal cancer, or Liver cancer.    Allergies Allergies  Allergen Reactions   Septra [Bactrim] Other (See Comments)    Very bad migraine   Sulfamethoxazole-Trimethoprim     Other reaction(s): massive headache      Current Medications  Current Facility-Administered Medications:    lactated ringers infusion, , Intravenous, Continuous, Regan Lemming, MD, Last Rate: 150 mL/hr at 10/18/20 1941, New Bag at 10/18/20 1941   [START ON 10/19/2020] metroNIDAZOLE (FLAGYL) IVPB 500 mg, 500 mg, Intravenous, Q12H, Doutova, Anastassia, MD   pantoprazole (PROTONIX) 80 mg /NS 100 mL IVPB, 80 mg, Intravenous, Once, Doutova, Anastassia, MD   [START ON 10/22/2020] pantoprazole (PROTONIX) injection 40 mg, 40 mg, Intravenous, Q12H, Doutova, Anastassia, MD   pantoprozole (PROTONIX) 80 mg /NS 100 mL infusion, 8 mg/hr, Intravenous, Continuous, Doutova, Anastassia, MD  Current Outpatient Medications:     acetaminophen (TYLENOL) 500 MG tablet, Take 500-1,000 mg by mouth every 6 (six) hours as needed for mild pain., Disp: , Rfl:    allopurinol (ZYLOPRIM) 300 MG tablet, Take 300 mg by mouth daily., Disp: , Rfl:    celecoxib (CELEBREX) 200 MG capsule, Take 1 capsule (200 mg total) by mouth 2 (two) times daily as needed., Disp: , Rfl:    Cholecalciferol (VITAMIN D) 2000 units CAPS, Take 2,000 Units by mouth daily., Disp: , Rfl:    Colchicine 0.6 MG CAPS, Take 0.6 mg by mouth daily as needed (gout flare up)., Disp: , Rfl:    furosemide (LASIX) 40 MG tablet, Take 1 tablet (40 mg total) by mouth 2 (two) times daily., Disp: 60 tablet, Rfl: 0   HUMIRA PEN 40 MG/0.4ML PNKT, 40 mg by Subdermal route every 14 (fourteen) days., Disp: , Rfl:    indomethacin (INDOCIN SR) 75 MG CR capsule, Take 75 mg by mouth daily as needed for mild pain. For inflammation., Disp: , Rfl:    indomethacin (INDOCIN) 50 MG capsule, Take 50 mg by mouth 2 (two) times daily with a meal., Disp: , Rfl:    Magnesium 300 MG CAPS, Take 300 mg by mouth daily., Disp: , Rfl:  ondansetron (ZOFRAN-ODT) 8 MG disintegrating tablet, Take 8 mg by mouth every 8 (eight) hours as needed for nausea/vomiting., Disp: , Rfl:    Potassium 99 MG TABS, Take 1 tablet by mouth daily., Disp: , Rfl:    thiamine (VITAMIN B-1) 100 MG tablet, Take 100 mg by mouth daily., Disp: , Rfl:    valsartan (DIOVAN) 160 MG tablet, Take 1 tablet (160 mg total) by mouth daily., Disp: 30 tablet, Rfl: 0   zolpidem (AMBIEN) 10 MG tablet, Take 10 mg by mouth at bedtime as needed for sleep., Disp: , Rfl:    Home Medications  Prior to Admission medications   Medication Sig Start Date End Date Taking? Authorizing Provider  acetaminophen (TYLENOL) 500 MG tablet Take 500-1,000 mg by mouth every 6 (six) hours as needed for mild pain.   Yes [provider]  allopurinol (ZYLOPRIM) 300 MG tablet Take 300 mg by mouth daily. 11/04/19  Yes [provider]  celecoxib  (CELEBREX) 200 MG capsule Take 1 capsule (200 mg total) by mouth 2 (two) times daily as needed. 10/17/20  Yes Alma Friendly, MD  Cholecalciferol (VITAMIN D) 2000 units CAPS Take 2,000 Units by mouth daily.   Yes [provider]  Colchicine 0.6 MG CAPS Take 0.6 mg by mouth daily as needed (gout flare up). 08/24/19  Yes [provider]  furosemide (LASIX) 40 MG tablet Take 1 tablet (40 mg total) by mouth 2 (two) times daily. 10/17/20 11/16/20 Yes Alma Friendly, MD  HUMIRA PEN 40 MG/0.4ML PNKT 40 mg by Subdermal route every 14 (fourteen) days. 11/25/19  Yes [provider]  indomethacin (INDOCIN SR) 75 MG CR capsule Take 75 mg by mouth daily as needed for mild pain. For inflammation.   Yes [provider]  indomethacin (INDOCIN) 50 MG capsule Take 50 mg by mouth 2 (two) times daily with a meal.   Yes [provider]  Magnesium 300 MG CAPS Take 300 mg by mouth daily.   Yes [provider]  ondansetron (ZOFRAN-ODT) 8 MG disintegrating tablet Take 8 mg by mouth every 8 (eight) hours as needed for nausea/vomiting. 01/28/20  Yes [provider]  Potassium 99 MG TABS Take 1 tablet by mouth daily.   Yes [provider]  thiamine (VITAMIN B-1) 100 MG tablet Take 100 mg by mouth daily.   Yes [provider]  valsartan (DIOVAN) 160 MG tablet Take 1 tablet (160 mg total) by mouth daily. 10/17/20 11/16/20 Yes Alma Friendly, MD  zolpidem (AMBIEN) 10 MG tablet Take 10 mg by mouth at bedtime as needed for sleep. 01/20/20  Yes [provider]      Critical care time: 45 minutes.  The treatment and management of the patient's condition was required based on the threat of imminent deterioration. This time reflects time spent by the physician evaluating, providing care and managing the critically ill patient's care. The time was spent at the immediate bedside (or on the same floor/unit and dedicated to this  patient's care). Time involved in separately billable procedures is NOT included int he critical care time indicated above. Family meeting and update time may be included above if and only if the patient is unable/incompetent to participate in clinical interview and/or decision making, and the discussion was necessary to determining treatment decisions.   Renee Pain, MD Board Certified by the ABIM, [place]

## 2020-10-18 NOTE — ED Provider Notes (Signed)
[facility] DEPT Provider Note   CSN: [id number] Arrival date & time: 10/18/20  1446     History Chief Complaint  Patient presents with   GI Problem    Austin Chang is a 55 y.o. male.   GI Problem Pertinent negatives include no chest pain, no abdominal pain and no shortness of breath.   55 year old male with medical history significant for ulcerative colitis on Humira, hypertension, ankylosing spondylitis, recent hospital admission from 9/8 to 10/17/2020 for fever of unknown origin, presenting with concern for sepsis in addition to concern for GI bleed with new onset melena.  The patient underwent extensive work-up while inpatient during his last admission.  Blood cultures x2 revealed no growth to date.  CT of the chest, abdomen and pelvis had been unremarkable.  ID had been consulted and had recommended MRI imaging of the patient's spine due to pain and fever of unknown origin.  Patient has been unable to tolerate MRI imaging even with sedation per documentation.  CT imaging of the spine had not been able to document any evidence of infection.  Patient completed a 7-day course of antibiotics and was discharged with plan to follow-up with his PCP.  He presents back to the emergency department today after discharge yesterday with concern for acute GI bleed.  The patient endorses multiple episodes of dark tarry stool earlier today.  Additionally on arrival, the patient was septic with fever and tachycardia noted.  The patient notes that he has been incontinent of both urine and stool over the last 24 hours.  He denies any perineal numbness or lower extremity weakness.  When his wife arrived to the emergency department, she stated that the patient had not in fact had any episodes of incontinence but had had multiple dark tarry bowel movements.  She states that he has been urinating normally.  He arrived GCS 15, ABC intact, with a normal neurologic exam.  Past  Medical History:  Diagnosis Date   Ankylosing spondylitis ([place])    Arthritis    Colitis    Colitis    Diverticulosis    Hypertension    Obesity    Rectal bleeding     Patient Active Problem List   Diagnosis Date Noted   C. difficile colitis    Melena 10/18/2020   GI bleed 10/18/2020   FUO (fever of unknown origin) 10/18/2020   Ulcerative colitis ([place]) 10/18/2020   Hypokalemia 10/18/2020   Acute blood loss anemia 10/18/2020   Bilateral pneumonia 10/07/2020   Sepsis ([place]) 10/06/2020   Essential hypertension 01/31/2020   Inflammatory bowel disease 01/31/2020   Acute respiratory failure with hypoxia ([place]) 01/31/2020   Hyponatremia 01/31/2020   Pneumonia due to COVID-19 virus 01/30/2020    Past Surgical History:  Procedure Laterality Date   COLON SURGERY     COLONOSCOPY WITH PROPOFOL N/A 07/11/2016   Procedure: COLONOSCOPY WITH PROPOFOL;  Surgeon: Garlan Fair, MD;  Location: [facility]COPY;  Service: Endoscopy;  Laterality: N/A;   COLOSTOMY CLOSURE     ESOPHAGOGASTRODUODENOSCOPY  01/03/2011   Procedure: ESOPHAGOGASTRODUODENOSCOPY (EGD);  Surgeon: Garlan Fair, MD;  Location: [facility];  Service: Endoscopy;  Laterality: N/A;   NECK SURGERY     SPINE SURGERY     TONSILLECTOMY         Family History  Problem Relation Age of Onset   Heart disease Mother    Heart disease Father    Anesthesia problems Neg Hx    Hypotension Neg  Hx    Malignant hyperthermia Neg Hx    Pseudochol deficiency Neg Hx    Colon cancer Neg Hx    Rectal cancer Neg Hx    Esophageal cancer Neg Hx    Liver cancer Neg Hx     Social History   Tobacco Use   Smoking status: Never   Smokeless tobacco: Never  Vaping Use   Vaping Use: Never used  Substance Use Topics   Alcohol use: Yes    Alcohol/week: 5.0 standard drinks    Types: 5 Standard drinks or equivalent per week    Comment: weekly   Drug use: No    Home Medications Prior to Admission medications   Medication Sig Start  Date End Date Taking? Authorizing Provider  acetaminophen (TYLENOL) 500 MG tablet Take 500-1,000 mg by mouth every 6 (six) hours as needed for mild pain.   Yes [provider]  allopurinol (ZYLOPRIM) 300 MG tablet Take 300 mg by mouth daily. 11/04/19  Yes [provider]  celecoxib (CELEBREX) 200 MG capsule Take 1 capsule (200 mg total) by mouth 2 (two) times daily as needed. 10/17/20  Yes Alma Friendly, MD  Cholecalciferol (VITAMIN D) 2000 units CAPS Take 2,000 Units by mouth daily.   Yes [provider]  Colchicine 0.6 MG CAPS Take 0.6 mg by mouth daily as needed (gout flare up). 08/24/19  Yes [provider]  furosemide (LASIX) 40 MG tablet Take 1 tablet (40 mg total) by mouth 2 (two) times daily. 10/17/20 11/16/20 Yes Alma Friendly, MD  HUMIRA PEN 40 MG/0.4ML PNKT 40 mg by Subdermal route every 14 (fourteen) days. 11/25/19  Yes [provider]  indomethacin (INDOCIN SR) 75 MG CR capsule Take 75 mg by mouth daily as needed for mild pain. For inflammation.   Yes [provider]  indomethacin (INDOCIN) 50 MG capsule Take 50 mg by mouth 2 (two) times daily with a meal.   Yes [provider]  Magnesium 300 MG CAPS Take 300 mg by mouth daily.   Yes [provider]  ondansetron (ZOFRAN-ODT) 8 MG disintegrating tablet Take 8 mg by mouth every 8 (eight) hours as needed for nausea/vomiting. 01/28/20  Yes [provider]  Potassium 99 MG TABS Take 1 tablet by mouth daily.   Yes [provider]  thiamine (VITAMIN B-1) 100 MG tablet Take 100 mg by mouth daily.   Yes [provider]  valsartan (DIOVAN) 160 MG tablet Take 1 tablet (160 mg total) by mouth daily. 10/17/20 11/16/20 Yes Alma Friendly, MD  zolpidem (AMBIEN) 10 MG tablet Take 10 mg by mouth at bedtime as needed for sleep. 01/20/20  Yes [provider]    Allergies    Septra [bactrim] and  Sulfamethoxazole-trimethoprim  Review of Systems   Review of Systems  Constitutional:  Positive for chills and fever.  HENT:  Negative for ear pain and sore throat.   Eyes:  Negative for pain and visual disturbance.  Respiratory:  Negative for cough and shortness of breath.   Cardiovascular:  Negative for chest pain and palpitations.  Gastrointestinal:  Positive for blood in stool. Negative for abdominal pain and vomiting.  Genitourinary:  Negative for dysuria.  Musculoskeletal:  Positive for back pain. Negative for arthralgias.  Skin:  Negative for color change and rash.  Neurological:  Negative for seizures and syncope.  All other systems reviewed and are negative.  Physical Exam Updated Vital Signs BP 128/76   Pulse (!) 101  Temp 98.1 F (36.7 C) (Oral)   Resp (!) 29   Ht 5' 8"  (1.727 m)   Wt 111.2 kg   SpO2 99%   BMI 37.28 kg/m   Physical Exam Vitals and nursing note reviewed. Exam conducted with a chaperone present.  Constitutional:      General: He is not in acute distress.    Appearance: He is obese. He is ill-appearing and diaphoretic.  HENT:     Head: Normocephalic and atraumatic.  Eyes:     Conjunctiva/sclera: Conjunctivae normal.     Pupils: Pupils are equal, round, and reactive to light.  Cardiovascular:     Rate and Rhythm: Regular rhythm. Tachycardia present.     Pulses: Normal pulses.  Pulmonary:     Effort: Pulmonary effort is normal. No respiratory distress.  Abdominal:     General: There is distension.     Tenderness: There is abdominal tenderness. There is no guarding.  Genitourinary:    Comments: Gross melena noted, poor rectal tone also noted Musculoskeletal:        General: No deformity or signs of injury.     Cervical back: Normal range of motion and neck supple.  Skin:    Findings: No lesion or rash.  Neurological:     General: No focal deficit present.     Mental Status: He is alert. Mental status is at baseline.     Cranial Nerves:  No cranial nerve deficit.     Sensory: No sensory deficit.     Motor: No weakness.     Coordination: Coordination normal.    ED Results / Procedures / Treatments   Labs (all labs ordered are listed, but only abnormal results are displayed) Labs Reviewed  C DIFFICILE QUICK SCREEN W PCR REFLEX   - Abnormal; Notable for the following components:      Result Value   C Diff antigen POSITIVE (*)    C Diff toxin POSITIVE (*)    All other components within normal limits  LACTIC ACID, PLASMA - Abnormal; Notable for the following components:   Lactic Acid, Venous 2.4 (*)    All other components within normal limits  COMPREHENSIVE METABOLIC PANEL - Abnormal; Notable for the following components:   Sodium 130 (*)    Chloride 92 (*)    Glucose, Bld 188 (*)    BUN 30 (*)    Calcium 7.5 (*)    Total Protein 5.5 (*)    Albumin 2.2 (*)    Alkaline Phosphatase 172 (*)    All other components within normal limits  CBC WITH DIFFERENTIAL/PLATELET - Abnormal; Notable for the following components:   WBC 10.9 (*)    RBC 2.33 (*)    Hemoglobin 7.7 (*)    HCT 22.3 (*)    Platelets 493 (*)    Neutro Abs 8.3 (*)    Abs Immature Granulocytes 0.13 (*)    All other components within normal limits  PROTIME-INR - Abnormal; Notable for the following components:   Prothrombin Time 15.4 (*)    All other components within normal limits  APTT - Abnormal; Notable for the following components:   aPTT 37 (*)    All other components within normal limits  URINALYSIS, ROUTINE W REFLEX MICROSCOPIC - Abnormal; Notable for the following components:   Specific Gravity, Urine <1.005 (*)    All other components within normal limits  D-DIMER, QUANTITATIVE - Abnormal; Notable for the following components:   D-Dimer, Quant 3.90 (*)  All other components within normal limits  SEDIMENTATION RATE - Abnormal; Notable for the following components:   Sed Rate 100 (*)    All other components within normal limits   C-REACTIVE PROTEIN - Abnormal; Notable for the following components:   CRP 15.3 (*)    All other components within normal limits  BLOOD GAS, VENOUS - Abnormal; Notable for the following components:   pCO2, Ven 42.3 (*)    pO2, Ven 143.0 (*)    Acid-Base Excess 2.1 (*)    All other components within normal limits  CBC WITH DIFFERENTIAL/PLATELET - Abnormal; Notable for the following components:   RBC 1.94 (*)    Hemoglobin 6.3 (*)    HCT 18.7 (*)    Platelets 406 (*)    Abs Immature Granulocytes 0.10 (*)    All other components within normal limits  CK - Abnormal; Notable for the following components:   Total CK 22 (*)    All other components within normal limits  VITAMIN B12 - Abnormal; Notable for the following components:   Vitamin B-12 990 (*)    All other components within normal limits  IRON AND TIBC - Abnormal; Notable for the following components:   TIBC 172 (*)    All other components within normal limits  FERRITIN - Abnormal; Notable for the following components:   Ferritin 958 (*)    All other components within normal limits  RETICULOCYTES - Abnormal; Notable for the following components:   RBC. 1.93 (*)    Immature Retic Fract 32.6 (*)    All other components within normal limits  PREALBUMIN - Abnormal; Notable for the following components:   Prealbumin 7.2 (*)    All other components within normal limits  DIC (DISSEMINATED INTRAVASCULAR COAGULATION)PANEL - Abnormal; Notable for the following components:   Prothrombin Time 15.9 (*)    INR 1.3 (*)    D-Dimer, Quant 2.31 (*)    Platelets 442 (*)    All other components within normal limits  CBC - Abnormal; Notable for the following components:   RBC 2.28 (*)    Hemoglobin 7.6 (*)    HCT 21.5 (*)    Platelets 477 (*)    All other components within normal limits  BASIC METABOLIC PANEL - Abnormal; Notable for the following components:   Sodium 134 (*)    Glucose, Bld 149 (*)    Calcium 8.0 (*)    All other  components within normal limits  HEMOGLOBIN A1C - Abnormal; Notable for the following components:   Hgb A1c MFr Bld 6.4 (*)    All other components within normal limits  CBC - Abnormal; Notable for the following components:   RBC 2.19 (*)    Hemoglobin 7.3 (*)    HCT 20.8 (*)    Platelets 450 (*)    All other components within normal limits  CBC - Abnormal; Notable for the following components:   RBC 2.28 (*)    Hemoglobin 7.4 (*)    HCT 21.8 (*)    Platelets 442 (*)    All other components within normal limits  RESP PANEL BY RT-PCR (FLU A&B, COVID) ARPGX2  CULTURE, BLOOD (ROUTINE X 2)  CULTURE, BLOOD (ROUTINE X 2)  RESP PANEL BY RT-PCR (RSV, FLU A&B, COVID)  RVPGX2  CULTURE, BLOOD (ROUTINE X 2)  CULTURE, BLOOD (ROUTINE X 2)  MRSA NEXT GEN BY PCR, NASAL  URINE CULTURE  LACTIC ACID, PLASMA  MAGNESIUM  PHOSPHORUS  FOLATE  LACTIC ACID,  PLASMA  PROCALCITONIN  LACTATE DEHYDROGENASE  AMMONIA  MAGNESIUM  BRAIN NATRIURETIC PEPTIDE  CALCIUM, IONIZED  CBC  TYPE AND SCREEN  PREPARE RBC (CROSSMATCH)  TROPONIN I (HIGH SENSITIVITY)  TROPONIN I (HIGH SENSITIVITY)    EKG EKG Interpretation  Date/Time:  Tuesday October 18 2020 16:12:26 EDT Ventricular Rate:  110 PR Interval:  137 QRS Duration: 94 QT Interval:  371 QTC Calculation: 502 R Axis:   90 Text Interpretation: Sinus tachycardia Paired ventricular premature complexes Borderline right axis deviation Low voltage, precordial leads Minimal ST elevation, inferior leads Prolonged QT interval Confirmed by Regan Lemming ([id number]) on 10/18/2020 5:46:47 PM  Radiology CT HEAD WO CONTRAST (5MM)  Result Date: 10/18/2020 CLINICAL DATA:  Mental status change EXAM: CT HEAD WITHOUT CONTRAST TECHNIQUE: Contiguous axial images were obtained from the base of the skull through the vertex without intravenous contrast. COMPARISON:  None. FINDINGS: Brain: No evidence of acute infarction, hemorrhage, cerebral edema, mass, mass effect, or midline  shift. Ventricles and sulci are within normal limits for age. No extra-axial fluid collection. Periventricular white matter changes, likely the sequela of chronic small vessel ischemic disease. Vascular: No hyperdense vessel or unexpected calcification. Skull: Normal. Negative for fracture or focal lesion. Sinuses/Orbits: Air-fluid levels in the maxillary sinuses with bubbly fluid. Trace mucosal thickening in the right anterior ethmoid air cells. Thickening of the walls of the maxillary sinus, as can be seen with a history of chronic sinusitis. Normal orbits. Other: Fluid in the left greater than right mastoid air cells. IMPRESSION: 1. No acute intracranial process. 2. Air-fluid levels with bubbly fluid in the bilateral maxillary sinuses, as can be seen in setting of acute sinusitis. Correlate with symptoms. Electronically Signed   By: Merilyn Baba M.D.   On: 10/18/2020 19:53   CT CHEST ABDOMEN PELVIS W CONTRAST  Result Date: 10/18/2020 CLINICAL DATA:  Sepsis Unclear source of infection, concern for epidural abscess but patient refusing MRI EXAM: CT CHEST, ABDOMEN, AND PELVIS WITH CONTRAST TECHNIQUE: Multidetector CT imaging of the chest, abdomen and pelvis was performed following the standard protocol during bolus administration of intravenous contrast. CONTRAST:  121m OMNIPAQUE IOHEXOL 350 MG/ML SOLN COMPARISON:  Abdominopelvic CT 10/10/2020, chest CTA 10/06/2020 FINDINGS: CT CHEST FINDINGS Cardiovascular: Normal caliber thoracic aorta. Aortic atherosclerosis. No acute aortic findings. Heart is normal in size. There are coronary artery calcifications. No pericardial effusion. No central pulmonary emboli on this non CTA exam. No pericardial effusion. Mediastinum/Nodes: No enlarged mediastinal or hilar lymph nodes. No axillary adenopathy. No esophageal wall thickening. No thyroid nodule. Lungs/Pleura: Breathing motion artifact. Scattered areas of subsegmental atelectasis. No confluent airspace disease or  pneumonia. Trace left pleural effusion/thickening. Findings of pulmonary edema. Pulmonary mass. Musculoskeletal: Thoracic spine better assessed on concurrent thoracic spine CT, reported separately. No findings of extra-spinal musculoskeletal infection. No chest wall soft tissue collection or abnormality. CT ABDOMEN PELVIS FINDINGS Hepatobiliary: Again seen hepatic steatosis without focal hepatic abnormality. No intrahepatic collection. No biliary dilatation. Multiple gallstones in the gallbladder. Equivocal gallbladder wall thickening, limited due to motion. No visualized choledocholithiasis. Pancreas: No ductal dilatation or inflammation. Spleen: Upper normal in size without focal abnormality. Adrenals/Urinary Tract: No adrenal nodule. Cortical scarring in the mid right kidney. No hydronephrosis. Homogeneous enhancement with symmetric excretion on delayed phase imaging. No evidence of focal renal lesion or stone. Bladder previously deviating into the left pelvis is not demonstrated on the current exam. There is no bladder wall thickening. Stomach/Bowel: Scattered air and fluid in the colon without wall thickening or colonic  inflammation. The appendix is not seen. Majority of small bowel is decompressed. No small bowel inflammation. Few small bowel loops opposed the left anterolateral abdominal wall suggesting adhesions, but no obstruction. Vascular/Lymphatic: Patent portal vein. Aortic atherosclerosis. Scattered central mesenteric and retroperitoneal lymph nodes, not enlarged by size criteria. Small calcified lymph nodes in the porta hepatis. No acute vascular findings Reproductive: Prostate is unremarkable. Other: No free air or ascites. Postsurgical change of the anterior abdominal wall. Mild body wall edema which is more prominent in the flanks, and increased on the left compared to right. Musculoskeletal: Lumbar spine assessed on concurrent lumbar spine CT, reported separately. Avascular necrosis of both  femoral heads. Partial fusion of the sacroiliac joints consistent with history of ankylosing spondylitis. Ossification in the left gluteal medius small cells likely related to remote injury. No evidence of intramuscular collection. No hip joint effusion or findings of musculoskeletal infection. IMPRESSION: 1. Cholelithiasis. Equivocal gallbladder wall thickening, detailed evaluation limited due to motion. 2. Occasional pulmonary atelectasis. No pneumonia or evidence of intrathoracic infection. 3. Mild body wall edema which is more prominent in the flanks, and increased on the left compared to right. This may be due to third-spacing. 4. Hepatic steatosis. 5. 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 6. Please reference thoracic and lumbar spine CT reported separately for spinal assessment. Aortic Atherosclerosis (ICD10-I70.0). Electronically Signed   By: Keith Rake M.D.   On: 10/18/2020 20:00   CT T-SPINE NO CHARGE  Result Date: 10/18/2020 CLINICAL DATA:  Initial evaluation for sepsis, concern for epidural abscess. EXAM: CT THORACIC AND LUMBAR SPINE WITHOUT CONTRAST TECHNIQUE: Multidetector CT imaging of the thoracic and lumbar spine was performed without contrast. Multiplanar CT image reconstructions were also generated. COMPARISON:  None. FINDINGS: CT THORACIC SPINE FINDINGS Alignment: Physiologic with preservation of the normal thoracic kyphosis. No listhesis. Vertebrae: Diffusely flowing syndesmophytes seen throughout the thoracic spine with fusion of the posterior elements, consistent with ankylosing spondylitis. Vertebral body height maintained with no visible acute or chronic fracture. No discrete or worrisome osseous lesions. Postsurgical changes partially visualized at the cervicothoracic junction. No visible hardware complication. Paraspinal and other soft tissues: Paraspinous soft tissues demonstrate no acute finding. Chronic fatty atrophy noted throughout the posterior  paraspinous musculature. Mild atelectatic changes noted dependently within the visualized lungs. Scattered coronary artery calcifications with aortic atherosclerosis noted. Disc levels: No significant disc pathology seen within the thoracic spine. No significant stenosis or neural impingement. No findings to suggest osteomyelitis discitis or septic arthritis. No visible epidural abscess or other collection. CT LUMBAR SPINE FINDINGS Segmentation: Standard. Lowest well-formed disc space labeled the L5-S1 level. Alignment: Mild exaggeration of the normal lumbar lordosis. No listhesis. Vertebrae: Vertebral body height maintained without acute or chronic fracture. Diffusely flowing syndesmophytes seen throughout the lumbar spine with ankylosis/fusion of the SI joints bilaterally, consistent with ankylosing spondylitis. No discrete or worrisome osseous lesions. No findings to suggest osteomyelitis discitis or septic arthritis. No discrete or worrisome osseous lesions. Paraspinal and other soft tissues: Paraspinous soft tissues demonstrate no acute finding. Chronic fatty atrophy noted within the posterior paraspinous musculature. Aortic atherosclerosis noted. Disc levels: L1-2: Negative interspace. Mild to moderate facet hypertrophy. No stenosis. L2-3: Negative interspace. Mild to moderate facet hypertrophy, greater on the right. No stenosis. L3-4: Negative interspace. Mild to moderate facet hypertrophy. No significant stenosis. L4-5: Negative interspace. Moderate to advanced bilateral facet arthrosis. No significant spinal stenosis. Mild to moderate bilateral L4 foraminal narrowing. L5-S1: Disc desiccation with mild disc bulge. Severe bilateral facet  arthrosis. No significant spinal stenosis. Moderate bilateral L5 foraminal narrowing. IMPRESSION: 1. No CT evidence for acute infection within the thoracolumbar spine. No visible epidural abscess or other collection. 2. Findings consistent with ankylosing spondylitis. 3.  Moderate to advanced facet arthropathy at L4-5 and L5-S1 with associated moderate bilateral L4 and L5 foraminal stenosis. 4. Aortic Atherosclerosis (ICD10-I70.0). Electronically Signed   By: Jeannine Boga M.D.   On: 10/18/2020 20:04   CT L-SPINE NO CHARGE  Result Date: 10/18/2020 CLINICAL DATA:  Initial evaluation for sepsis, concern for epidural abscess. EXAM: CT THORACIC AND LUMBAR SPINE WITHOUT CONTRAST TECHNIQUE: Multidetector CT imaging of the thoracic and lumbar spine was performed without contrast. Multiplanar CT image reconstructions were also generated. COMPARISON:  None. FINDINGS: CT THORACIC SPINE FINDINGS Alignment: Physiologic with preservation of the normal thoracic kyphosis. No listhesis. Vertebrae: Diffusely flowing syndesmophytes seen throughout the thoracic spine with fusion of the posterior elements, consistent with ankylosing spondylitis. Vertebral body height maintained with no visible acute or chronic fracture. No discrete or worrisome osseous lesions. Postsurgical changes partially visualized at the cervicothoracic junction. No visible hardware complication. Paraspinal and other soft tissues: Paraspinous soft tissues demonstrate no acute finding. Chronic fatty atrophy noted throughout the posterior paraspinous musculature. Mild atelectatic changes noted dependently within the visualized lungs. Scattered coronary artery calcifications with aortic atherosclerosis noted. Disc levels: No significant disc pathology seen within the thoracic spine. No significant stenosis or neural impingement. No findings to suggest osteomyelitis discitis or septic arthritis. No visible epidural abscess or other collection. CT LUMBAR SPINE FINDINGS Segmentation: Standard. Lowest well-formed disc space labeled the L5-S1 level. Alignment: Mild exaggeration of the normal lumbar lordosis. No listhesis. Vertebrae: Vertebral body height maintained without acute or chronic fracture. Diffusely flowing  syndesmophytes seen throughout the lumbar spine with ankylosis/fusion of the SI joints bilaterally, consistent with ankylosing spondylitis. No discrete or worrisome osseous lesions. No findings to suggest osteomyelitis discitis or septic arthritis. No discrete or worrisome osseous lesions. Paraspinal and other soft tissues: Paraspinous soft tissues demonstrate no acute finding. Chronic fatty atrophy noted within the posterior paraspinous musculature. Aortic atherosclerosis noted. Disc levels: L1-2: Negative interspace. Mild to moderate facet hypertrophy. No stenosis. L2-3: Negative interspace. Mild to moderate facet hypertrophy, greater on the right. No stenosis. L3-4: Negative interspace. Mild to moderate facet hypertrophy. No significant stenosis. L4-5: Negative interspace. Moderate to advanced bilateral facet arthrosis. No significant spinal stenosis. Mild to moderate bilateral L4 foraminal narrowing. L5-S1: Disc desiccation with mild disc bulge. Severe bilateral facet arthrosis. No significant spinal stenosis. Moderate bilateral L5 foraminal narrowing. IMPRESSION: 1. No CT evidence for acute infection within the thoracolumbar spine. No visible epidural abscess or other collection. 2. Findings consistent with ankylosing spondylitis. 3. Moderate to advanced facet arthropathy at L4-5 and L5-S1 with associated moderate bilateral L4 and L5 foraminal stenosis. 4. Aortic Atherosclerosis (ICD10-I70.0). Electronically Signed   By: Jeannine Boga M.D.   On: 10/18/2020 20:04   DG Chest Port 1 View  Result Date: 10/18/2020 CLINICAL DATA:  Questionable sepsis. EXAM: PORTABLE CHEST 1 VIEW COMPARISON:  Chest x-ray 10/12/2020. FINDINGS: The cardiac silhouette appears enlarged, unchanged. There is central pulmonary vascular congestion. There is no lung consolidation. There is likely a small left pleural effusion. There is no pneumothorax or acute fracture. IMPRESSION: 1. Cardiomegaly with central pulmonary vascular  congestion. 2. Small left pleural effusion. Electronically Signed   By: Ronney Asters M.D.   On: 10/18/2020 16:31    Procedures .Critical Care Performed by: Regan Lemming, MD Authorized by: Armandina Gemma,  Jeneen Rinks, MD   Critical care provider statement:    Critical care time (minutes):  74   Critical care was necessary to treat or prevent imminent or life-threatening deterioration of the following conditions:  Sepsis and circulatory failure   Critical care was time spent personally by me on the following activities:  Discussions with consultants, evaluation of patient's response to treatment, examination of patient, ordering and performing treatments and interventions, ordering and review of laboratory studies, ordering and review of radiographic studies, pulse oximetry, re-evaluation of patient's condition, obtaining history from patient or surrogate and review of old charts   Care discussed with: admitting provider     Medications Ordered in ED Medications  ondansetron (ZOFRAN) injection 4 mg (has no administration in time range)  0.9 %  sodium chloride infusion (Manually program via Guardrails IV Fluids) ( Intravenous Not Given 10/18/20 2317)  Chlorhexidine Gluconate Cloth 2 % PADS 6 each (6 each Topical Given 10/19/20 0744)  fidaxomicin (DIFICID) tablet 200 mg (200 mg Oral Given 10/19/20 1040)  morphine 2 MG/ML injection 2 mg (2 mg Intravenous Given 10/19/20 1056)  lactated ringers bolus 1,000 mL (0 mLs Intravenous Stopped 10/18/20 1819)    And  lactated ringers bolus 1,000 mL (0 mLs Intravenous Stopped 10/18/20 1819)    And  lactated ringers bolus 500 mL (0 mLs Intravenous Stopped 10/18/20 2000)  ceFEPIme (MAXIPIME) 2 g in sodium chloride 0.9 % 100 mL IVPB (0 g Intravenous Stopped 10/18/20 1647)  metroNIDAZOLE (FLAGYL) IVPB 500 mg (0 mg Intravenous Stopped 10/18/20 2045)  vancomycin (VANCOREADY) IVPB 2000 mg/400 mL (0 mg Intravenous Stopped 10/18/20 1856)  pantoprazole (PROTONIX) injection 40 mg (40  mg Intravenous Given 10/18/20 1608)  iohexol (OMNIPAQUE) 350 MG/ML injection 80 mL (100 mLs Intravenous Contrast Given 10/18/20 1854)  0.9 %  sodium chloride infusion (Manually program via Guardrails IV Fluids) (0 mLs Intravenous Stopped 10/19/20 0717)  pantoprazole (PROTONIX) 80 mg /NS 100 mL IVPB (0 mg Intravenous Stopped 10/18/20 2248)  0.9 %  sodium chloride infusion (0 mL/hr Intravenous Stopped 10/19/20 0030)  magnesium sulfate IVPB 1 g 100 mL (0 g Intravenous Stopped 10/19/20 0042)  potassium chloride 10 mEq in 100 mL IVPB (0 mEq Intravenous Stopped 10/19/20 0514)  furosemide (LASIX) injection 40 mg (40 mg Intravenous Given 10/18/20 2333)  methylPREDNISolone sodium succinate (SOLU-MEDROL) 125 mg/2 mL injection 60 mg (60 mg Intravenous Given 10/18/20 2334)    ED Course  I have reviewed the triage vital signs and the nursing notes.  Pertinent labs & imaging results that were available during my care of the patient were reviewed by me and considered in my medical decision making (see chart for details).    MDM Rules/Calculators/A&P                           55 year old male with medical history significant for ulcerative colitis on Humira, hypertension, ankylosing spondylitis, recent hospital admission from 9/8 to 10/17/2020 for fever of unknown origin, presenting with concern for sepsis in addition to concern for GI bleed with new onset melena.  The patient underwent extensive work-up while inpatient during his last admission.  Blood cultures x2 revealed no growth to date.  CT of the chest, abdomen and pelvis had been unremarkable.  ID had been consulted and had recommended MRI imaging of the patient's spine due to pain and fever of unknown origin.  Patient has been unable to tolerate MRI imaging even with sedation per documentation.  CT  imaging of the spine had not been able to document any evidence of infection.  Patient completed a 7-day course of antibiotics and was discharged with plan to  follow-up with his PCP.  He presents back to the emergency department today after discharge yesterday with concern for acute GI bleed.  The patient endorses multiple episodes of dark tarry stool earlier today.  Additionally on arrival, the patient was septic with fever and tachycardia noted.  The patient notes that he has been incontinent of both urine and stool over the last 24 hours.  He denies any perineal numbness or lower extremity weakness.  When his wife arrived to the emergency department, she stated that the patient had not in fact had any episodes of incontinence but had had multiple dark tarry bowel movements.  She states that he has been urinating normally.  He arrived GCS 15, ABC intact, with a normal neurologic exam.  The patient has a very broad differential diagnosis.  His initial presentation was concerning for a dual diagnosis of acute GI bleed with multiple large melanotic stools following his recent hospitalization and discharged yesterday.  Additional concern for sepsis as the patient arrived febrile and tachycardic in the setting of recent fever of unknown origin and unclear etiology.  The patient has had multiple courses of antibiotics recently.  He has had an extensive work-up via CT chest abdomen pelvis during his last admission which did not reveal source of his fevers.  He is on immunosuppression with Humira for ankylosing spondylitis.  He appears to have been lost to follow-up with GI regarding his ulcerative colitis as I do not see a recent visit since 2018 with low [facility].  Differential includes COVID-19, viral infection, pneumonia, intra-abdominal infection, urinary tract infection/urosepsis, skin infection/necrotizing soft tissue infection, epidural abscess, osteomyelitis, discitis, less likely meningitis or encephalitis given the patient's mental status, lack of meningeal findings on exam.  I have performed a thorough skin exam with no evidence for necrotizing soft tissue  infection or cellulitis.  His abdomen is mildly distended and mildly tender which is nonspecific.  Code sepsis was administered on arrival and the patient was administered broad-spectrum antibiotics and IV fluids given his hemodynamic instability with significant tachycardia and mildly soft pressures.  The patient's wife did express concern for volume overload as he was administered fluids throughout his last hospitalization.  He does appear to be third spacing his fluids which could be due from prior fluid administration, new onset heart failure, hypoalbuminemia or development of distributive shock.  Additionally, C. difficile colitis remains on the differential given his recent antibiotic administration and abdominal discomfort.  Concern for active GI bleed with multiple episodes of melena and clear melena noted on exam.  Explained to the patient and his wife that he is currently critically ill.  He was started on IV Protonix in the emergency department.  Initial hemoglobin revealed a two-point drop to 7.7.  The patient was typed and screened for blood product resuscitation.  Gastroenterology was consulted for emergent scope.  Given his current hemodynamic stability, will plan for endoscopy in the a.m.  Plan for emergent endoscopy in the event of hemodynamic instability.  I initially consulted hospitalist medicine for admission given the patient's reassuring vitals, however a repeat CBC revealed a continued drop in his hemoglobin to 6.3.  Concern for ongoing active GI bleed given the fact that his hemoglobin continues to drop.  He had no further melanotic bowel movements in the emergency department and remained hemodynamically stable,  however given concern for active GI bleed, critical care was consulted for admission.  DIC panel was collected and resulted negative for DIC.  CT of the chest abdomen pelvis with T and L reformats did not reveal any evidence of infection in the vertebral spine.  The patient remains  neurologically intact with no weakness and no perineal loss of sensation.  He retains control over his bowel and bladder function.  I have lower concern for epidural abscess at this time and do not think emergent MRI is indicated.   COVID-19 and influenza PCR testing resulted negative.  C. difficile antigen and toxin was ultimately collected and resulted positive.  The patient's findings are likely consistent with C. difficile colitis and GI bleed.  Plan for 2 unit PRBC resuscitation given the patient's acute anemia and critical care admission.  The patient was subsequently admitted in guarded condition.   Final Clinical Impression(s) / ED Diagnoses Final diagnoses:  Melena  Sepsis, due to unspecified organism, unspecified whether acute organ dysfunction present ([place])  Sepsis ([place])  C. difficile colitis    Rx / DC Orders ED Discharge Orders     None        Regan Lemming, MD 10/19/20 1219

## 2020-10-18 NOTE — ED Notes (Signed)
Condom cath placed, will give urine sample when able to.

## 2020-10-18 NOTE — Progress Notes (Signed)
Pharmacy Antibiotic Note  Austin Chang is a 55 y.o. male admitted on 10/18/2020 with medical history significant for ulcerative colitis on Humira, hypertension, ankylosing spondylitis, recent hospital admission from 9/8 to 10/17/2020 for fever of unknown origin, presenting with concern for sepsis in addition to concern for GI bleed with new onset melena.  Pharmacy has been consulted for vancomycin and cefepime dosing.  Plan: Cefepime 2gm IV q8h Vancomycin 1736m IV q24h  (AUC 472.1, Scr 1.05) Follow renal function and clinical course, vanc levels as needed  Height: 5' 8"  (172.7 cm) Weight: 111.1 kg (245 lb) IBW/kg (Calculated) : 68.4  Temp (24hrs), Avg:99.1 F (37.3 C), Min:97.6 F (36.4 C), Max:100.6 F (38.1 C)  Recent Labs  Lab 10/15/20 0336 10/16/20 0347 10/17/20 0317 10/17/20 1634 10/18/20 1537 10/18/20 2058  WBC 14.0* 13.9* 13.8*  --  10.9* 9.2  CREATININE 1.01 0.96 0.91 1.03 1.05  --   LATICACIDVEN  --   --   --   --  2.4*  --     Estimated Creatinine Clearance: 93.9 mL/min (by C-G formula based on SCr of 1.05 mg/dL).    Allergies  Allergen Reactions   Septra [Bactrim] Other (See Comments)    Very bad migraine   Sulfamethoxazole-Trimethoprim     Other reaction(s): massive headache     Thank you for allowing pharmacy to be a part of this patient's care.  EDolly RiasRPh 10/18/2020, 10:11 PM

## 2020-10-18 NOTE — Sepsis Progress Note (Signed)
Elink is following this code sepsis ?

## 2020-10-19 ENCOUNTER — Other Ambulatory Visit: Payer: Self-pay

## 2020-10-19 ENCOUNTER — Other Ambulatory Visit (HOSPITAL_COMMUNITY): Payer: Self-pay

## 2020-10-19 ENCOUNTER — Encounter (HOSPITAL_COMMUNITY): Payer: Self-pay | Admitting: Pulmonary Disease

## 2020-10-19 DIAGNOSIS — A0472 Enterocolitis due to Clostridium difficile, not specified as recurrent: Secondary | ICD-10-CM

## 2020-10-19 DIAGNOSIS — G9341 Metabolic encephalopathy: Secondary | ICD-10-CM | POA: Diagnosis not present

## 2020-10-19 DIAGNOSIS — R509 Fever, unspecified: Secondary | ICD-10-CM | POA: Diagnosis not present

## 2020-10-19 DIAGNOSIS — K922 Gastrointestinal hemorrhage, unspecified: Secondary | ICD-10-CM | POA: Diagnosis not present

## 2020-10-19 DIAGNOSIS — A419 Sepsis, unspecified organism: Secondary | ICD-10-CM

## 2020-10-19 LAB — BASIC METABOLIC PANEL
Anion gap: 6 (ref 5–15)
BUN: 19 mg/dL (ref 6–20)
CO2: 30 mmol/L (ref 22–32)
Calcium: 8 mg/dL — ABNORMAL LOW (ref 8.9–10.3)
Chloride: 98 mmol/L (ref 98–111)
Creatinine, Ser: 0.74 mg/dL (ref 0.61–1.24)
GFR, Estimated: >60 mL/min (ref >60)
Glucose, Bld: 149 mg/dL — ABNORMAL HIGH (ref 70–99)
Potassium: 3.7 mmol/L (ref 3.5–5.1)
Sodium: 134 mmol/L — ABNORMAL LOW (ref 135–145)

## 2020-10-19 LAB — CBC
HCT: 20.6 % — ABNORMAL LOW (ref 39.0–52.0)
HCT: 20.8 % — ABNORMAL LOW (ref 39.0–52.0)
HCT: 21.5 % — ABNORMAL LOW (ref 39.0–52.0)
HCT: 21.8 % — ABNORMAL LOW (ref 39.0–52.0)
Hemoglobin: 7.1 g/dL — ABNORMAL LOW (ref 13.0–17.0)
Hemoglobin: 7.3 g/dL — ABNORMAL LOW (ref 13.0–17.0)
Hemoglobin: 7.4 g/dL — ABNORMAL LOW (ref 13.0–17.0)
Hemoglobin: 7.6 g/dL — ABNORMAL LOW (ref 13.0–17.0)
MCH: 32.5 pg (ref 26.0–34.0)
MCH: 32.9 pg (ref 26.0–34.0)
MCH: 33.3 pg (ref 26.0–34.0)
MCH: 33.3 pg (ref 26.0–34.0)
MCHC: 33.9 g/dL (ref 30.0–36.0)
MCHC: 34.5 g/dL (ref 30.0–36.0)
MCHC: 35.1 g/dL (ref 30.0–36.0)
MCHC: 35.3 g/dL (ref 30.0–36.0)
MCV: 94.3 fL (ref 80.0–100.0)
MCV: 95 fL (ref 80.0–100.0)
MCV: 95.4 fL (ref 80.0–100.0)
MCV: 95.6 fL (ref 80.0–100.0)
Platelets: 442 10*3/uL — ABNORMAL HIGH (ref 150–400)
Platelets: 450 10*3/uL — ABNORMAL HIGH (ref 150–400)
Platelets: 477 10*3/uL — ABNORMAL HIGH (ref 150–400)
Platelets: 507 10*3/uL — ABNORMAL HIGH (ref 150–400)
RBC: 2.16 MIL/uL — ABNORMAL LOW (ref 4.22–5.81)
RBC: 2.19 MIL/uL — ABNORMAL LOW (ref 4.22–5.81)
RBC: 2.28 MIL/uL — ABNORMAL LOW (ref 4.22–5.81)
RBC: 2.28 MIL/uL — ABNORMAL LOW (ref 4.22–5.81)
RDW: 12.8 % (ref 11.5–15.5)
RDW: 13 % (ref 11.5–15.5)
RDW: 13 % (ref 11.5–15.5)
RDW: 13.1 % (ref 11.5–15.5)
WBC: 10 10*3/uL (ref 4.0–10.5)
WBC: 10.1 10*3/uL (ref 4.0–10.5)
WBC: 8.2 10*3/uL (ref 4.0–10.5)
WBC: 8.3 10*3/uL (ref 4.0–10.5)
nRBC: 0 % (ref 0.0–0.2)
nRBC: 0 % (ref 0.0–0.2)
nRBC: 0 % (ref 0.0–0.2)
nRBC: 0 % (ref 0.0–0.2)

## 2020-10-19 LAB — PREALBUMIN: Prealbumin: 7.2 mg/dL — ABNORMAL LOW (ref 18–38)

## 2020-10-19 LAB — IMMUNOFIXATION ELECTROPHORESIS
IgA: 331 mg/dL (ref 90–386)
IgG (Immunoglobin G), Serum: 848 mg/dL (ref 603–1613)
IgM (Immunoglobulin M), Srm: 108 mg/dL (ref 20–172)
Total Protein ELP: 5.5 g/dL — ABNORMAL LOW (ref 6.0–8.5)

## 2020-10-19 LAB — C DIFFICILE QUICK SCREEN W PCR REFLEX
C Diff antigen: POSITIVE — AB
C Diff interpretation: DETECTED
C Diff toxin: POSITIVE — AB

## 2020-10-19 LAB — MAGNESIUM: Magnesium: 2.2 mg/dL (ref 1.7–2.4)

## 2020-10-19 LAB — HEMOGLOBIN A1C
Hgb A1c MFr Bld: 6.4 % — ABNORMAL HIGH (ref 4.8–5.6)
Mean Plasma Glucose: 136.98 mg/dL

## 2020-10-19 LAB — MRSA NEXT GEN BY PCR, NASAL: MRSA by PCR Next Gen: NOT DETECTED

## 2020-10-19 LAB — LACTIC ACID, PLASMA: Lactic Acid, Venous: 1.1 mmol/L (ref 0.5–1.9)

## 2020-10-19 LAB — PREPARE RBC (CROSSMATCH)

## 2020-10-19 LAB — BRAIN NATRIURETIC PEPTIDE: B Natriuretic Peptide: 44.9 pg/mL (ref 0.0–100.0)

## 2020-10-19 MED ORDER — CHLORHEXIDINE GLUCONATE CLOTH 2 % EX PADS
6.0000 | MEDICATED_PAD | Freq: Every day | CUTANEOUS | Status: DC
  Administered 2020-10-19 – 2020-10-21 (×4): 6 via TOPICAL

## 2020-10-19 MED ORDER — ZOLPIDEM TARTRATE 5 MG PO TABS
10.0000 mg | ORAL_TABLET | Freq: Once | ORAL | Status: AC
  Administered 2020-10-19: 10 mg via ORAL
  Filled 2020-10-19: qty 2

## 2020-10-19 MED ORDER — FIDAXOMICIN 200 MG PO TABS
200.0000 mg | ORAL_TABLET | Freq: Two times a day (BID) | ORAL | Status: DC
  Administered 2020-10-19 – 2020-10-21 (×5): 200 mg via ORAL
  Filled 2020-10-19 (×5): qty 1

## 2020-10-19 MED ORDER — MORPHINE SULFATE (PF) 2 MG/ML IV SOLN
2.0000 mg | INTRAVENOUS | Status: DC | PRN
Start: 2020-10-19 — End: 2020-10-21
  Administered 2020-10-19 – 2020-10-21 (×9): 2 mg via INTRAVENOUS
  Filled 2020-10-19 (×9): qty 1

## 2020-10-19 MED ORDER — METRONIDAZOLE 500 MG/100ML IV SOLN
500.0000 mg | Freq: Three times a day (TID) | INTRAVENOUS | Status: DC
  Administered 2020-10-19: 500 mg via INTRAVENOUS
  Filled 2020-10-19: qty 100

## 2020-10-19 NOTE — TOC Initial Note (Signed)
Transition of Care [facility]) - Initial/Assessment Note    Patient Details  Name: Austin Chang MRN: [id number] Date of Birth: Nov 03, 1965  Transition of Care [facility]) CM/SW Contact:    Leeroy Cha, RN Phone Number: 10/19/2020, 8:28 AM  Clinical Narrative:                  55 y.o. obese Caucasian male presented to the [facility] Emergency Department via EMS with complaints of recurrent melena. He had 3 bouts of massive bloody bowel movements at home today associated with decreased mentation.  The patient was discharged from [facility] on 9/19 (yesterday) after completing a 7+ day course of antibiotic therapy for treatment fever of unknown origin (which included Rocephin/azithromycin, cefepime/Flagyl/doxycycline/vancomycin) with elevated inflammatory markers and procalcitonin.  Blood cultures were negative.  He had extensive evaluation for infection, which was inconclusive.  He was unable to tolerate MRI of the lumbar spine (CT was negative).  The patient's fever was noted to abate after discontinuation of cefepime.   Of note, the patient has ulcerative colitis and ankylosing spondylitis and reports taking Humira. TOC PLAN OF CARE: Progression: hgb after one unit of prbc 7.3 was 7.6 C-diff toxin and antigen are Positive, iv solu medrol, Flagyl and protonix all iv ongoing. TOC: following for home needs and progression. Expected Discharge Plan: Home/Self Care Barriers to Discharge: Continued Medical Work up   Patient Goals and CMS Choice Patient states their goals for this hospitalization and ongoing recovery are:: to go back home CMS Medicare.gov Compare Post Acute Care list provided to:: Patient    Expected Discharge Plan and Services Expected Discharge Plan: Home/Self Care   Discharge Planning Services: CM Consult   Living arrangements for the past 2 months: Single Family Home                                      Prior Living  Arrangements/Services Living arrangements for the past 2 months: Single Family Home Lives with:: Spouse Patient language and need for interpreter reviewed:: Yes Do you feel safe going back to the place where you live?: Yes            Criminal Activity/Legal Involvement Pertinent to Current Situation/Hospitalization: No - Comment as needed  Activities of Daily Living Home Assistive Devices/Equipment: None ADL Screening (condition at time of admission) Patient's cognitive ability adequate to safely complete daily activities?: Yes Is the patient deaf or have difficulty hearing?: No Does the patient have difficulty seeing, even when wearing glasses/contacts?: No Does the patient have difficulty concentrating, remembering, or making decisions?: No Patient able to express need for assistance with ADLs?: Yes Does the patient have difficulty dressing or bathing?: No Independently performs ADLs?: Yes (appropriate for developmental age) Does the patient have difficulty walking or climbing stairs?: No Weakness of Legs: Both Weakness of Arms/Hands: None  Permission Sought/Granted                  Emotional Assessment Appearance:: Appears stated age     Orientation: : Oriented to Self, Oriented to Place, Oriented to  Time, Oriented to Situation Alcohol / Substance Use: Not Applicable Psych Involvement: No (comment)  Admission diagnosis:  Melena [K92.1] GI bleed [K92.2] Sepsis ([place]) [A41.9] Sepsis, due to unspecified organism, unspecified whether acute organ dysfunction present [facility]) [A41.9] Patient Active Problem List   Diagnosis Date Noted   Melena 10/18/2020  GI bleed 10/18/2020   FUO (fever of unknown origin) 10/18/2020   Ulcerative colitis ([place]) 10/18/2020   Hypokalemia 10/18/2020   Acute blood loss anemia 10/18/2020   Bilateral pneumonia 10/07/2020   Sepsis ([place]) 10/06/2020   Essential hypertension 01/31/2020   Inflammatory bowel disease 01/31/2020   Acute respiratory  failure with hypoxia ([place]) 01/31/2020   Hyponatremia 01/31/2020   Pneumonia due to COVID-19 virus 01/30/2020   PCP:  Josetta Huddle, MD Pharmacy:   [facility], [place] [address] Phone: [phone] Fax: [phone]     Social Determinants of Health (SDOH) Interventions    Readmission Risk Interventions No flowsheet data found.

## 2020-10-19 NOTE — Progress Notes (Addendum)
[place] Progress Note Patient Name: Austin Chang DOB: [birth year] MRN: [id number]   Date of Service  10/19/2020  HPI/Events of Note  Last Hgb 7.6, and then 7.3  eICU Interventions  Hold off on latest blood transfusion     Intervention Category Minor Interventions: Other:  Tilden Dome 10/19/2020, 2:50 AM

## 2020-10-19 NOTE — Progress Notes (Signed)
eLink Physician-Brief Progress Note Patient Name: KENICHI CASSADA DOB: 07-13-1965 MRN: [id number]   Date of Service  10/19/2020  HPI/Events of Note  Patient requests home Ambien for sleep.   eICU Interventions  Plan: Ambien 10 mg PO X 1 now.     Intervention Category Major Interventions: Other:  Lysle Dingwall 10/19/2020, 8:21 PM

## 2020-10-19 NOTE — Progress Notes (Signed)
Per MD, hold off on blood transfusions due to patient's hemoglobin going from 6.3 to 7.6. Will wait for 5 am labs to reevaluate.  Renae Gloss 10/19/2020

## 2020-10-19 NOTE — Progress Notes (Signed)
Date and time results received: 10/19/20 0545   Test: C-diff Critical Value: Positive   Name of Provider Notified: E-Link   Orders Received? Or Actions Taken?:  Enteric precautions initiated

## 2020-10-19 NOTE — Plan of Care (Signed)
  Problem: Education: Goal: Knowledge of General Education information will improve Description: Including pain rating scale, medication(s)/side effects and non-pharmacologic comfort measures Outcome: Progressing   Problem: Health Behavior/Discharge Planning: Goal: Ability to manage health-related needs will improve Outcome: Progressing   Problem: Clinical Measurements: Goal: Ability to maintain clinical measurements within normal limits will improve Outcome: Progressing Goal: Will remain free from infection Outcome: Progressing Goal: Diagnostic test results will improve Outcome: Progressing Goal: Respiratory complications will improve Outcome: Progressing Goal: Cardiovascular complication will be avoided Outcome: Progressing   Problem: Activity: Goal: Risk for activity intolerance will decrease Outcome: Progressing   Problem: Nutrition: Goal: Adequate nutrition will be maintained Outcome: Progressing   Problem: Coping: Goal: Level of anxiety will decrease Outcome: Progressing   Problem: Elimination: Goal: Will not experience complications related to bowel motility Outcome: Progressing Goal: Will not experience complications related to urinary retention Outcome: Progressing   Problem: Pain Managment: Goal: General experience of comfort will improve Outcome: Progressing   Problem: Safety: Goal: Ability to remain free from injury will improve Outcome: Progressing   Problem: Skin Integrity: Goal: Risk for impaired skin integrity will decrease Outcome: Progressing   Problem: Education: Goal: Ability to demonstrate management of disease process will improve Outcome: Progressing Goal: Ability to verbalize understanding of medication therapies will improve Outcome: Progressing Goal: Individualized Educational Video(s) Outcome: Progressing   Problem: Activity: Goal: Capacity to carry out activities will improve Outcome: Progressing   Problem: Cardiac: Goal:  Ability to achieve and maintain adequate cardiopulmonary perfusion will improve Outcome: Progressing   Problem: Education: Goal: Ability to identify signs and symptoms of gastrointestinal bleeding will improve Outcome: Progressing   Problem: Bowel/Gastric: Goal: Will show no signs and symptoms of gastrointestinal bleeding Outcome: Progressing   Problem: Fluid Volume: Goal: Will show no signs and symptoms of excessive bleeding Outcome: Progressing   Problem: Clinical Measurements: Goal: Complications related to the disease process, condition or treatment will be avoided or minimized Outcome: Progressing

## 2020-10-19 NOTE — Progress Notes (Signed)
[place] Progress Note Patient Name: Austin Chang DOB: 26-Nov-1965 MRN: [id number]   Date of Service  10/19/2020  HPI/Events of Note  Pt getting PRBC's chased with lasix  eICU Interventions  Holding maintenance fluids for now     Intervention Category Minor Interventions: Other:  Tilden Dome 10/19/2020, 1:13 AM

## 2020-10-19 NOTE — Consult Note (Signed)
Date of Admission:  10/18/2020          Reason for Consult: C. difficile colitis   Referring Provider:  Baltazar Apo, MD   Assessment:  C. difficile colitis History of ulcerative colitis and ankylosing spondylosis on Humira but no evidence of ulcerative colitis on CT scan GI bleed with melena Recent admission to [facility] with exposure to broad-spectrum antibiotics and extensive work-up for fever of unknown origin including the work-up with that we did as infectious disease consultants  Plan:  Would start Dificid twice daily Agree with stopping steroids Fortunately PPI is not the best medication in the setting of C. difficile but he has melena and work-up for GI bleed is ongoing   Principal Problem:   GI bleed Active Problems:   Essential hypertension   Hyponatremia   Melena   FUO (fever of unknown origin)   Ulcerative colitis ([place])   Hypokalemia   Acute blood loss anemia   C. difficile colitis   Scheduled Meds:  sodium chloride   Intravenous Once   Chlorhexidine Gluconate Cloth  6 each Topical Daily   fidaxomicin  200 mg Oral BID   Continuous Infusions: PRN Meds:.morphine injection, ondansetron (ZOFRAN) IV  HPI: Austin Chang is a 55 y.o. 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  I saw him formally as an infectious disease consult on 11 September we obtained a CT of the abdomen pelvis with contrast that was unrevealing.  2D echocardiogram also failed to show evidence of endocarditis.  We will try to obtain an MRI of the spine but is quite claustrophobic and ultimately had a CT of the spine performed that did not show  any evidence of infection.  's FUO serological work-up was negative including negative CMV antibodies negative acute EBV titers negative HIV test negative hepatitis panel negative histoplasma antigen and urine, Fungitell.  His ANA and double-stranded DNA were negative rheumatoid factor positive  SPEP showed 2 peaks in the beta gamma region that could be due to monoclonal protein.  Was discharged to home on 19 September But then developed melanotic stools with some massive frankly bloody bowel movements at home as well.  He also became increasingly confused and was brought to the ER.  In the ER he had a CT of the chest abdomen pelvis performed was fairly unremarkable other than some air-fluid levels in the colon.  His C. difficile testing was positive with toxin and antigen both positive.  He was initially initiated on corticosteroids IV Protonix as well as IV metronidazole.  We are starting Dificid to treat the C. difficile colitis.  He was stopping his corticosteroids as I do not see a reason that he would have an ulcerative colitis flare right now especially with the imaging findings seen on CT scan.  Reasons for his fevers of unknown origin in the hospital remain unknown.  I spent 84 minutes with the patient including greater than 50% of the time in face to face counseling of the patient guarding C. difficile colitis and how we treated our work-up for his FUO during the hospital personally reviewing CT chest abdomen pelvis CT head along.  Reviewing his C. difficile testing his culture data his serologic  work-up from prior hospitalization along with review of medical records in preparation for the visit and during the visit and in coordination of his care with ICU team.  Review of Systems: Review of Systems  Constitutional:  Positive for fever. Negative for chills, malaise/fatigue and weight loss.  HENT:  Negative for congestion and sore throat.   Eyes:  Negative for blurred vision and  photophobia.  Respiratory:  Negative for cough, shortness of breath and wheezing.   Cardiovascular:  Negative for chest pain, palpitations and leg swelling.  Gastrointestinal:  Positive for blood in stool and melena. Negative for abdominal pain, constipation, diarrhea, heartburn, nausea and vomiting.  Genitourinary:  Negative for dysuria, flank pain and hematuria.  Musculoskeletal:  Negative for back pain, falls, joint pain and myalgias.  Skin:  Negative for itching and rash.  Neurological:  Negative for dizziness, focal weakness, loss of consciousness, weakness and headaches.  Endo/Heme/Allergies:  Does not bruise/bleed easily.  Psychiatric/Behavioral:  Negative for depression and suicidal ideas. The patient does not have insomnia.    Past Medical History:  Diagnosis Date   Ankylosing spondylitis ([place])    Arthritis    Colitis    Colitis    Diverticulosis    Hypertension    Obesity    Rectal bleeding     Social History   Tobacco Use   Smoking status: Never   Smokeless tobacco: Never  Vaping Use   Vaping Use: Never used  Substance Use Topics   Alcohol use: Yes    Alcohol/week: 5.0 standard drinks    Types: 5 Standard drinks or equivalent per week    Comment: weekly   Drug use: No    Family History  Problem Relation Age of Onset   Heart disease Mother    Heart disease Father    Anesthesia problems Neg Hx    Hypotension Neg Hx    Malignant hyperthermia Neg Hx    Pseudochol deficiency Neg Hx    Colon cancer Neg Hx    Rectal cancer Neg Hx    Esophageal cancer Neg Hx    Liver cancer Neg Hx    Allergies  Allergen Reactions   Septra [Bactrim] Other (See Comments)    Very bad migraine   Sulfamethoxazole-Trimethoprim     Other reaction(s): massive headache    OBJECTIVE: Blood pressure 115/71, pulse (!) 103, temperature 98.1 F (36.7 C), temperature source Oral, resp. rate (!) 30, height 5' 8"  (1.727 m), weight 111.2 kg, SpO2 98 %.  Physical Exam Constitutional:       Appearance: He is well-developed.  HENT:     Head: Normocephalic and atraumatic.  Eyes:     Conjunctiva/sclera: Conjunctivae normal.  Cardiovascular:     Rate and Rhythm: Regular rhythm. Tachycardia present.     Heart sounds: No murmur heard.   No friction rub. No gallop.  Pulmonary:     Effort: Pulmonary effort is normal. No respiratory distress.     Breath sounds: No wheezing.  Abdominal:     General: Bowel sounds are normal. There is no distension.     Palpations: Abdomen is soft. There is no mass.     Tenderness: There is no abdominal tenderness.     Hernia: No hernia is present.  Musculoskeletal:        General: No tenderness. Normal range of motion.     Cervical back: Normal range of motion and neck supple.  Skin:    General: Skin is warm and dry.  Coloration: Skin is not pale.     Findings: No erythema or rash.  Neurological:     General: No focal deficit present.     Mental Status: He is alert and oriented to person, place, and time.  Psychiatric:        Mood and Affect: Mood normal.        Speech: Speech is delayed.        Behavior: Behavior normal.        Thought Content: Thought content normal.        Cognition and Memory: He exhibits impaired recent memory.        Judgment: Judgment normal.    Lab Results Lab Results  Component Value Date   WBC 8.2 10/19/2020   HGB 7.4 (L) 10/19/2020   HCT 21.8 (L) 10/19/2020   MCV 95.6 10/19/2020   PLT 442 (H) 10/19/2020    Lab Results  Component Value Date   CREATININE 0.74 10/19/2020   BUN 19 10/19/2020   NA 134 (L) 10/19/2020   K 3.7 10/19/2020   CL 98 10/19/2020   CO2 30 10/19/2020    Lab Results  Component Value Date   ALT 20 10/18/2020   AST 29 10/18/2020   ALKPHOS 172 (H) 10/18/2020   BILITOT 0.9 10/18/2020     Microbiology: Recent Results (from the past 240 hour(s))  Culture, blood (routine x 2)     Status: None   Collection Time: 10/12/20  8:49 AM   Specimen: BLOOD RIGHT ARM  Result  Value Ref Range Status   Specimen Description BLOOD RIGHT ARM  Final   Special Requests   Final    BOTTLES DRAWN AEROBIC AND ANAEROBIC Blood Culture adequate volume   Culture   Final    NO GROWTH 5 DAYS Performed at [facility], [address] [place], [place] [address]    Report Status 10/17/2020 FINAL  Final  Culture, blood (routine x 2)     Status: None   Collection Time: 10/12/20  9:03 AM   Specimen: BLOOD RIGHT HAND  Result Value Ref Range Status   Specimen Description BLOOD RIGHT HAND  Final   Special Requests   Final    BOTTLES DRAWN AEROBIC AND ANAEROBIC Blood Culture adequate volume   Culture   Final    NO GROWTH 5 DAYS Performed at [facility], [place] [address], [place], [place] [address]    Report Status 10/17/2020 FINAL  Final  Blood Culture (routine x 2)     Status: None (Preliminary result)   Collection Time: 10/18/20  3:37 PM   Specimen: BLOOD  Result Value Ref Range Status   Specimen Description   Final    BLOOD LEFT ANTECUBITAL Performed at [facility] [address], [place], [place] [address]    Special Requests   Final    BOTTLES DRAWN AEROBIC AND ANAEROBIC Blood Culture adequate volume Performed at [facility] [address] [place], [place] [address]    Culture   Final    NO GROWTH < 24 HOURS Performed at [facility] [address] [place], [place] [address]    Report Status PENDING  Incomplete  Blood Culture (routine x 2)     Status: None (Preliminary result)   Collection Time: 10/18/20  3:42 PM   Specimen: BLOOD  Result Value Ref Range Status   Specimen Description   Final    BLOOD RIGHT ANTECUBITAL Performed at [facility]  [place] [address], [place], [place] [address]    Special Requests   Final    BOTTLES DRAWN AEROBIC AND ANAEROBIC Blood Culture results may not be optimal due to an excessive volume of blood received in culture bottles Performed at [facility] [address] [place], [place] [address]    Culture   Final    NO GROWTH < 24 HOURS Performed at [facility] [address] [place], [place] [address]    Report Status PENDING  Incomplete  Resp Panel by RT-PCR (Flu A&B, Covid) Nasopharyngeal Swab     Status: None   Collection Time: 10/18/20  6:36 PM   Specimen: Nasopharyngeal Swab; Nasopharyngeal(NP) swabs in vial transport medium  Result Value Ref Range Status   SARS Coronavirus 2 by RT PCR NEGATIVE NEGATIVE Final    Comment: (NOTE) SARS-CoV-2 target nucleic acids are NOT DETECTED.  The SARS-CoV-2 RNA is generally detectable in upper respiratory specimens during the acute phase of infection. The lowest concentration of SARS-CoV-2 viral copies this assay can detect is 138 copies/mL. A negative result does not preclude SARS-Cov-2 infection and should not be used as the sole basis for treatment or other patient management decisions. A negative result may occur with  improper specimen collection/handling, submission of specimen other than nasopharyngeal swab, presence of viral mutation(s) within the areas targeted by this assay, and inadequate number of viral copies(<138 copies/mL). A negative result must be combined with clinical observations, patient history, and epidemiological information. The expected result is Negative.  Fact Sheet for Patients:  EntrepreneurPulse.com.au  Fact Sheet for Healthcare Providers:  IncredibleEmployment.be  This test is no t yet approved or cleared by the [place] FDA and  has been authorized for detection and/or diagnosis of SARS-CoV-2 by FDA under an Emergency Use Authorization (EUA). This EUA will remain  in effect (meaning this test can be used) for the duration of the COVID-19 declaration under Section 564(b)(1) of the Act, 21 U.S.C.section 360bbb-3(b)(1), unless the authorization is terminated  or revoked sooner.        Influenza A by PCR NEGATIVE NEGATIVE Final   Influenza B by PCR NEGATIVE NEGATIVE Final    Comment: (NOTE) The Xpert Xpress SARS-CoV-2/FLU/RSV plus assay is intended as an aid in the diagnosis of influenza from Nasopharyngeal swab specimens and should not be used as a sole basis for treatment. Nasal washings and aspirates are unacceptable for Xpert Xpress SARS-CoV-2/FLU/RSV testing.  Fact Sheet for Patients: EntrepreneurPulse.com.au  Fact Sheet for Healthcare Providers: IncredibleEmployment.be  This test is not yet approved or cleared by the [place] FDA and has been authorized for detection and/or diagnosis of SARS-CoV-2 by FDA under an Emergency Use Authorization (EUA). This EUA will remain in effect (meaning this test can be used) for the duration of the COVID-19 declaration under Section 564(b)(1) of the Act, 21 U.S.C. section 360bbb-3(b)(1), unless the authorization is terminated or revoked.  Performed at [facility], [place] [address] [place], [place] [address]   Resp panel by RT-PCR (RSV, Flu A&B, Covid)     Status: None   Collection Time: 10/18/20  9:30 PM  Result Value Ref Range Status   SARS Coronavirus 2 by RT PCR NEGATIVE NEGATIVE Final    Comment: (NOTE) SARS-CoV-2 target nucleic acids are NOT DETECTED.  The SARS-CoV-2 RNA is generally detectable in upper respiratory specimens during the acute phase of infection. The lowest concentration of SARS-CoV-2 viral copies this assay can detect  is 138 copies/mL. A negative result does not preclude SARS-Cov-2 infection and should not be used as the sole basis for treatment or other patient management decisions. A negative result may occur with  improper specimen collection/handling, submission of specimen other than nasopharyngeal swab, presence of viral mutation(s) within the areas targeted by this assay, and inadequate number of viral copies(<138 copies/mL). A  negative result must be combined with clinical observations, patient history, and epidemiological information. The expected result is Negative.  Fact Sheet for Patients:  EntrepreneurPulse.com.au  Fact Sheet for Healthcare Providers:  IncredibleEmployment.be  This test is no t yet approved or cleared by the [place] FDA and  has been authorized for detection and/or diagnosis of SARS-CoV-2 by FDA under an Emergency Use Authorization (EUA). This EUA will remain  in effect (meaning this test can be used) for the duration of the COVID-19 declaration under Section 564(b)(1) of the Act, 21 U.S.C.section 360bbb-3(b)(1), unless the authorization is terminated  or revoked sooner.       Influenza A by PCR NEGATIVE NEGATIVE Final   Influenza B by PCR NEGATIVE NEGATIVE Final    Comment: (NOTE) The Xpert Xpress SARS-CoV-2/FLU/RSV plus assay is intended as an aid in the diagnosis of influenza from Nasopharyngeal swab specimens and should not be used as a sole basis for treatment. Nasal washings and aspirates are unacceptable for Xpert Xpress SARS-CoV-2/FLU/RSV testing.  Fact Sheet for Patients: EntrepreneurPulse.com.au  Fact Sheet for Healthcare Providers: IncredibleEmployment.be  This test is not yet approved or cleared by the [place] FDA and has been authorized for detection and/or diagnosis of SARS-CoV-2 by FDA under an Emergency Use Authorization (EUA). This EUA will remain in effect (meaning this test can be used) for the duration of the COVID-19 declaration under Section 564(b)(1) of the Act, 21 U.S.C. section 360bbb-3(b)(1), unless the authorization is terminated or revoked.     Resp Syncytial Virus by PCR NEGATIVE NEGATIVE Final    Comment: (NOTE) Fact Sheet for Patients: EntrepreneurPulse.com.au  Fact Sheet for Healthcare  Providers: IncredibleEmployment.be  This test is not yet approved or cleared by the [place] FDA and has been authorized for detection and/or diagnosis of SARS-CoV-2 by FDA under an Emergency Use Authorization (EUA). This EUA will remain in effect (meaning this test can be used) for the duration of the COVID-19 declaration under Section 564(b)(1) of the Act, 21 U.S.C. section 360bbb-3(b)(1), unless the authorization is terminated or revoked.  Performed at [facility], [place] [address], [place], [place] [address]   MRSA Next Gen by PCR, Nasal     Status: None   Collection Time: 10/19/20 12:28 AM   Specimen: Nasal Mucosa; Nasal Swab  Result Value Ref Range Status   MRSA by PCR Next Gen NOT DETECTED NOT DETECTED Final    Comment: (NOTE) The GeneXpert MRSA Assay (FDA approved for NASAL specimens only), is one component of a comprehensive MRSA colonization surveillance program. It is not intended to diagnose MRSA infection nor to guide or monitor treatment for MRSA infections. Test performance is not FDA approved in patients less than 29 years old. Performed at [facility], [place] [address], [place], [place] [address]   Culture, blood (routine x 2)     Status: None (Preliminary result)   Collection Time: 10/19/20 12:38 AM   Specimen: BLOOD  Result Value Ref Range Status   Specimen Description   Final    BLOOD BLOOD LEFT WRIST Performed at [facility]  [address] [place], [place] [address]    Special Requests   Final    BOTTLES DRAWN AEROBIC ONLY Blood Culture adequate volume Performed at [facility] [address] [place], [place] [address]    Culture   Final    NO GROWTH < 12 HOURS Performed at [facility] [address], [place], [place] [address]    Report Status PENDING  Incomplete  Culture, blood (routine x 2)     Status: None (Preliminary result)    Collection Time: 10/19/20 12:38 AM   Specimen: BLOOD  Result Value Ref Range Status   Specimen Description   Final    BLOOD BLOOD RIGHT ARM Performed at [facility] [address], [place], [place] [address]    Special Requests   Final    BOTTLES DRAWN AEROBIC ONLY Blood Culture adequate volume Performed at [facility] [address], [place], [place] [address]    Culture   Final    NO GROWTH < 12 HOURS Performed at [facility] [address] [place], [place] [address]    Report Status PENDING  Incomplete  C Difficile Quick Screen w PCR reflex     Status: Abnormal   Collection Time: 10/19/20  3:46 AM   Specimen: STOOL  Result Value Ref Range Status   C Diff antigen POSITIVE (A) NEGATIVE Final   C Diff toxin POSITIVE (A) NEGATIVE Final   C Diff interpretation Toxin producing C. difficile detected.  Final    Comment: CRITICAL RESULT CALLED TO, READ BACK BY AND VERIFIED WITH: TROI BARKSDALE [email or web address]  ON 9.21.22 BY TCALDWELL MT. Performed at [facility] [address] [place],  [address]     Alcide Evener, [place] [facility] [phone] pager  10/19/2020, 12:00 PM

## 2020-10-19 NOTE — Progress Notes (Signed)
eLink Physician-Brief Progress Note Patient Name: Austin Chang DOB: [birth year] MRN: [id number]   Date of Service  10/19/2020  HPI/Events of Note  Pt with melena re-admitted to hospital  eICU Interventions  MAP stable; getting prbc; no pressors     Intervention Category Evaluation Type: New Patient Evaluation  Tilden Dome 10/19/2020, 1:24 AM

## 2020-10-19 NOTE — Progress Notes (Signed)
NAME:  Austin Chang MRN:  [id number] DOB:  Feb 23, 1965 LOS: 1 ADMISSION DATE:  10/18/2020 DATE OF SERVICE:  10/18/2020  CHIEF COMPLAINT:  recurrent melena   HISTORY & PHYSICAL  History of Present Illness  This 55 y.o. obese Caucasian male presented to the [facility] Emergency Department via EMS with complaints of recurrent melena. He had 3 bouts of massive bloody bowel movements at home today associated with decreased mentation.  The patient was discharged from [facility] on 9/19 (yesterday) after completing a 7+ day course of antibiotic therapy for treatment fever of unknown origin (which included Rocephin/azithromycin, cefepime/Flagyl/doxycycline/vancomycin) with elevated inflammatory markers and procalcitonin.  Blood cultures were negative.  He had extensive evaluation for infection, which was inconclusive.  He was unable to tolerate MRI of the lumbar spine (CT was negative).  The patient's fever was noted to abate after discontinuation of cefepime.  Of note, the patient has ulcerative colitis and ankylosing spondylitis and reports taking Humira.    Past Medical/Surgical/Social/Family History   Past Medical History:  Diagnosis Date   Ankylosing spondylitis ([place])    Arthritis    Colitis    Colitis    Diverticulosis    Hypertension    Obesity    Rectal bleeding     Hospital course    9/20 admitted w/ GIB and + cdiff PCCM asked to admit.  9/21 Cdiff Positive. Initially started IV flagyl. Spoke to GI on call [facility]. No need for endo. Recommended treat C-diff but suggested ID involvement specifically re: possibly treating w/ Dificid instead of vanc which they recommended. Hemodynamically stable. Transitioned to [employer]. Asked triad to assume care. Stopped steroids w/ agreement from GI. Changed PPI gtt to IV bid. Starting cl liq diet.  Interim history/subjective:  Feels ok. He's hungry   Objective   Blood Pressure (Abnormal) 108/58   Pulse 91    Temperature 98.1 F (36.7 C) (Oral)   Respiration (Abnormal) 29   Height 5' 8"  (1.727 m)   Weight 111.2 kg   Oxygen Saturation 99%   Body Mass Index 37.28 kg/m     Filed Weights   10/18/20 1533 10/19/20 0036 10/19/20 0500  Weight: 111.1 kg 111.2 kg 111.2 kg    Intake/Output Summary (Last 24 hours) at 10/19/2020 0824 Last data filed at 10/19/2020 0738 Gross per 24 hour  Intake 440.99 ml  Output 1850 ml  Net -1409.01 ml        Examination: General 55 year old male. Resting in bed. No distress.  HENT NCAT no JVD. MM are dry  Pulm clear. Dec bases. Now on Room air Card RRR no MRG Abd not tender. No OM Gu cl yellow Neuro intact  Resolved Hospital Problem list   Acute metabolic encephalopathy FUO  Assessment & Plan:  Acute GI bleed w/ associated ABLA  Looks like infectious C-diff Colitis superimposed on underlying UC.  -Cdiff antigen and toxin positive.  -had 7 d course of broad spec abx -hgb looks stable  -I spoke to GI. Dr Michail Sermon no indication for scope (won't change rx) no need for steroids (stopped).  Plan Change protonix gtt to IV BID Rx cdiff; ID starting Dificid Serial CBC Stop high dose steroids Hold humera  Hold oral antihypertensives.  Formal GI consult on stand-by (would call back only if clinically declines) Clear liq diet adv as tolerated  Pulmonary edema -clinically resolved w/ 1x diuretic Plan Monitor pulse ox  Keep euvolemic   Fluid and Electrolyte imbalance:  hyponatremia (improved); hypokalemia (resolved)  Plan Monitor  Dc foley   H/o HTN Plan Holding antihypertensives   Best practice:  Diet: cl liq Pain/Anxiety/Delirium protocol (if indicated): N/A VAP protocol (if indicated): N/A DVT prophylaxis: SCDs GI prophylaxis: Protonix Glucose control: N/A. Check HgbA1c. Mobility/Activity: up to commode with assistance   Code Status: Full Code Family Communication:  patient's family (wife updated at bedside) Disposition:  medsurg  Erick Colace ACNP-BC [place] Pager # [phone] OR # [phone] if no answer

## 2020-10-19 NOTE — Progress Notes (Signed)
eLink Physician-Brief Progress Note Patient Name: Austin Chang DOB: 11-11-1965 MRN: [id number]   Date of Service  10/19/2020  HPI/Events of Note  Cdiff (+)   eICU Interventions  Starting IV flagyl (not PO vanco because he's NPO with a GI bleed)     Intervention Category Intermediate Interventions: Diagnostic test evaluation  Tilden Dome 10/19/2020, 6:04 AM

## 2020-10-20 ENCOUNTER — Other Ambulatory Visit (HOSPITAL_COMMUNITY): Payer: Self-pay

## 2020-10-20 DIAGNOSIS — R778 Other specified abnormalities of plasma proteins: Secondary | ICD-10-CM

## 2020-10-20 DIAGNOSIS — A0472 Enterocolitis due to Clostridium difficile, not specified as recurrent: Secondary | ICD-10-CM | POA: Diagnosis not present

## 2020-10-20 DIAGNOSIS — K921 Melena: Secondary | ICD-10-CM | POA: Diagnosis not present

## 2020-10-20 DIAGNOSIS — K922 Gastrointestinal hemorrhage, unspecified: Secondary | ICD-10-CM | POA: Diagnosis not present

## 2020-10-20 DIAGNOSIS — G9341 Metabolic encephalopathy: Secondary | ICD-10-CM | POA: Diagnosis not present

## 2020-10-20 LAB — URINE CULTURE: Culture: NO GROWTH

## 2020-10-20 LAB — CBC
HCT: 20.9 % — ABNORMAL LOW (ref 39.0–52.0)
Hemoglobin: 7 g/dL — ABNORMAL LOW (ref 13.0–17.0)
MCH: 32.4 pg (ref 26.0–34.0)
MCHC: 33.5 g/dL (ref 30.0–36.0)
MCV: 96.8 fL (ref 80.0–100.0)
Platelets: 432 10*3/uL — ABNORMAL HIGH (ref 150–400)
RBC: 2.16 MIL/uL — ABNORMAL LOW (ref 4.22–5.81)
RDW: 13.1 % (ref 11.5–15.5)
WBC: 11.9 10*3/uL — ABNORMAL HIGH (ref 4.0–10.5)
nRBC: 0 % (ref 0.0–0.2)

## 2020-10-20 LAB — HEMOGLOBIN AND HEMATOCRIT, BLOOD
HCT: 22.5 % — ABNORMAL LOW (ref 39.0–52.0)
Hemoglobin: 7.7 g/dL — ABNORMAL LOW (ref 13.0–17.0)

## 2020-10-20 LAB — PREPARE RBC (CROSSMATCH)

## 2020-10-20 LAB — CALCIUM, IONIZED: Calcium, Ionized, Serum: 4.9 mg/dL (ref 4.5–5.6)

## 2020-10-20 MED ORDER — FIDAXOMICIN 200 MG PO TABS
200.0000 mg | ORAL_TABLET | Freq: Two times a day (BID) | ORAL | 0 refills | Status: AC
  Filled 2020-10-20: qty 20, 10d supply, fill #0

## 2020-10-20 MED ORDER — ACETAMINOPHEN 325 MG PO TABS: 650.0000 mg | ORAL_TABLET | Freq: Four times a day (QID) | ORAL | Status: DC | PRN

## 2020-10-20 MED ORDER — ZOLPIDEM TARTRATE 5 MG PO TABS
5.0000 mg | ORAL_TABLET | Freq: Once | ORAL | Status: AC
  Administered 2020-10-20: 5 mg via ORAL
  Filled 2020-10-20: qty 1

## 2020-10-20 MED ORDER — SODIUM CHLORIDE 0.9% IV SOLUTION: Freq: Once | INTRAVENOUS | Status: AC

## 2020-10-20 NOTE — Progress Notes (Signed)
PROGRESS NOTE   Austin Chang  AJG:[id number]    DOB: 1965/10/15    DOA: 10/18/2020  PCP: Josetta Huddle, MD   I have briefly reviewed patients previous medical records in [facility].  Chief Complaint  Patient presents with   GI Problem    Brief Narrative:  PCCM to [facility] transfer 10/20/2020: 55 year old male with medical history significant for ankylosing spondylitis, ulcerative colitis on Humira, hypertension, diverticulosis, recent hospitalization 10/06/2020 - 10/17/2020 for fever, ID consulted, no clear cause identified, treated with broad-spectrum antibiotics x7 days, now admitted with melena and bloody BMs, some lethargy and decreased mentation.  Admitted for C. difficile colitis with acute GI bleeding, acute blood loss anemia, superimposed on known history of ulcerative colitis.  Admitted to ICU, stabilized and transferred to [facility].  Discussed with [facility].  ID consulted.   Assessment & Plan:  Principal Problem:   GI bleed Active Problems:   Essential hypertension   Hyponatremia   Melena   FUO (fever of unknown origin)   Ulcerative colitis ([place])   Hypokalemia   Acute blood loss anemia   C. difficile colitis   C. difficile colitis: Recent hospitalization and exposure to broad-spectrum antibiotics for FUO.  ID input appreciated.  Started Dificid twice daily.  Now off steroids and PPI.  Improving.  Acute GI bleed: Likely secondary to C. difficile colitis complicating underlying ulcerative colitis versus diverticulosis versus others.  Low index of suspicion for upper GI bleed.  I discussed with Dr. Michail Sermon, [facility] on 9/22, supportive care, no endoscopies planned, gradually advance diet as tolerated.  Due to C. difficile colitis and low index of suspicion for upper GI bleed, PPI discontinued.  Acute blood loss anemia: Secondary to GI bleed and possible recent acute illness.  Hemoglobin on 10/06/2020: 14.1.  Admitting hemoglobin this time on 9/20: 7.7, dropped to 6.3, s/p 1 unit PRBC  with appropriate response.  Hemoglobin has gradually drifted down since then to 7 today.  Transfuse additional unit of PRBC and follow CBC with goal hemoglobin >7.  Patient agreeable.  Anemia panel: Ferritin 958/likely acute phase reactant, folate 9.7, B12: 990.  Ulcerative colitis on Humira at baseline: No evidence of acute ulcerative colitis on CT scan.  Holding Humira.  As per discussion with Dr. Michail Sermon 9/22, outpatient follow-up with Dr. Paulita Fujita to determine timing of resumption of Humira.  Essential hypertension: Controlled off meds.  Monitor.  Resolved problems: Hyponatremia Hypokalemia Lactic acidosis Acute pulmonary edema/anasarca (no CHF diagnosis), likely secondary to IVF resuscitation, s/p IV Lasix x1  Body mass index is 36.07 kg/m.   DVT prophylaxis: SCDs Start: 10/18/20 2306     Code Status: Full Code Family Communication: None at bedside Disposition:  Status is: Inpatient  Remains inpatient appropriate because:Inpatient level of care appropriate due to severity of illness  Dispo: The patient is from: Home              Anticipated d/c is to: Home              Patient currently is not medically stable to d/c.   Difficult to place patient No        Consultants:   PCCM Infectious disease  Procedures:   None  Antimicrobials:    Anti-infectives (From admission, onward)    Start     Dose/Rate Route Frequency Ordered Stop   10/20/20 0000  fidaxomicin (DIFICID) 200 MG TABS tablet        200 mg Oral 2 times daily 10/20/20  1122 10/30/20 2359   10/19/20 1600  vancomycin (VANCOREADY) IVPB 1750 mg/350 mL  Status:  Discontinued        1,750 mg 175 mL/hr over 120 Minutes Intravenous Every 24 hours 10/18/20 2212 10/18/20 2318   10/19/20 1000  fidaxomicin (DIFICID) tablet 200 mg        200 mg Oral 2 times daily 10/19/20 0850 10/29/20 0959   10/19/20 0800  metroNIDAZOLE (FLAGYL) IVPB 500 mg  Status:  Discontinued        500 mg 100 mL/hr over 60 Minutes Intravenous  Every 12 hours 10/18/20 2127 10/18/20 2318   10/19/20 0700  metroNIDAZOLE (FLAGYL) IVPB 500 mg  Status:  Discontinued        500 mg 100 mL/hr over 60 Minutes Intravenous Every 8 hours 10/19/20 0608 10/19/20 0850   10/19/20 0000  ceFEPIme (MAXIPIME) 2 g in sodium chloride 0.9 % 100 mL IVPB  Status:  Discontinued        2 g 200 mL/hr over 30 Minutes Intravenous Every 8 hours 10/18/20 2212 10/18/20 2318   10/18/20 1600  vancomycin (VANCOREADY) IVPB 2000 mg/400 mL        2,000 mg 200 mL/hr over 120 Minutes Intravenous  Once 10/18/20 1549 10/18/20 1856   10/18/20 1545  ceFEPIme (MAXIPIME) 2 g in sodium chloride 0.9 % 100 mL IVPB        2 g 200 mL/hr over 30 Minutes Intravenous  Once 10/18/20 1543 10/18/20 1647   10/18/20 1545  metroNIDAZOLE (FLAGYL) IVPB 500 mg        500 mg 100 mL/hr over 60 Minutes Intravenous  Once 10/18/20 1543 10/18/20 2045   10/18/20 1545  vancomycin (VANCOCIN) IVPB 1000 mg/200 mL premix  Status:  Discontinued        1,000 mg 200 mL/hr over 60 Minutes Intravenous  Once 10/18/20 1543 10/18/20 1549         Subjective:  2 BMs over the last 24 hours, last 1 between 8-10 PM last night.  Volume and consistency improving.  No abdominal pain.  Wants to advance diet.  Asking when he can go home..  Per nursing, no acute issues reported  Objective:   Vitals:   10/20/20 0957 10/20/20 1000 10/20/20 1100 10/20/20 1116  BP: 115/67 113/80 122/67   Pulse: 86 83 86   Resp: (!) 26 (!) 22 (!) 24   Temp: 97.9 F (36.6 C)   (!) 97.4 F (36.3 C)  TempSrc: Oral   Oral  SpO2: 99% 97% 97%   Weight:      Height:        General exam: Young male, moderately built and obese lying comfortably propped up in bed without distress.  Oral mucosa moist. Respiratory system: Clear to auscultation. Respiratory effort normal. Cardiovascular system: S1 & S2 heard, RRR. No JVD, murmurs, rubs, gallops or clicks. No pedal edema.  Not on telemetry. Gastrointestinal system: Abdomen is nondistended,  soft and nontender. No organomegaly or masses felt. Normal bowel sounds heard. Central nervous system: Alert and oriented. No focal neurological deficits. Extremities: Symmetric 5 x 5 power. Skin: No rashes, lesions or ulcers Psychiatry: Judgement and insight appear normal. Mood & affect appropriate.     Data Reviewed:   I have personally reviewed following labs and imaging studies   CBC: Recent Labs  Lab 10/17/20 0317 10/18/20 1537 10/18/20 2058 10/18/20 2229 10/19/20 0947 10/19/20 1642 10/20/20 0248  WBC 13.8* 10.9* 9.2   < > 8.2 10.1 11.9*  NEUTROABS  11.6* 8.3* 6.5  --   --   --   --   HGB 9.9* 7.7* 6.3*   < > 7.4* 7.1* 7.0*  HCT 28.1* 22.3* 18.7*   < > 21.8* 20.6* 20.9*  MCV 93.0 95.7 96.4   < > 95.6 95.4 96.8  PLT 447* 493* 406*   < > 442* 507* 432*   < > = values in this interval not displayed.    Basic Metabolic Panel: Recent Labs  Lab 10/15/20 0828 10/16/20 0347 10/17/20 1634 10/18/20 1537 10/18/20 2058 10/19/20 0038  NA  --    < > 130* 130*  --  134*  K  --    < > 3.6 3.5  --  3.7  CL  --    < > 90* 92*  --  98  CO2  --    < > 28 27  --  30  GLUCOSE  --    < > 132* 188*  --  149*  BUN  --    < > 20 30*  --  19  CREATININE  --    < > 1.03 1.05  --  0.74  CALCIUM  --    < > 8.3* 7.5*  --  8.0*  MG 2.0  --   --   --  1.9 2.2  PHOS  --   --   --   --  3.5  --    < > = values in this interval not displayed.    Liver Function Tests: Recent Labs  Lab 10/18/20 1537  AST 29  ALT 20  ALKPHOS 172*  BILITOT 0.9  PROT 5.5*  ALBUMIN 2.2*    CBG: No results for input(s): GLUCAP in the last 168 hours.  Microbiology Studies:   Recent Results (from the past 240 hour(s))  Culture, blood (routine x 2)     Status: None   Collection Time: 10/12/20  8:49 AM   Specimen: BLOOD RIGHT ARM  Result Value Ref Range Status   Specimen Description BLOOD RIGHT ARM  Final   Special Requests   Final    BOTTLES DRAWN AEROBIC AND ANAEROBIC Blood Culture adequate volume    Culture   Final    NO GROWTH 5 DAYS Performed at [facility], [address] [place], [place] [address]    Report Status 10/17/2020 FINAL  Final  Culture, blood (routine x 2)     Status: None   Collection Time: 10/12/20  9:03 AM   Specimen: BLOOD RIGHT HAND  Result Value Ref Range Status   Specimen Description BLOOD RIGHT HAND  Final   Special Requests   Final    BOTTLES DRAWN AEROBIC AND ANAEROBIC Blood Culture adequate volume   Culture   Final    NO GROWTH 5 DAYS Performed at [facility], [place] [address], [place], [place] [address]    Report Status 10/17/2020 FINAL  Final  Blood Culture (routine x 2)     Status: None (Preliminary result)   Collection Time: 10/18/20  3:37 PM   Specimen: BLOOD  Result Value Ref Range Status   Specimen Description   Final    BLOOD LEFT ANTECUBITAL Performed at [facility] [address] [place], [place] [address]    Special Requests   Final    BOTTLES DRAWN AEROBIC AND ANAEROBIC Blood Culture adequate volume Performed at [facility] [address] [place], [place] [address]  Culture   Final    NO GROWTH 2 DAYS Performed at [facility], [place] [address], [place], [place] [address]    Report Status PENDING  Incomplete  Blood Culture (routine x 2)     Status: None (Preliminary result)   Collection Time: 10/18/20  3:42 PM   Specimen: BLOOD  Result Value Ref Range Status   Specimen Description   Final    BLOOD RIGHT ANTECUBITAL Performed at [facility] [address] [place], [place] [address]    Special Requests   Final    BOTTLES DRAWN AEROBIC AND ANAEROBIC Blood Culture results may not be optimal due to an excessive volume of blood received in culture bottles Performed at [facility] [address], [place], [place] [address]    Culture   Final    NO GROWTH 2 DAYS Performed at [facility] [address], [place],  [place] [address]    Report Status PENDING  Incomplete  Urine Culture     Status: None   Collection Time: 10/18/20  6:25 PM   Specimen: In/Out Cath Urine  Result Value Ref Range Status   Specimen Description   Final    IN/OUT CATH URINE Performed at [facility] [address], [place], [place] [address]    Special Requests   Final    NONE Performed at [facility], [place] [address], [place], [place] [address]    Culture   Final    NO GROWTH Performed at [facility], [place] [address], [place], [place] [address]    Report Status 10/20/2020 FINAL  Final  Resp Panel by RT-PCR (Flu A&B, Covid) Nasopharyngeal Swab     Status: None   Collection Time: 10/18/20  6:36 PM   Specimen: Nasopharyngeal Swab; Nasopharyngeal(NP) swabs in vial transport medium  Result Value Ref Range Status   SARS Coronavirus 2 by RT PCR NEGATIVE NEGATIVE Final    Comment: (NOTE) SARS-CoV-2 target nucleic acids are NOT DETECTED.  The SARS-CoV-2 RNA is generally detectable in upper respiratory specimens during the acute phase of infection. The lowest concentration of SARS-CoV-2 viral copies this assay can detect is 138 copies/mL. A negative result does not preclude SARS-Cov-2 infection and should not be used as the sole basis for treatment or other patient management decisions. A negative result may occur with  improper specimen collection/handling, submission of specimen other than nasopharyngeal swab, presence of viral mutation(s) within the areas targeted by this assay, and inadequate number of viral copies(<138 copies/mL). A negative result must be combined with clinical observations, patient history, and epidemiological information. The expected result is Negative.  Fact Sheet for Patients:  EntrepreneurPulse.com.au  Fact Sheet for Healthcare Providers:  IncredibleEmployment.be  This test is no t yet approved or cleared by the  [place] FDA and  has been authorized for detection and/or diagnosis of SARS-CoV-2 by FDA under an Emergency Use Authorization (EUA). This EUA will remain  in effect (meaning this test can be used) for the duration of the COVID-19 declaration under Section 564(b)(1) of the Act, 21 U.S.C.section 360bbb-3(b)(1), unless the authorization is terminated  or revoked sooner.       Influenza A by PCR NEGATIVE NEGATIVE Final   Influenza B by PCR NEGATIVE NEGATIVE Final    Comment: (NOTE) The Xpert Xpress SARS-CoV-2/FLU/RSV plus assay is intended as an aid in the diagnosis of influenza from Nasopharyngeal swab specimens and should not be used  as a sole basis for treatment. Nasal washings and aspirates are unacceptable for Xpert Xpress SARS-CoV-2/FLU/RSV testing.  Fact Sheet for Patients: EntrepreneurPulse.com.au  Fact Sheet for Healthcare Providers: IncredibleEmployment.be  This test is not yet approved or cleared by the [place] FDA and has been authorized for detection and/or diagnosis of SARS-CoV-2 by FDA under an Emergency Use Authorization (EUA). This EUA will remain in effect (meaning this test can be used) for the duration of the COVID-19 declaration under Section 564(b)(1) of the Act, 21 U.S.C. section 360bbb-3(b)(1), unless the authorization is terminated or revoked.  Performed at [facility], [place] [address] [place] [address], [place], [place] [address]   Resp panel by RT-PCR (RSV, Flu A&B, Covid)     Status: None   Collection Time: 10/18/20  9:30 PM  Result Value Ref Range Status   SARS Coronavirus 2 by RT PCR NEGATIVE NEGATIVE Final    Comment: (NOTE) SARS-CoV-2 target nucleic acids are NOT DETECTED.  The SARS-CoV-2 RNA is generally detectable in upper respiratory specimens during the acute phase of infection. The lowest concentration of SARS-CoV-2 viral copies this assay can detect is 138 copies/mL. A negative result does  not preclude SARS-Cov-2 infection and should not be used as the sole basis for treatment or other patient management decisions. A negative result may occur with  improper specimen collection/handling, submission of specimen other than nasopharyngeal swab, presence of viral mutation(s) within the areas targeted by this assay, and inadequate number of viral copies(<138 copies/mL). A negative result must be combined with clinical observations, patient history, and epidemiological information. The expected result is Negative.  Fact Sheet for Patients:  EntrepreneurPulse.com.au  Fact Sheet for Healthcare Providers:  IncredibleEmployment.be  This test is no t yet approved or cleared by the [place] FDA and  has been authorized for detection and/or diagnosis of SARS-CoV-2 by FDA under an Emergency Use Authorization (EUA). This EUA will remain  in effect (meaning this test can be used) for the duration of the COVID-19 declaration under Section 564(b)(1) of the Act, 21 U.S.C.section 360bbb-3(b)(1), unless the authorization is terminated  or revoked sooner.       Influenza A by PCR NEGATIVE NEGATIVE Final   Influenza B by PCR NEGATIVE NEGATIVE Final    Comment: (NOTE) The Xpert Xpress SARS-CoV-2/FLU/RSV plus assay is intended as an aid in the diagnosis of influenza from Nasopharyngeal swab specimens and should not be used as a sole basis for treatment. Nasal washings and aspirates are unacceptable for Xpert Xpress SARS-CoV-2/FLU/RSV testing.  Fact Sheet for Patients: EntrepreneurPulse.com.au  Fact Sheet for Healthcare Providers: IncredibleEmployment.be  This test is not yet approved or cleared by the [place] FDA and has been authorized for detection and/or diagnosis of SARS-CoV-2 by FDA under an Emergency Use Authorization (EUA). This EUA will remain in effect (meaning this test can be used) for the  duration of the COVID-19 declaration under Section 564(b)(1) of the Act, 21 U.S.C. section 360bbb-3(b)(1), unless the authorization is terminated or revoked.     Resp Syncytial Virus by PCR NEGATIVE NEGATIVE Final    Comment: (NOTE) Fact Sheet for Patients: EntrepreneurPulse.com.au  Fact Sheet for Healthcare Providers: IncredibleEmployment.be  This test is not yet approved or cleared by the [place] FDA and has been authorized for detection and/or diagnosis of SARS-CoV-2 by FDA under an Emergency Use Authorization (EUA). This EUA will remain in effect (meaning this test can be used) for the duration of the COVID-19 declaration under Section 564(b)(1) of the Act, 21  U.S.C. section 360bbb-3(b)(1), unless the authorization is terminated or revoked.  Performed at [facility], [place], [place] [address], [place], [place] [address]   MRSA Next Gen by PCR, Nasal     Status: None   Collection Time: 10/19/20 12:28 AM   Specimen: Nasal Mucosa; Nasal Swab  Result Value Ref Range Status   MRSA by PCR Next Gen NOT DETECTED NOT DETECTED Final    Comment: (NOTE) The GeneXpert MRSA Assay (FDA approved for NASAL specimens only), is one component of a comprehensive MRSA colonization surveillance program. It is not intended to diagnose MRSA infection nor to guide or monitor treatment for MRSA infections. Test performance is not FDA approved in patients less than 33 years old. Performed at [facility], [place] [address] [place], [place] [address]   Culture, blood (routine x 2)     Status: None (Preliminary result)   Collection Time: 10/19/20 12:38 AM   Specimen: BLOOD  Result Value Ref Range Status   Specimen Description   Final    BLOOD BLOOD LEFT WRIST Performed at [facility] [address], [place], [place] [address]    Special Requests   Final    BOTTLES DRAWN AEROBIC ONLY Blood Culture adequate  volume Performed at [facility] [address], [place], [place] [address]    Culture   Final    NO GROWTH 1 DAY Performed at [facility], [place] [address] [place], [place] [address]    Report Status PENDING  Incomplete  Culture, blood (routine x 2)     Status: None (Preliminary result)   Collection Time: 10/19/20 12:38 AM   Specimen: BLOOD  Result Value Ref Range Status   Specimen Description   Final    BLOOD BLOOD RIGHT ARM Performed at [facility] [address], [place], [place] [address]    Special Requests   Final    BOTTLES DRAWN AEROBIC ONLY Blood Culture adequate volume Performed at [facility] [address] [place], [place] [address]    Culture   Final    NO GROWTH 1 DAY Performed at [facility], [place] [address] [place], [place] [address]    Report Status PENDING  Incomplete  C Difficile Quick Screen w PCR reflex     Status: Abnormal   Collection Time: 10/19/20  3:46 AM   Specimen: STOOL  Result Value Ref Range Status   C Diff antigen POSITIVE (A) NEGATIVE Final   C Diff toxin POSITIVE (A) NEGATIVE Final   C Diff interpretation Toxin producing C. difficile detected.  Final    Comment: CRITICAL RESULT CALLED TO, READ BACK BY AND VERIFIED WITH: TROI BARKSDALE RN.@[email or web address]  ON 9.21.22 BY TCALDWELL MT. Performed at [facility] [address], [place], [place] [address]      Radiology Studies:  CT HEAD WO CONTRAST (5MM)  Result Date: 10/18/2020 CLINICAL DATA:  Mental status change EXAM: CT HEAD WITHOUT CONTRAST TECHNIQUE: Contiguous axial images were obtained from the base of the skull through the vertex without intravenous contrast. COMPARISON:  None. FINDINGS: Brain: No evidence of acute infarction, hemorrhage, cerebral edema, mass, mass effect, or midline shift. Ventricles and sulci are within normal limits for age. No extra-axial fluid collection. Periventricular white  matter changes, likely the sequela of chronic small vessel ischemic disease. Vascular: No hyperdense vessel or unexpected calcification. Skull: Normal. Negative for fracture or focal lesion. Sinuses/Orbits: Air-fluid levels in the maxillary  sinuses with bubbly fluid. Trace mucosal thickening in the right anterior ethmoid air cells. Thickening of the walls of the maxillary sinus, as can be seen with a history of chronic sinusitis. Normal orbits. Other: Fluid in the left greater than right mastoid air cells. IMPRESSION: 1. No acute intracranial process. 2. Air-fluid levels with bubbly fluid in the bilateral maxillary sinuses, as can be seen in setting of acute sinusitis. Correlate with symptoms. Electronically Signed   By: Merilyn Baba M.D.   On: 10/18/2020 19:53   CT CHEST ABDOMEN PELVIS W CONTRAST  Result Date: 10/18/2020 CLINICAL DATA:  Sepsis Unclear source of infection, concern for epidural abscess but patient refusing MRI EXAM: CT CHEST, ABDOMEN, AND PELVIS WITH CONTRAST TECHNIQUE: Multidetector CT imaging of the chest, abdomen and pelvis was performed following the standard protocol during bolus administration of intravenous contrast. CONTRAST:  122m OMNIPAQUE IOHEXOL 350 MG/ML SOLN COMPARISON:  Abdominopelvic CT 10/10/2020, chest CTA 10/06/2020 FINDINGS: CT CHEST FINDINGS Cardiovascular: Normal caliber thoracic aorta. Aortic atherosclerosis. No acute aortic findings. Heart is normal in size. There are coronary artery calcifications. No pericardial effusion. No central pulmonary emboli on this non CTA exam. No pericardial effusion. Mediastinum/Nodes: No enlarged mediastinal or hilar lymph nodes. No axillary adenopathy. No esophageal wall thickening. No thyroid nodule. Lungs/Pleura: Breathing motion artifact. Scattered areas of subsegmental atelectasis. No confluent airspace disease or pneumonia. Trace left pleural effusion/thickening. Findings of pulmonary edema. Pulmonary mass. Musculoskeletal: Thoracic  spine better assessed on concurrent thoracic spine CT, reported separately. No findings of extra-spinal musculoskeletal infection. No chest wall soft tissue collection or abnormality. CT ABDOMEN PELVIS FINDINGS Hepatobiliary: Again seen hepatic steatosis without focal hepatic abnormality. No intrahepatic collection. No biliary dilatation. Multiple gallstones in the gallbladder. Equivocal gallbladder wall thickening, limited due to motion. No visualized choledocholithiasis. Pancreas: No ductal dilatation or inflammation. Spleen: Upper normal in size without focal abnormality. Adrenals/Urinary Tract: No adrenal nodule. Cortical scarring in the mid right kidney. No hydronephrosis. Homogeneous enhancement with symmetric excretion on delayed phase imaging. No evidence of focal renal lesion or stone. Bladder previously deviating into the left pelvis is not demonstrated on the current exam. There is no bladder wall thickening. Stomach/Bowel: Scattered air and fluid in the colon without wall thickening or colonic inflammation. The appendix is not seen. Majority of small bowel is decompressed. No small bowel inflammation. Few small bowel loops opposed the left anterolateral abdominal wall suggesting adhesions, but no obstruction. Vascular/Lymphatic: Patent portal vein. Aortic atherosclerosis. Scattered central mesenteric and retroperitoneal lymph nodes, not enlarged by size criteria. Small calcified lymph nodes in the porta hepatis. No acute vascular findings Reproductive: Prostate is unremarkable. Other: No free air or ascites. Postsurgical change of the anterior abdominal wall. Mild body wall edema which is more prominent in the flanks, and increased on the left compared to right. Musculoskeletal: Lumbar spine assessed on concurrent lumbar spine CT, reported separately. Avascular necrosis of both femoral heads. Partial fusion of the sacroiliac joints consistent with history of ankylosing spondylitis. Ossification in the  left gluteal medius small cells likely related to remote injury. No evidence of intramuscular collection. No hip joint effusion or findings of musculoskeletal infection. IMPRESSION: 1. Cholelithiasis. Equivocal gallbladder wall thickening, detailed evaluation limited due to motion. 2. Occasional pulmonary atelectasis. No pneumonia or evidence of intrathoracic infection. 3. Mild body wall edema which is more prominent in the flanks, and increased on the left compared to right. This may be due to third-spacing. 4. Hepatic steatosis. 5. Avascular necrosis of both femoral heads without  collapse. 6. Please reference thoracic and lumbar spine CT reported separately for spinal assessment. Aortic Atherosclerosis (ICD10-I70.0). Electronically Signed   By: Keith Rake M.D.   On: 10/18/2020 20:00   CT T-SPINE NO CHARGE  Result Date: 10/18/2020 CLINICAL DATA:  Initial evaluation for sepsis, concern for epidural abscess. EXAM: CT THORACIC AND LUMBAR SPINE WITHOUT CONTRAST TECHNIQUE: Multidetector CT imaging of the thoracic and lumbar spine was performed without contrast. Multiplanar CT image reconstructions were also generated. COMPARISON:  None. FINDINGS: CT THORACIC SPINE FINDINGS Alignment: Physiologic with preservation of the normal thoracic kyphosis. No listhesis. Vertebrae: Diffusely flowing syndesmophytes seen throughout the thoracic spine with fusion of the posterior elements, consistent with ankylosing spondylitis. Vertebral body height maintained with no visible acute or chronic fracture. No discrete or worrisome osseous lesions. Postsurgical changes partially visualized at the cervicothoracic junction. No visible hardware complication. Paraspinal and other soft tissues: Paraspinous soft tissues demonstrate no acute finding. Chronic fatty atrophy noted throughout the posterior paraspinous musculature. Mild atelectatic changes noted dependently within the visualized lungs. Scattered coronary artery  calcifications with aortic atherosclerosis noted. Disc levels: No significant disc pathology seen within the thoracic spine. No significant stenosis or neural impingement. No findings to suggest osteomyelitis discitis or septic arthritis. No visible epidural abscess or other collection. CT LUMBAR SPINE FINDINGS Segmentation: Standard. Lowest well-formed disc space labeled the L5-S1 level. Alignment: Mild exaggeration of the normal lumbar lordosis. No listhesis. Vertebrae: Vertebral body height maintained without acute or chronic fracture. Diffusely flowing syndesmophytes seen throughout the lumbar spine with ankylosis/fusion of the SI joints bilaterally, consistent with ankylosing spondylitis. No discrete or worrisome osseous lesions. No findings to suggest osteomyelitis discitis or septic arthritis. No discrete or worrisome osseous lesions. Paraspinal and other soft tissues: Paraspinous soft tissues demonstrate no acute finding. Chronic fatty atrophy noted within the posterior paraspinous musculature. Aortic atherosclerosis noted. Disc levels: L1-2: Negative interspace. Mild to moderate facet hypertrophy. No stenosis. L2-3: Negative interspace. Mild to moderate facet hypertrophy, greater on the right. No stenosis. L3-4: Negative interspace. Mild to moderate facet hypertrophy. No significant stenosis. L4-5: Negative interspace. Moderate to advanced bilateral facet arthrosis. No significant spinal stenosis. Mild to moderate bilateral L4 foraminal narrowing. L5-S1: Disc desiccation with mild disc bulge. Severe bilateral facet arthrosis. No significant spinal stenosis. Moderate bilateral L5 foraminal narrowing. IMPRESSION: 1. No CT evidence for acute infection within the thoracolumbar spine. No visible epidural abscess or other collection. 2. Findings consistent with ankylosing spondylitis. 3. Moderate to advanced facet arthropathy at L4-5 and L5-S1 with associated moderate bilateral L4 and L5 foraminal stenosis. 4.  Aortic Atherosclerosis (ICD10-I70.0). Electronically Signed   By: Jeannine Boga M.D.   On: 10/18/2020 20:04   CT L-SPINE NO CHARGE  Result Date: 10/18/2020 CLINICAL DATA:  Initial evaluation for sepsis, concern for epidural abscess. EXAM: CT THORACIC AND LUMBAR SPINE WITHOUT CONTRAST TECHNIQUE: Multidetector CT imaging of the thoracic and lumbar spine was performed without contrast. Multiplanar CT image reconstructions were also generated. COMPARISON:  None. FINDINGS: CT THORACIC SPINE FINDINGS Alignment: Physiologic with preservation of the normal thoracic kyphosis. No listhesis. Vertebrae: Diffusely flowing syndesmophytes seen throughout the thoracic spine with fusion of the posterior elements, consistent with ankylosing spondylitis. Vertebral body height maintained with no visible acute or chronic fracture. No discrete or worrisome osseous lesions. Postsurgical changes partially visualized at the cervicothoracic junction. No visible hardware complication. Paraspinal and other soft tissues: Paraspinous soft tissues demonstrate no acute finding. Chronic fatty atrophy noted throughout the posterior paraspinous musculature. Mild atelectatic changes noted  dependently within the visualized lungs. Scattered coronary artery calcifications with aortic atherosclerosis noted. Disc levels: No significant disc pathology seen within the thoracic spine. No significant stenosis or neural impingement. No findings to suggest osteomyelitis discitis or septic arthritis. No visible epidural abscess or other collection. CT LUMBAR SPINE FINDINGS Segmentation: Standard. Lowest well-formed disc space labeled the L5-S1 level. Alignment: Mild exaggeration of the normal lumbar lordosis. No listhesis. Vertebrae: Vertebral body height maintained without acute or chronic fracture. Diffusely flowing syndesmophytes seen throughout the lumbar spine with ankylosis/fusion of the SI joints bilaterally, consistent with ankylosing  spondylitis. No discrete or worrisome osseous lesions. No findings to suggest osteomyelitis discitis or septic arthritis. No discrete or worrisome osseous lesions. Paraspinal and other soft tissues: Paraspinous soft tissues demonstrate no acute finding. Chronic fatty atrophy noted within the posterior paraspinous musculature. Aortic atherosclerosis noted. Disc levels: L1-2: Negative interspace. Mild to moderate facet hypertrophy. No stenosis. L2-3: Negative interspace. Mild to moderate facet hypertrophy, greater on the right. No stenosis. L3-4: Negative interspace. Mild to moderate facet hypertrophy. No significant stenosis. L4-5: Negative interspace. Moderate to advanced bilateral facet arthrosis. No significant spinal stenosis. Mild to moderate bilateral L4 foraminal narrowing. L5-S1: Disc desiccation with mild disc bulge. Severe bilateral facet arthrosis. No significant spinal stenosis. Moderate bilateral L5 foraminal narrowing. IMPRESSION: 1. No CT evidence for acute infection within the thoracolumbar spine. No visible epidural abscess or other collection. 2. Findings consistent with ankylosing spondylitis. 3. Moderate to advanced facet arthropathy at L4-5 and L5-S1 with associated moderate bilateral L4 and L5 foraminal stenosis. 4. Aortic Atherosclerosis (ICD10-I70.0). Electronically Signed   By: Jeannine Boga M.D.   On: 10/18/2020 20:04   DG Chest Port 1 View  Result Date: 10/18/2020 CLINICAL DATA:  Questionable sepsis. EXAM: PORTABLE CHEST 1 VIEW COMPARISON:  Chest x-ray 10/12/2020. FINDINGS: The cardiac silhouette appears enlarged, unchanged. There is central pulmonary vascular congestion. There is no lung consolidation. There is likely a small left pleural effusion. There is no pneumothorax or acute fracture. IMPRESSION: 1. Cardiomegaly with central pulmonary vascular congestion. 2. Small left pleural effusion. Electronically Signed   By: Ronney Asters M.D.   On: 10/18/2020 16:31     Scheduled  Meds:    Chlorhexidine Gluconate Cloth  6 each Topical Daily   fidaxomicin  200 mg Oral BID    Continuous Infusions:     LOS: 2 days     Vernell Leep, MD, [place], [facility]    To contact the attending provider between 7A-7P or the covering provider during after hours 7P-7A, please log into the web site [email or web address] and access using universal [place] password for that web site. If you do not have the password, please call the hospital operator.  10/20/2020, 11:36 AM

## 2020-10-20 NOTE — Progress Notes (Signed)
Nutrition Brief Note  Patient identified on the Malnutrition Screening Tool (MST) Report with score of 2.0  Wt Readings from Last 15 Encounters:  10/20/20 107.6 kg  10/17/20 111.2 kg  02/01/20 116.1 kg  09/24/16 112.6 kg  07/11/16 108.9 kg  01/03/11 99.8 kg  12/04/10 97.5 kg  11/08/10 100 kg    Body mass index is 36.07 kg/m. Patient meets criteria for obesity based on current BMI. Weight today is 237 lb and weight on 02/01/20 was 255 lb. This indicates 18 lb weight loss (7% body weight) in the past 9 months; not significant for time frame.  Diet advanced to CLD yesterday at 0900 and he consumed 100% of lunch yesterday and 100% of breakfast this AM. Diet advanced to FLD today at 1137 and he consumed 100% of lunch.  Labs and medications reviewed.   Patient admitted for acute GIB and cdiff colitis. MD notes indicate improvement in both.   No nutrition interventions warranted at this time. If nutrition issues arise, please consult RD.      Jarome Matin, MS, RD, LDN, CNSC Inpatient Clinical Dietitian RD pager # available in [place]  After hours/weekend pager # available in [facility]

## 2020-10-20 NOTE — Progress Notes (Signed)
Pharmacy note - Dificid  Prescription sent to [facility] outpatient pharmacy for Dificid 259m BID x 10 days.  Pharmacy staff will bring to Mr. EPinntoday and his wife will bring it home to use when he is discharged.  He has a $0 copay.  JHeide Guile PharmD, BCPS-AQ ID Clinical Pharmacist Phone [phone]

## 2020-10-20 NOTE — TOC Benefit Eligibility Note (Signed)
Patient Teacher, English as a foreign language completed.    The patient is currently admitted and upon discharge could be taking Dificid 200 mg.  The current 30 day co-pay is, $0.00.   The patient is insured through [employer] [place], [place] Patient Advocate Specialist [place] Team Direct Number: [phone]  Fax: [phone]

## 2020-10-20 NOTE — Progress Notes (Addendum)
Subjective: No new complaints, Austin Chang is feeling better and Austin Chang only has had 2 bowel movements in the last 24 hours   Antibiotics:  Anti-infectives (From admission, onward)    Start     Dose/Rate Route Frequency Ordered Stop   10/20/20 0000  fidaxomicin (DIFICID) 200 MG TABS tablet        200 mg Oral 2 times daily 10/20/20 1122 10/30/20 2359   10/19/20 1600  vancomycin (VANCOREADY) IVPB 1750 mg/350 mL  Status:  Discontinued        1,750 mg 175 mL/hr over 120 Minutes Intravenous Every 24 hours 10/18/20 2212 10/18/20 2318   10/19/20 1000  fidaxomicin (DIFICID) tablet 200 mg        200 mg Oral 2 times daily 10/19/20 0850 10/29/20 0959   10/19/20 0800  metroNIDAZOLE (FLAGYL) IVPB 500 mg  Status:  Discontinued        500 mg 100 mL/hr over 60 Minutes Intravenous Every 12 hours 10/18/20 2127 10/18/20 2318   10/19/20 0700  metroNIDAZOLE (FLAGYL) IVPB 500 mg  Status:  Discontinued        500 mg 100 mL/hr over 60 Minutes Intravenous Every 8 hours 10/19/20 0608 10/19/20 0850   10/19/20 0000  ceFEPIme (MAXIPIME) 2 g in sodium chloride 0.9 % 100 mL IVPB  Status:  Discontinued        2 g 200 mL/hr over 30 Minutes Intravenous Every 8 hours 10/18/20 2212 10/18/20 2318   10/18/20 1600  vancomycin (VANCOREADY) IVPB 2000 mg/400 mL        2,000 mg 200 mL/hr over 120 Minutes Intravenous  Once 10/18/20 1549 10/18/20 1856   10/18/20 1545  ceFEPIme (MAXIPIME) 2 g in sodium chloride 0.9 % 100 mL IVPB        2 g 200 mL/hr over 30 Minutes Intravenous  Once 10/18/20 1543 10/18/20 1647   10/18/20 1545  metroNIDAZOLE (FLAGYL) IVPB 500 mg        500 mg 100 mL/hr over 60 Minutes Intravenous  Once 10/18/20 1543 10/18/20 2045   10/18/20 1545  vancomycin (VANCOCIN) IVPB 1000 mg/200 mL premix  Status:  Discontinued        1,000 mg 200 mL/hr over 60 Minutes Intravenous  Once 10/18/20 1543 10/18/20 1549       Medications: Scheduled Meds:  Chlorhexidine Gluconate Cloth  6 each Topical Daily    fidaxomicin  200 mg Oral BID   Continuous Infusions: PRN Meds:.acetaminophen, morphine injection, ondansetron (ZOFRAN) IV    Objective: Weight change: -3.531 kg  Intake/Output Summary (Last 24 hours) at 10/20/2020 1307 Last data filed at 10/20/2020 1212 Gross per 24 hour  Intake 848 ml  Output 3875 ml  Net -3027 ml   Blood pressure 129/69, pulse 91, temperature (!) 97.3 F (36.3 C), temperature source Axillary, resp. rate (!) 24, height 5' 8"  (1.727 m), weight 107.6 kg, SpO2 95 %. Temp:  [97.3 F (36.3 C)-98.9 F (37.2 C)] 97.3 F (36.3 C) (09/22 1212) Pulse Rate:  [83-101] 91 (09/22 1212) Resp:  [20-32] 24 (09/22 1212) BP: (109-130)/(64-80) 129/69 (09/22 1212) SpO2:  [89 %-100 %] 95 % (09/22 1212) Weight:  [107.6 kg] 107.6 kg (09/22 0500)  Physical Exam: Physical Exam Constitutional:      Appearance: Austin Chang is well-developed. Austin Chang is obese.  HENT:     Head: Normocephalic and atraumatic.  Eyes:     Conjunctiva/sclera: Conjunctivae normal.  Cardiovascular:     Rate and Rhythm: Regular rhythm. Tachycardia  present.  Pulmonary:     Effort: Pulmonary effort is normal. No respiratory distress.     Breath sounds: No stridor. No wheezing.  Abdominal:     General: There is no distension.     Palpations: Abdomen is soft.  Musculoskeletal:        General: Normal range of motion.     Cervical back: Normal range of motion and neck supple.  Skin:    General: Skin is warm and dry.     Findings: No erythema or rash.  Neurological:     General: No focal deficit present.     Mental Status: Austin Chang is alert and oriented to person, place, and time.  Psychiatric:        Mood and Affect: Mood normal.        Behavior: Behavior normal.        Thought Content: Thought content normal.        Judgment: Judgment normal.     CBC:    BMET Recent Labs    10/18/20 1537 10/19/20 0038  NA 130* 134*  K 3.5 3.7  CL 92* 98  CO2 27 30  GLUCOSE 188* 149*  BUN 30* 19  CREATININE 1.05 0.74   CALCIUM 7.5* 8.0*     Liver Panel  Recent Labs    10/18/20 1537  PROT 5.5*  ALBUMIN 2.2*  AST 29  ALT 20  ALKPHOS 172*  BILITOT 0.9       Sedimentation Rate Recent Labs    10/18/20 1537  ESRSEDRATE 100*   C-Reactive Protein Recent Labs    10/18/20 1537  CRP 15.3*    Micro Results: Recent Results (from the past 720 hour(s))  Resp Panel by RT-PCR (Flu A&B, Covid) Nasopharyngeal Swab     Status: None   Collection Time: 10/06/20  2:08 PM   Specimen: Nasopharyngeal Swab; Nasopharyngeal(NP) swabs in vial transport medium  Result Value Ref Range Status   SARS Coronavirus 2 by RT PCR NEGATIVE NEGATIVE Final    Comment: (NOTE) SARS-CoV-2 target nucleic acids are NOT DETECTED.  The SARS-CoV-2 RNA is generally detectable in upper respiratory specimens during the acute phase of infection. The lowest concentration of SARS-CoV-2 viral copies this assay can detect is 138 copies/mL. A negative result does not preclude SARS-Cov-2 infection and should not be used as the sole basis for treatment or other patient management decisions. A negative result may occur with  improper specimen collection/handling, submission of specimen other than nasopharyngeal swab, presence of viral mutation(s) within the areas targeted by this assay, and inadequate number of viral copies(<138 copies/mL). A negative result must be combined with clinical observations, patient history, and epidemiological information. The expected result is Negative.  Fact Sheet for Patients:  EntrepreneurPulse.com.au  Fact Sheet for Healthcare Providers:  IncredibleEmployment.be  This test is no t yet approved or cleared by the [place] FDA and  has been authorized for detection and/or diagnosis of SARS-CoV-2 by FDA under an Emergency Use Authorization (EUA). This EUA will remain  in effect (meaning this test can be used) for the duration of the COVID-19 declaration  under Section 564(b)(1) of the Act, 21 U.S.C.section 360bbb-3(b)(1), unless the authorization is terminated  or revoked sooner.       Influenza A by PCR NEGATIVE NEGATIVE Final   Influenza B by PCR NEGATIVE NEGATIVE Final    Comment: (NOTE) The Xpert Xpress SARS-CoV-2/FLU/RSV plus assay is intended as an aid in the diagnosis of influenza from Nasopharyngeal swab specimens and  should not be used as a sole basis for treatment. Nasal washings and aspirates are unacceptable for Xpert Xpress SARS-CoV-2/FLU/RSV testing.  Fact Sheet for Patients: EntrepreneurPulse.com.au  Fact Sheet for Healthcare Providers: IncredibleEmployment.be  This test is not yet approved or cleared by the [place] FDA and has been authorized for detection and/or diagnosis of SARS-CoV-2 by FDA under an Emergency Use Authorization (EUA). This EUA will remain in effect (meaning this test can be used) for the duration of the COVID-19 declaration under Section 564(b)(1) of the Act, 21 U.S.C. section 360bbb-3(b)(1), unless the authorization is terminated or revoked.  Performed at [facility], [address] [place], [place] [address]   Blood culture (routine x 2)     Status: None   Collection Time: 10/06/20  7:10 PM   Specimen: Right Antecubital; Blood  Result Value Ref Range Status   Specimen Description   Final    RIGHT ANTECUBITAL Performed at [facility], [address] [place], [place] [address]    Special Requests   Final    BOTTLES DRAWN AEROBIC AND ANAEROBIC Blood Culture results may not be optimal due to an inadequate volume of blood received in culture bottles Performed at [facility], [address] [place], [place] [address]    Culture   Final    NO GROWTH 5 DAYS Performed at [facility], [place] [address] [place], [place] [address]    Report Status 10/11/2020 FINAL  Final  Blood culture (routine x 2)      Status: None   Collection Time: 10/06/20  7:24 PM   Specimen: BLOOD RIGHT WRIST  Result Value Ref Range Status   Specimen Description   Final    BLOOD RIGHT WRIST Performed at [facility], [address] [place], [place] [address]    Special Requests   Final    BOTTLES DRAWN AEROBIC AND ANAEROBIC Blood Culture adequate volume Performed at [facility], [address] [place], [place] [address]    Culture   Final    NO GROWTH 5 DAYS Performed at [facility], [place] [address] [place], [place] [address]    Report Status 10/11/2020 FINAL  Final  Respiratory (~20 pathogens) panel by PCR     Status: None   Collection Time: 10/06/20  7:51 PM   Specimen: Nasopharyngeal Swab; Respiratory  Result Value Ref Range Status   Adenovirus NOT DETECTED NOT DETECTED Final   Coronavirus 229E NOT DETECTED NOT DETECTED Final    Comment: (NOTE) The Coronavirus on the Respiratory Panel, DOES NOT test for the novel  Coronavirus (2019 nCoV)    Coronavirus HKU1 NOT DETECTED NOT DETECTED Final   Coronavirus NL63 NOT DETECTED NOT DETECTED Final   Coronavirus OC43 NOT DETECTED NOT DETECTED Final   Metapneumovirus NOT DETECTED NOT DETECTED Final   Rhinovirus / Enterovirus NOT DETECTED NOT DETECTED Final   Influenza A NOT DETECTED NOT DETECTED Final   Influenza B NOT DETECTED NOT DETECTED Final   Parainfluenza Virus 1 NOT DETECTED NOT DETECTED Final   Parainfluenza Virus 2 NOT DETECTED NOT DETECTED Final   Parainfluenza Virus 3 NOT DETECTED NOT DETECTED Final   Parainfluenza Virus 4 NOT DETECTED NOT DETECTED Final   Respiratory Syncytial Virus NOT DETECTED NOT DETECTED Final   Bordetella pertussis NOT DETECTED NOT DETECTED Final   Bordetella Parapertussis NOT DETECTED NOT DETECTED Final   Chlamydophila pneumoniae NOT DETECTED NOT  DETECTED Final   Mycoplasma pneumoniae NOT DETECTED NOT DETECTED Final    Comment: Performed at [facility], [place] [address]  [place], [place] [address]  Resp Panel by RT-PCR (Flu A&B, Covid) Nasopharyngeal Swab     Status: None   Collection Time: 10/06/20  7:51 PM   Specimen: Nasopharyngeal Swab; Nasopharyngeal(NP) swabs in vial transport medium  Result Value Ref Range Status   SARS Coronavirus 2 by RT PCR NEGATIVE NEGATIVE Final    Comment: (NOTE) SARS-CoV-2 target nucleic acids are NOT DETECTED.  The SARS-CoV-2 RNA is generally detectable in upper respiratory specimens during the acute phase of infection. The lowest concentration of SARS-CoV-2 viral copies this assay can detect is 138 copies/mL. A negative result does not preclude SARS-Cov-2 infection and should not be used as the sole basis for treatment or other patient management decisions. A negative result may occur with  improper specimen collection/handling, submission of specimen other than nasopharyngeal swab, presence of viral mutation(s) within the areas targeted by this assay, and inadequate number of viral copies(<138 copies/mL). A negative result must be combined with clinical observations, patient history, and epidemiological information. The expected result is Negative.  Fact Sheet for Patients:  EntrepreneurPulse.com.au  Fact Sheet for Healthcare Providers:  IncredibleEmployment.be  This test is no t yet approved or cleared by the [place] FDA and  has been authorized for detection and/or diagnosis of SARS-CoV-2 by FDA under an Emergency Use Authorization (EUA). This EUA will remain  in effect (meaning this test can be used) for the duration of the COVID-19 declaration under Section 564(b)(1) of the Act, 21 U.S.C.section 360bbb-3(b)(1), unless the authorization is terminated  or revoked sooner.       Influenza A by PCR NEGATIVE NEGATIVE Final   Influenza B by PCR NEGATIVE NEGATIVE Final    Comment: (NOTE) The Xpert Xpress SARS-CoV-2/FLU/RSV plus assay is intended as an aid in the diagnosis of  influenza from Nasopharyngeal swab specimens and should not be used as a sole basis for treatment. Nasal washings and aspirates are unacceptable for Xpert Xpress SARS-CoV-2/FLU/RSV testing.  Fact Sheet for Patients: EntrepreneurPulse.com.au  Fact Sheet for Healthcare Providers: IncredibleEmployment.be  This test is not yet approved or cleared by the [place] FDA and has been authorized for detection and/or diagnosis of SARS-CoV-2 by FDA under an Emergency Use Authorization (EUA). This EUA will remain in effect (meaning this test can be used) for the duration of the COVID-19 declaration under Section 564(b)(1) of the Act, 21 U.S.C. section 360bbb-3(b)(1), unless the authorization is terminated or revoked.  Performed at [facility], [address] [place], [place] [address]   MRSA Next Gen by PCR, Nasal     Status: None   Collection Time: 10/07/20 11:53 AM   Specimen: Nasal Mucosa; Nasal Swab  Result Value Ref Range Status   MRSA by PCR Next Gen NOT DETECTED NOT DETECTED Final    Comment: (NOTE) The GeneXpert MRSA Assay (FDA approved for NASAL specimens only), is one component of a comprehensive MRSA colonization surveillance program. It is not intended to diagnose MRSA infection nor to guide or monitor treatment for MRSA infections. Test performance is not FDA approved in patients less than 63 years old. Performed at [facility], [place] [address] [place] [address] [place], [place] [address]   Culture, blood (routine x 2)     Status: None   Collection Time: 10/12/20  8:49 AM   Specimen: BLOOD RIGHT ARM  Result Value Ref  Range Status   Specimen Description BLOOD RIGHT ARM  Final   Special Requests   Final    BOTTLES DRAWN AEROBIC AND ANAEROBIC Blood Culture adequate volume   Culture   Final    NO GROWTH 5 DAYS Performed at [facility], [address], [place], [place] [address]    Report Status 10/17/2020 FINAL  Final   Culture, blood (routine x 2)     Status: None   Collection Time: 10/12/20  9:03 AM   Specimen: BLOOD RIGHT HAND  Result Value Ref Range Status   Specimen Description BLOOD RIGHT HAND  Final   Special Requests   Final    BOTTLES DRAWN AEROBIC AND ANAEROBIC Blood Culture adequate volume   Culture   Final    NO GROWTH 5 DAYS Performed at [facility], [place] [address], [place], [place] [address]    Report Status 10/17/2020 FINAL  Final  Blood Culture (routine x 2)     Status: None (Preliminary result)   Collection Time: 10/18/20  3:37 PM   Specimen: BLOOD  Result Value Ref Range Status   Specimen Description   Final    BLOOD LEFT ANTECUBITAL Performed at [facility] [address], [place], [place] [address]    Special Requests   Final    BOTTLES DRAWN AEROBIC AND ANAEROBIC Blood Culture adequate volume Performed at [facility] [address] [place], [place] [address]    Culture   Final    NO GROWTH 2 DAYS Performed at [facility] [address] [place], [place] [address]    Report Status PENDING  Incomplete  Blood Culture (routine x 2)     Status: None (Preliminary result)   Collection Time: 10/18/20  3:42 PM   Specimen: BLOOD  Result Value Ref Range Status   Specimen Description   Final    BLOOD RIGHT ANTECUBITAL Performed at [facility] [address], [place], [place] [address]    Special Requests   Final    BOTTLES DRAWN AEROBIC AND ANAEROBIC Blood Culture results may not be optimal due to an excessive volume of blood received in culture bottles Performed at [facility] [address] [place], [place] [address]    Culture   Final    NO GROWTH 2 DAYS Performed at [facility] [address] [place], [place] [address]    Report Status PENDING  Incomplete  Urine Culture     Status: None   Collection Time: 10/18/20  6:25 PM   Specimen: In/Out Cath Urine  Result  Value Ref Range Status   Specimen Description   Final    IN/OUT CATH URINE Performed at [facility] [address] [place], [place] [address]    Special Requests   Final    NONE Performed at [facility], [place] [address], [place], [place] [address]    Culture   Final    NO GROWTH Performed at [facility], [place] [address], [place], [place] [address]    Report Status 10/20/2020 FINAL  Final  Resp Panel by RT-PCR (Flu A&B, Covid) Nasopharyngeal Swab     Status: None   Collection Time: 10/18/20  6:36 PM   Specimen: Nasopharyngeal Swab; Nasopharyngeal(NP) swabs in vial transport medium  Result Value Ref Range Status   SARS Coronavirus 2 by RT PCR NEGATIVE NEGATIVE Final    Comment: (NOTE) SARS-CoV-2  target nucleic acids are NOT DETECTED.  The SARS-CoV-2 RNA is generally detectable in upper respiratory specimens during the acute phase of infection. The lowest concentration of SARS-CoV-2 viral copies this assay can detect is 138 copies/mL. A negative result does not preclude SARS-Cov-2 infection and should not be used as the sole basis for treatment or other patient management decisions. A negative result may occur with  improper specimen collection/handling, submission of specimen other than nasopharyngeal swab, presence of viral mutation(s) within the areas targeted by this assay, and inadequate number of viral copies(<138 copies/mL). A negative result must be combined with clinical observations, patient history, and epidemiological information. The expected result is Negative.  Fact Sheet for Patients:  EntrepreneurPulse.com.au  Fact Sheet for Healthcare Providers:  IncredibleEmployment.be  This test is no t yet approved or cleared by the [place] FDA and  has been authorized for detection and/or diagnosis of SARS-CoV-2 by FDA under an Emergency Use Authorization (EUA). This EUA will remain   in effect (meaning this test can be used) for the duration of the COVID-19 declaration under Section 564(b)(1) of the Act, 21 U.S.C.section 360bbb-3(b)(1), unless the authorization is terminated  or revoked sooner.       Influenza A by PCR NEGATIVE NEGATIVE Final   Influenza B by PCR NEGATIVE NEGATIVE Final    Comment: (NOTE) The Xpert Xpress SARS-CoV-2/FLU/RSV plus assay is intended as an aid in the diagnosis of influenza from Nasopharyngeal swab specimens and should not be used as a sole basis for treatment. Nasal washings and aspirates are unacceptable for Xpert Xpress SARS-CoV-2/FLU/RSV testing.  Fact Sheet for Patients: EntrepreneurPulse.com.au  Fact Sheet for Healthcare Providers: IncredibleEmployment.be  This test is not yet approved or cleared by the [place] FDA and has been authorized for detection and/or diagnosis of SARS-CoV-2 by FDA under an Emergency Use Authorization (EUA). This EUA will remain in effect (meaning this test can be used) for the duration of the COVID-19 declaration under Section 564(b)(1) of the Act, 21 U.S.C. section 360bbb-3(b)(1), unless the authorization is terminated or revoked.  Performed at [facility], [place] [address] [place], [place] [address]   Resp panel by RT-PCR (RSV, Flu A&B, Covid)     Status: None   Collection Time: 10/18/20  9:30 PM  Result Value Ref Range Status   SARS Coronavirus 2 by RT PCR NEGATIVE NEGATIVE Final    Comment: (NOTE) SARS-CoV-2 target nucleic acids are NOT DETECTED.  The SARS-CoV-2 RNA is generally detectable in upper respiratory specimens during the acute phase of infection. The lowest concentration of SARS-CoV-2 viral copies this assay can detect is 138 copies/mL. A negative result does not preclude SARS-Cov-2 infection and should not be used as the sole basis for treatment or other patient management decisions. A negative result may occur with   improper specimen collection/handling, submission of specimen other than nasopharyngeal swab, presence of viral mutation(s) within the areas targeted by this assay, and inadequate number of viral copies(<138 copies/mL). A negative result must be combined with clinical observations, patient history, and epidemiological information. The expected result is Negative.  Fact Sheet for Patients:  EntrepreneurPulse.com.au  Fact Sheet for Healthcare Providers:  IncredibleEmployment.be  This test is no t yet approved or cleared by the [place] FDA and  has been authorized for detection and/or diagnosis of SARS-CoV-2 by FDA under an Emergency Use Authorization (EUA). This EUA will remain  in effect (meaning this test can be used) for the duration of the COVID-19 declaration under  Section 564(b)(1) of the Act, 21 U.S.C.section 360bbb-3(b)(1), unless the authorization is terminated  or revoked sooner.       Influenza A by PCR NEGATIVE NEGATIVE Final   Influenza B by PCR NEGATIVE NEGATIVE Final    Comment: (NOTE) The Xpert Xpress SARS-CoV-2/FLU/RSV plus assay is intended as an aid in the diagnosis of influenza from Nasopharyngeal swab specimens and should not be used as a sole basis for treatment. Nasal washings and aspirates are unacceptable for Xpert Xpress SARS-CoV-2/FLU/RSV testing.  Fact Sheet for Patients: EntrepreneurPulse.com.au  Fact Sheet for Healthcare Providers: IncredibleEmployment.be  This test is not yet approved or cleared by the [place] FDA and has been authorized for detection and/or diagnosis of SARS-CoV-2 by FDA under an Emergency Use Authorization (EUA). This EUA will remain in effect (meaning this test can be used) for the duration of the COVID-19 declaration under Section 564(b)(1) of the Act, 21 U.S.C. section 360bbb-3(b)(1), unless the authorization is terminated or revoked.      Resp Syncytial Virus by PCR NEGATIVE NEGATIVE Final    Comment: (NOTE) Fact Sheet for Patients: EntrepreneurPulse.com.au  Fact Sheet for Healthcare Providers: IncredibleEmployment.be  This test is not yet approved or cleared by the [place] FDA and has been authorized for detection and/or diagnosis of SARS-CoV-2 by FDA under an Emergency Use Authorization (EUA). This EUA will remain in effect (meaning this test can be used) for the duration of the COVID-19 declaration under Section 564(b)(1) of the Act, 21 U.S.C. section 360bbb-3(b)(1), unless the authorization is terminated or revoked.  Performed at [facility], [place] [address], [place], [place] [address]   MRSA Next Gen by PCR, Nasal     Status: None   Collection Time: 10/19/20 12:28 AM   Specimen: Nasal Mucosa; Nasal Swab  Result Value Ref Range Status   MRSA by PCR Next Gen NOT DETECTED NOT DETECTED Final    Comment: (NOTE) The GeneXpert MRSA Assay (FDA approved for NASAL specimens only), is one component of a comprehensive MRSA colonization surveillance program. It is not intended to diagnose MRSA infection nor to guide or monitor treatment for MRSA infections. Test performance is not FDA approved in patients less than 55 years old. Performed at [facility], [place] [address] [place], [place] [address]   Culture, blood (routine x 2)     Status: None (Preliminary result)   Collection Time: 10/19/20 12:38 AM   Specimen: BLOOD  Result Value Ref Range Status   Specimen Description   Final    BLOOD BLOOD LEFT WRIST Performed at [facility] [address] [place], [place] [address]    Special Requests   Final    BOTTLES DRAWN AEROBIC ONLY Blood Culture adequate volume Performed at [facility] [address] [place], [place] [address]    Culture   Final    NO GROWTH 1 DAY Performed at [facility], [place] [address], [place], [place] [address]    Report Status PENDING  Incomplete  Culture, blood (routine x 2)     Status: None (Preliminary result)   Collection Time: 10/19/20 12:38 AM   Specimen: BLOOD  Result Value Ref Range Status   Specimen Description   Final    BLOOD BLOOD RIGHT ARM Performed at [facility] [address], [place], [place] [address]    Special Requests   Final    BOTTLES DRAWN AEROBIC ONLY Blood Culture adequate volume Performed at [facility]  [place] [address] [place], [place] [address]    Culture   Final    NO GROWTH 1 DAY Performed at [facility], [place] [address], [place], [place] [address]    Report Status PENDING  Incomplete  C Difficile Quick Screen w PCR reflex     Status: Abnormal   Collection Time: 10/19/20  3:46 AM   Specimen: STOOL  Result Value Ref Range Status   C Diff antigen POSITIVE (A) NEGATIVE Final   C Diff toxin POSITIVE (A) NEGATIVE Final   C Diff interpretation Toxin producing C. difficile detected.  Final    Comment: CRITICAL RESULT CALLED TO, READ BACK BY AND VERIFIED WITH: TROI BARKSDALE RN.@[email or web address]  ON 9.21.22 BY TCALDWELL MT. Performed at [facility] [address] [place], [place] [address]     Studies/Results: CT HEAD WO CONTRAST (5MM)  Result Date: 10/18/2020 CLINICAL DATA:  Mental status change EXAM: CT HEAD WITHOUT CONTRAST TECHNIQUE: Contiguous axial images were obtained from the base of the skull through the vertex without intravenous contrast. COMPARISON:  None. FINDINGS: Brain: No evidence of acute infarction, hemorrhage, cerebral edema, mass, mass effect, or midline shift. Ventricles and sulci are within normal limits for age. No extra-axial fluid collection. Periventricular white matter changes, likely the sequela of chronic small vessel ischemic disease. Vascular: No hyperdense vessel or unexpected calcification. Skull: Normal. Negative for  fracture or focal lesion. Sinuses/Orbits: Air-fluid levels in the maxillary sinuses with bubbly fluid. Trace mucosal thickening in the right anterior ethmoid air cells. Thickening of the walls of the maxillary sinus, as can be seen with a history of chronic sinusitis. Normal orbits. Other: Fluid in the left greater than right mastoid air cells. IMPRESSION: 1. No acute intracranial process. 2. Air-fluid levels with bubbly fluid in the bilateral maxillary sinuses, as can be seen in setting of acute sinusitis. Correlate with symptoms. Electronically Signed   By: Merilyn Baba M.D.   On: 10/18/2020 19:53   CT CHEST ABDOMEN PELVIS W CONTRAST  Result Date: 10/18/2020 CLINICAL DATA:  Sepsis Unclear source of infection, concern for epidural abscess but patient refusing MRI EXAM: CT CHEST, ABDOMEN, AND PELVIS WITH CONTRAST TECHNIQUE: Multidetector CT imaging of the chest, abdomen and pelvis was performed following the standard protocol during bolus administration of intravenous contrast. CONTRAST:  122m OMNIPAQUE IOHEXOL 350 MG/ML SOLN COMPARISON:  Abdominopelvic CT 10/10/2020, chest CTA 10/06/2020 FINDINGS: CT CHEST FINDINGS Cardiovascular: Normal caliber thoracic aorta. Aortic atherosclerosis. No acute aortic findings. Heart is normal in size. There are coronary artery calcifications. No pericardial effusion. No central pulmonary emboli on this non CTA exam. No pericardial effusion. Mediastinum/Nodes: No enlarged mediastinal or hilar lymph nodes. No axillary adenopathy. No esophageal wall thickening. No thyroid nodule. Lungs/Pleura: Breathing motion artifact. Scattered areas of subsegmental atelectasis. No confluent airspace disease or pneumonia. Trace left pleural effusion/thickening. Findings of pulmonary edema. Pulmonary mass. Musculoskeletal: Thoracic spine better assessed on concurrent thoracic spine CT, reported separately. No findings of extra-spinal musculoskeletal infection. No chest wall soft tissue  collection or abnormality. CT ABDOMEN PELVIS FINDINGS Hepatobiliary: Again seen hepatic steatosis without focal hepatic abnormality. No intrahepatic collection. No biliary dilatation. Multiple gallstones in the gallbladder. Equivocal gallbladder wall thickening, limited due to motion. No visualized choledocholithiasis. Pancreas: No ductal dilatation or inflammation. Spleen: Upper normal in size without focal abnormality. Adrenals/Urinary Tract: No adrenal nodule. Cortical scarring in the mid right kidney. No hydronephrosis. Homogeneous enhancement with symmetric excretion on delayed phase imaging. No evidence of focal  renal lesion or stone. Bladder previously deviating into the left pelvis is not demonstrated on the current exam. There is no bladder wall thickening. Stomach/Bowel: Scattered air and fluid in the colon without wall thickening or colonic inflammation. The appendix is not seen. Majority of small bowel is decompressed. No small bowel inflammation. Few small bowel loops opposed the left anterolateral abdominal wall suggesting adhesions, but no obstruction. Vascular/Lymphatic: Patent portal vein. Aortic atherosclerosis. Scattered central mesenteric and retroperitoneal lymph nodes, not enlarged by size criteria. Small calcified lymph nodes in the porta hepatis. No acute vascular findings Reproductive: Prostate is unremarkable. Other: No free air or ascites. Postsurgical change of the anterior abdominal wall. Mild body wall edema which is more prominent in the flanks, and increased on the left compared to right. Musculoskeletal: Lumbar spine assessed on concurrent lumbar spine CT, reported separately. Avascular necrosis of both femoral heads. Partial fusion of the sacroiliac joints consistent with history of ankylosing spondylitis. Ossification in the left gluteal medius small cells likely related to remote injury. No evidence of intramuscular collection. No hip joint effusion or findings of musculoskeletal  infection. IMPRESSION: 1. Cholelithiasis. Equivocal gallbladder wall thickening, detailed evaluation limited due to motion. 2. Occasional pulmonary atelectasis. No pneumonia or evidence of intrathoracic infection. 3. Mild body wall edema which is more prominent in the flanks, and increased on the left compared to right. This may be due to third-spacing. 4. Hepatic steatosis. 5. 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 6. Please reference thoracic and lumbar spine CT reported separately for spinal assessment. Aortic Atherosclerosis (ICD10-I70.0). Electronically Signed   By: Keith Rake M.D.   On: 10/18/2020 20:00   CT T-SPINE NO CHARGE  Result Date: 10/18/2020 CLINICAL DATA:  Initial evaluation for sepsis, concern for epidural abscess. EXAM: CT THORACIC AND LUMBAR SPINE WITHOUT CONTRAST TECHNIQUE: Multidetector CT imaging of the thoracic and lumbar spine was performed without contrast. Multiplanar CT image reconstructions were also generated. COMPARISON:  None. FINDINGS: CT THORACIC SPINE FINDINGS Alignment: Physiologic with preservation of the normal thoracic kyphosis. No listhesis. Vertebrae: Diffusely flowing syndesmophytes seen throughout the thoracic spine with fusion of the posterior elements, consistent with ankylosing spondylitis. Vertebral body height maintained with no visible acute or chronic fracture. No discrete or worrisome osseous lesions. Postsurgical changes partially visualized at the cervicothoracic junction. No visible hardware complication. Paraspinal and other soft tissues: Paraspinous soft tissues demonstrate no acute finding. Chronic fatty atrophy noted throughout the posterior paraspinous musculature. Mild atelectatic changes noted dependently within the visualized lungs. Scattered coronary artery calcifications with aortic atherosclerosis noted. Disc levels: No significant disc pathology seen within the thoracic spine. No significant stenosis or neural impingement.  No findings to suggest osteomyelitis discitis or septic arthritis. No visible epidural abscess or other collection. CT LUMBAR SPINE FINDINGS Segmentation: Standard. Lowest well-formed disc space labeled the L5-S1 level. Alignment: Mild exaggeration of the normal lumbar lordosis. No listhesis. Vertebrae: Vertebral body height maintained without acute or chronic fracture. Diffusely flowing syndesmophytes seen throughout the lumbar spine with ankylosis/fusion of the SI joints bilaterally, consistent with ankylosing spondylitis. No discrete or worrisome osseous lesions. No findings to suggest osteomyelitis discitis or septic arthritis. No discrete or worrisome osseous lesions. Paraspinal and other soft tissues: Paraspinous soft tissues demonstrate no acute finding. Chronic fatty atrophy noted within the posterior paraspinous musculature. Aortic atherosclerosis noted. Disc levels: L1-2: Negative interspace. Mild to moderate facet hypertrophy. No stenosis. L2-3: Negative interspace. Mild to moderate facet hypertrophy, greater on the right. No stenosis. L3-4: Negative interspace. Mild  to moderate facet hypertrophy. No significant stenosis. L4-5: Negative interspace. Moderate to advanced bilateral facet arthrosis. No significant spinal stenosis. Mild to moderate bilateral L4 foraminal narrowing. L5-S1: Disc desiccation with mild disc bulge. Severe bilateral facet arthrosis. No significant spinal stenosis. Moderate bilateral L5 foraminal narrowing. IMPRESSION: 1. No CT evidence for acute infection within the thoracolumbar spine. No visible epidural abscess or other collection. 2. Findings consistent with ankylosing spondylitis. 3. Moderate to advanced facet arthropathy at L4-5 and L5-S1 with associated moderate bilateral L4 and L5 foraminal stenosis. 4. Aortic Atherosclerosis (ICD10-I70.0). Electronically Signed   By: Jeannine Boga M.D.   On: 10/18/2020 20:04   CT L-SPINE NO CHARGE  Result Date:  10/18/2020 CLINICAL DATA:  Initial evaluation for sepsis, concern for epidural abscess. EXAM: CT THORACIC AND LUMBAR SPINE WITHOUT CONTRAST TECHNIQUE: Multidetector CT imaging of the thoracic and lumbar spine was performed without contrast. Multiplanar CT image reconstructions were also generated. COMPARISON:  None. FINDINGS: CT THORACIC SPINE FINDINGS Alignment: Physiologic with preservation of the normal thoracic kyphosis. No listhesis. Vertebrae: Diffusely flowing syndesmophytes seen throughout the thoracic spine with fusion of the posterior elements, consistent with ankylosing spondylitis. Vertebral body height maintained with no visible acute or chronic fracture. No discrete or worrisome osseous lesions. Postsurgical changes partially visualized at the cervicothoracic junction. No visible hardware complication. Paraspinal and other soft tissues: Paraspinous soft tissues demonstrate no acute finding. Chronic fatty atrophy noted throughout the posterior paraspinous musculature. Mild atelectatic changes noted dependently within the visualized lungs. Scattered coronary artery calcifications with aortic atherosclerosis noted. Disc levels: No significant disc pathology seen within the thoracic spine. No significant stenosis or neural impingement. No findings to suggest osteomyelitis discitis or septic arthritis. No visible epidural abscess or other collection. CT LUMBAR SPINE FINDINGS Segmentation: Standard. Lowest well-formed disc space labeled the L5-S1 level. Alignment: Mild exaggeration of the normal lumbar lordosis. No listhesis. Vertebrae: Vertebral body height maintained without acute or chronic fracture. Diffusely flowing syndesmophytes seen throughout the lumbar spine with ankylosis/fusion of the SI joints bilaterally, consistent with ankylosing spondylitis. No discrete or worrisome osseous lesions. No findings to suggest osteomyelitis discitis or septic arthritis. No discrete or worrisome osseous lesions.  Paraspinal and other soft tissues: Paraspinous soft tissues demonstrate no acute finding. Chronic fatty atrophy noted within the posterior paraspinous musculature. Aortic atherosclerosis noted. Disc levels: L1-2: Negative interspace. Mild to moderate facet hypertrophy. No stenosis. L2-3: Negative interspace. Mild to moderate facet hypertrophy, greater on the right. No stenosis. L3-4: Negative interspace. Mild to moderate facet hypertrophy. No significant stenosis. L4-5: Negative interspace. Moderate to advanced bilateral facet arthrosis. No significant spinal stenosis. Mild to moderate bilateral L4 foraminal narrowing. L5-S1: Disc desiccation with mild disc bulge. Severe bilateral facet arthrosis. No significant spinal stenosis. Moderate bilateral L5 foraminal narrowing. IMPRESSION: 1. No CT evidence for acute infection within the thoracolumbar spine. No visible epidural abscess or other collection. 2. Findings consistent with ankylosing spondylitis. 3. Moderate to advanced facet arthropathy at L4-5 and L5-S1 with associated moderate bilateral L4 and L5 foraminal stenosis. 4. Aortic Atherosclerosis (ICD10-I70.0). Electronically Signed   By: Jeannine Boga M.D.   On: 10/18/2020 20:04   DG Chest Port 1 View  Result Date: 10/18/2020 CLINICAL DATA:  Questionable sepsis. EXAM: PORTABLE CHEST 1 VIEW COMPARISON:  Chest x-ray 10/12/2020. FINDINGS: The cardiac silhouette appears enlarged, unchanged. There is central pulmonary vascular congestion. There is no lung consolidation. There is likely a small left pleural effusion. There is no pneumothorax or acute fracture. IMPRESSION: 1. Cardiomegaly with central  pulmonary vascular congestion. 2. Small left pleural effusion. Electronically Signed   By: Ronney Asters M.D.   On: 10/18/2020 16:31      Assessment/Plan:  INTERVAL HISTORY: The patient is responding to Dificid   Principal Problem:   GI bleed Active Problems:   Essential hypertension    Hyponatremia   Melena   FUO (fever of unknown origin)   Ulcerative colitis ([place])   Hypokalemia   Acute blood loss anemia   C. difficile colitis    CALDEN DORSEY is a 55 y.o. 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  Shortly after discharge she has been readmitted now with a GIB bleed and C. difficile colitis  #1 C. difficile colitis:  Continue Dificid and we have procured an additional 10 days of Dificid that Austin Chang can take as an outpatient once Austin Chang is stable for discharge  Is a good idea to continue to hold PPIs and any unnecessary antibiotics.  I certainly would not have him take Humira anytime soon  #2 recent febrile illness at [facility]: No cause was found:  Given that Austin Chang had this recent FUO that is another reason why I would hold off on Humira for now until Austin Chang is not some evidence of infection for several weeks if not a month.   3 GI Bleed; undergoing endoscopy for this and not thought to have an upper GI bleed  4  Ulcerative colitis: As mentioned I would hold Humira at present given C. difficile colitis clearly but also given the recent febrile illness.  5.  Abnormal SPEP could ask oncology their opinion about further work-up of this this was done as part of his FUO labs.  I will sign off for now please call with further questions.   LOS: 2 days   Austin Chang 10/20/2020, 1:07 PM

## 2020-10-21 ENCOUNTER — Telehealth: Payer: Self-pay | Admitting: Physician Assistant

## 2020-10-21 LAB — CBC
HCT: 23.2 % — ABNORMAL LOW (ref 39.0–52.0)
Hemoglobin: 7.9 g/dL — ABNORMAL LOW (ref 13.0–17.0)
MCH: 32.6 pg (ref 26.0–34.0)
MCHC: 34.1 g/dL (ref 30.0–36.0)
MCV: 95.9 fL (ref 80.0–100.0)
Platelets: 446 10*3/uL — ABNORMAL HIGH (ref 150–400)
RBC: 2.42 MIL/uL — ABNORMAL LOW (ref 4.22–5.81)
RDW: 13.5 % (ref 11.5–15.5)
WBC: 9.7 10*3/uL (ref 4.0–10.5)
nRBC: 0.2 % (ref 0.0–0.2)

## 2020-10-21 LAB — BASIC METABOLIC PANEL
Anion gap: 8 (ref 5–15)
BUN: 14 mg/dL (ref 6–20)
CO2: 27 mmol/L (ref 22–32)
Calcium: 8.2 mg/dL — ABNORMAL LOW (ref 8.9–10.3)
Chloride: 99 mmol/L (ref 98–111)
Creatinine, Ser: 0.68 mg/dL (ref 0.61–1.24)
GFR, Estimated: >60 mL/min (ref >60)
Glucose, Bld: 113 mg/dL — ABNORMAL HIGH (ref 70–99)
Potassium: 4.3 mmol/L (ref 3.5–5.1)
Sodium: 134 mmol/L — ABNORMAL LOW (ref 135–145)

## 2020-10-21 NOTE — Progress Notes (Signed)
Patient ambulated in room for 7 minutes on room air. Spo2 briefly dipped to 89% percent but recovered quickly and remained 100% throughout ambulation. Patient tolerated well.

## 2020-10-21 NOTE — Discharge Summary (Addendum)
Physician Discharge Summary  Austin Chang MBT:[id number] DOB: April 02, 1965  PCP: Josetta Huddle, MD  Admitted from: Home Discharged to: Home  Admit date: 10/18/2020 Discharge date: 10/21/2020  Recommendations for Outpatient Follow-up:    Follow-up Information     Josetta Huddle, MD. Schedule an appointment as soon as possible for a visit in 1 week(s).   Specialty: Internal Medicine Why: To be seen with repeat labs (CBC & CMP).  Also follow-up regarding timing of resumption of your blood pressure medicines and Lasix. Contact information: [address] [place] [address] [phone]         Arta Silence, MD. Schedule an appointment as soon as possible for a visit in 1 week(s).   Specialty: Gastroenterology Why: Please call for an appointment.  Follow-up regarding timing of resumption of Humira. Contact information: [address] [place] [address] [phone]         Orson Slick, MD Follow up.   Specialty: Hematology and Oncology Why: MDs office will call you for an appointment to be seen in 1 to 2 weeks.  Please call them if you do not hear from them within 2-3 business days.  This is to follow-up on some abnormal blood work (SPEP) during recent previous hospital admission. Contact information: [address] [place] [address] [phone]                  Home Health: None    Equipment/Devices: None    Discharge Condition: Improved and stable   Code Status: Full Code Diet recommendation:  Discharge Diet Orders (From admission, onward)     Start     Ordered   10/21/20 0000  Diet - low sodium heart healthy        10/21/20 1148             Discharge Diagnoses:  Principal Problem:   GI bleed Active Problems:   Essential hypertension   Hyponatremia   Melena   FUO (fever of unknown origin)   Ulcerative colitis ([place])   Hypokalemia   Acute blood loss anemia   C. difficile colitis    Abnormal SPEP   Brief Summary: PCCM to [facility] transfer 10/20/2020: 55 year old male with medical history significant for ankylosing spondylitis, ulcerative colitis on Humira, hypertension, diverticulosis, gout, recent hospitalization 10/06/2020 - 10/17/2020 for fever, ID consulted, no clear cause identified, treated with broad-spectrum antibiotics x7 days, now admitted with melena and bloody BMs, some lethargy and decreased mentation.  Admitted for C. difficile colitis with acute GI bleeding, acute blood loss anemia, superimposed on known history of ulcerative colitis.  Admitted to ICU, stabilized and transferred to [facility].  Discussed with [facility].  ID consulted.     Assessment & Plan:    C. difficile colitis: Recent hospitalization and exposure to broad-spectrum antibiotics for FUO.  ID input appreciated.  Started Dificid twice daily.  Now off steroids and PPI.  Improved.  Now having formed stools.  Tolerating diet.  As per ID signed off on 9/22: Continue Dificid and they have provided patient with an additional 10 days supply to him, hold off PPIs and any unnecessary antibiotics, hold Humira.  Close outpatient follow-up with his primary GI MD.   Acute GI bleed: Likely secondary to C. difficile colitis complicating underlying ulcerative colitis versus diverticulosis versus others.  Low index of suspicion for upper GI bleed.  Even if he did have an upper GI bleed, given history of C. difficile  colitis, PPI not a good idea.  Moreover his symptoms have now resolved.  Discontinued NSAID's but left him on his Cox 2 inhibitors which can be reviewed during close outpatient follow-up with PCP/GI.  PPI discontinued by PCCM.  I discussed with Dr. Michail Sermon, [facility] on 9/22, supportive care, no endoscopies planned, gradually advance diet as tolerated.  Due to C. difficile colitis and low index of suspicion for upper GI bleed, PPI discontinued.  Resolved.  Patient advised to seek immediate medical attention if he has any  recurrent bleeding symptoms i.e. coffee-ground emesis, vomiting blood, melena or blood in stools.   Acute blood loss anemia: Secondary to GI bleed and possible recent acute illness.  Hemoglobin on 10/06/2020: 14.1.  Admitting hemoglobin this time on 9/20: 7.7, dropped to 6.3, s/p 1 unit PRBC with appropriate response.  Hemoglobin again drifted down to 7 g per DL on 9/23.  Transfused an additional unit of PRBC and hemoglobin up to 7.9.  Anemia panel: Ferritin 958/likely acute phase reactant, folate 9.7, B12: 990.  Follow CBC closely as outpatient.   Ulcerative colitis on Humira at baseline: No evidence of acute ulcerative colitis on CT scan.  Holding Humira.  As per discussion with Dr. Michail Sermon 9/22, outpatient follow-up with Dr. Paulita Fujita to determine timing of resumption of Humira.  As per ID signoff note, would hold Humira at present given C. difficile colitis and recent febrile illness.  Recent FUO: Per ID, no cause was found.  They also recommend that given his recent FUO, would hold off on Humira for several weeks if not a month.  As part of FUO work-up, SPEP on 10/09/2020 noted some abnormality: The SPE pattern demonstrates 2 peaks in the beta-gamma region which may represent monoclonal protein.  A biclonal gammopathy may be confirmed by immunofixation, as well as evaluation of the urine for the presence of Bence-Jones protein.  10/14/2020: Immunofixation electrophoresis results: The immunofixation pattern appears unremarkable.  Evidence of monoclonal protein is not apparent.  I reviewed these results with Dr. Lorenso Courier, hematology/oncologist on-call.  He noted M spike of 0.2% and indicated that patient would need further evaluation and his office will contact patient with an appointment to be seen in 1 to 2 weeks.  SPEP abnormality/M spike: Please see discussion above   Essential hypertension: Controlled off meds.  Monitor.  Holding off antihypertensives until close outpatient follow-up with PCP.  Acute on  chronic diastolic CHF: Resolved after Lasix earlier in admission.  Recent DC summary did state that patient had decompensated CHF.  Currently euvolemic.  Given recent diarrheal illness, oral intake not yet up to marked, clinically euvolemic, holding off Lasix until close outpatient follow-up with his PCP to determine if it needs to be resumed.  Avascular necrosis of both femoral heads without collapse: Noted on CT.  May be related to his ulcerative colitis meds.  Outpatient follow-up.  Prediabetes: A1c 6.4.  Outpatient follow-up.   Resolved problems: Hyponatremia Hypokalemia Lactic acidosis    Body mass index is 36.07 kg/m.          Consultants:   PCCM Infectious disease   Procedures:   None   Discharge Instructions  Discharge Instructions     (HEART FAILURE PATIENTS) Call MD:  Anytime you have any of the following symptoms: 1) 3 pound weight gain in 24 hours or 5 pounds in 1 week 2) shortness of breath, with or without a dry hacking cough 3) swelling in the hands, feet or stomach 4) if you have to  sleep on extra pillows at night in order to breathe.   Complete by: As directed    Call MD for:   Complete by: As directed    Recurrent or worsening diarrhea.   Call MD for:  difficulty breathing, headache or visual disturbances   Complete by: As directed    Call MD for:  extreme fatigue   Complete by: As directed    Call MD for:  persistant dizziness or light-headedness   Complete by: As directed    Call MD for:  persistant nausea and vomiting   Complete by: As directed    Call MD for:  severe uncontrolled pain   Complete by: As directed    Call MD for:  temperature >100.4   Complete by: As directed    Diet - low sodium heart healthy   Complete by: As directed    Increase activity slowly   Complete by: As directed         Medication List     STOP taking these medications    furosemide 40 MG tablet Commonly known as: LASIX   Humira Pen 40 MG/0.4ML  Pnkt Generic drug: Adalimumab   indomethacin 50 MG capsule Commonly known as: INDOCIN   indomethacin 75 MG CR capsule Commonly known as: INDOCIN SR   Potassium 99 MG Tabs   valsartan 160 MG tablet Commonly known as: Diovan       TAKE these medications    acetaminophen 500 MG tablet Commonly known as: TYLENOL Take 500-1,000 mg by mouth every 6 (six) hours as needed for mild pain.   allopurinol 300 MG tablet Commonly known as: ZYLOPRIM Take 300 mg by mouth daily.   celecoxib 200 MG capsule Commonly known as: CELEBREX Take 1 capsule (200 mg total) by mouth 2 (two) times daily as needed.   Colchicine 0.6 MG Caps Take 0.6 mg by mouth daily as needed (gout flare up).   Dificid 200 MG Tabs tablet Generic drug: fidaxomicin Take 1 tablet (200 mg total) by mouth 2 (two) times daily for 10 days.   Magnesium 300 MG Caps Take 300 mg by mouth daily.   ondansetron 8 MG disintegrating tablet Commonly known as: ZOFRAN-ODT Take 8 mg by mouth every 8 (eight) hours as needed for nausea/vomiting.   thiamine 100 MG tablet Commonly known as: Vitamin B-1 Take 100 mg by mouth daily.   Vitamin D 50 MCG (2000 UT) Caps Take 2,000 Units by mouth daily.   zolpidem 10 MG tablet Commonly known as: AMBIEN Take 10 mg by mouth at bedtime as needed for sleep.       Allergies  Allergen Reactions   Septra [Bactrim] Other (See Comments)    Very bad migraine   Sulfamethoxazole-Trimethoprim     Other reaction(s): massive headache      Procedures/Studies: CT HEAD WO CONTRAST (5MM)  Result Date: 10/18/2020 CLINICAL DATA:  Mental status change EXAM: CT HEAD WITHOUT CONTRAST TECHNIQUE: Contiguous axial images were obtained from the base of the skull through the vertex without intravenous contrast. COMPARISON:  None. FINDINGS: Brain: No evidence of acute infarction, hemorrhage, cerebral edema, mass, mass effect, or midline shift. Ventricles and sulci are within normal limits for age. No  extra-axial fluid collection. Periventricular white matter changes, likely the sequela of chronic small vessel ischemic disease. Vascular: No hyperdense vessel or unexpected calcification. Skull: Normal. Negative for fracture or focal lesion. Sinuses/Orbits: Air-fluid levels in the maxillary sinuses with bubbly fluid. Trace mucosal thickening in the right anterior ethmoid  air cells. Thickening of the walls of the maxillary sinus, as can be seen with a history of chronic sinusitis. Normal orbits. Other: Fluid in the left greater than right mastoid air cells. IMPRESSION: 1. No acute intracranial process. 2. Air-fluid levels with bubbly fluid in the bilateral maxillary sinuses, as can be seen in setting of acute sinusitis. Correlate with symptoms. Electronically Signed   By: Merilyn Baba M.D.   On: 10/18/2020 19:53   CT CHEST ABDOMEN PELVIS W CONTRAST  Result Date: 10/18/2020 CLINICAL DATA:  Sepsis Unclear source of infection, concern for epidural abscess but patient refusing MRI EXAM: CT CHEST, ABDOMEN, AND PELVIS WITH CONTRAST TECHNIQUE: Multidetector CT imaging of the chest, abdomen and pelvis was performed following the standard protocol during bolus administration of intravenous contrast. CONTRAST:  131m OMNIPAQUE IOHEXOL 350 MG/ML SOLN COMPARISON:  Abdominopelvic CT 10/10/2020, chest CTA 10/06/2020 FINDINGS: CT CHEST FINDINGS Cardiovascular: Normal caliber thoracic aorta. Aortic atherosclerosis. No acute aortic findings. Heart is normal in size. There are coronary artery calcifications. No pericardial effusion. No central pulmonary emboli on this non CTA exam. No pericardial effusion. Mediastinum/Nodes: No enlarged mediastinal or hilar lymph nodes. No axillary adenopathy. No esophageal wall thickening. No thyroid nodule. Lungs/Pleura: Breathing motion artifact. Scattered areas of subsegmental atelectasis. No confluent airspace disease or pneumonia. Trace left pleural effusion/thickening. Findings of  pulmonary edema. Pulmonary mass. Musculoskeletal: Thoracic spine better assessed on concurrent thoracic spine CT, reported separately. No findings of extra-spinal musculoskeletal infection. No chest wall soft tissue collection or abnormality. CT ABDOMEN PELVIS FINDINGS Hepatobiliary: Again seen hepatic steatosis without focal hepatic abnormality. No intrahepatic collection. No biliary dilatation. Multiple gallstones in the gallbladder. Equivocal gallbladder wall thickening, limited due to motion. No visualized choledocholithiasis. Pancreas: No ductal dilatation or inflammation. Spleen: Upper normal in size without focal abnormality. Adrenals/Urinary Tract: No adrenal nodule. Cortical scarring in the mid right kidney. No hydronephrosis. Homogeneous enhancement with symmetric excretion on delayed phase imaging. No evidence of focal renal lesion or stone. Bladder previously deviating into the left pelvis is not demonstrated on the current exam. There is no bladder wall thickening. Stomach/Bowel: Scattered air and fluid in the colon without wall thickening or colonic inflammation. The appendix is not seen. Majority of small bowel is decompressed. No small bowel inflammation. Few small bowel loops opposed the left anterolateral abdominal wall suggesting adhesions, but no obstruction. Vascular/Lymphatic: Patent portal vein. Aortic atherosclerosis. Scattered central mesenteric and retroperitoneal lymph nodes, not enlarged by size criteria. Small calcified lymph nodes in the porta hepatis. No acute vascular findings Reproductive: Prostate is unremarkable. Other: No free air or ascites. Postsurgical change of the anterior abdominal wall. Mild body wall edema which is more prominent in the flanks, and increased on the left compared to right. Musculoskeletal: Lumbar spine assessed on concurrent lumbar spine CT, reported separately. Avascular necrosis of both femoral heads. Partial fusion of the sacroiliac joints consistent  with history of ankylosing spondylitis. Ossification in the left gluteal medius small cells likely related to remote injury. No evidence of intramuscular collection. No hip joint effusion or findings of musculoskeletal infection. IMPRESSION: 1. Cholelithiasis. Equivocal gallbladder wall thickening, detailed evaluation limited due to motion. 2. Occasional pulmonary atelectasis. No pneumonia or evidence of intrathoracic infection. 3. Mild body wall edema which is more prominent in the flanks, and increased on the left compared to right. This may be due to third-spacing. 4. Hepatic steatosis. 5. 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 6. Please reference thoracic and lumbar spine CT reported separately for  spinal assessment. Aortic Atherosclerosis (ICD10-I70.0). Electronically Signed   By: Keith Rake M.D.   On: 10/18/2020 20:00   CT T-SPINE NO CHARGE  Result Date: 10/18/2020 CLINICAL DATA:  Initial evaluation for sepsis, concern for epidural abscess. EXAM: CT THORACIC AND LUMBAR SPINE WITHOUT CONTRAST TECHNIQUE: Multidetector CT imaging of the thoracic and lumbar spine was performed without contrast. Multiplanar CT image reconstructions were also generated. COMPARISON:  None. FINDINGS: CT THORACIC SPINE FINDINGS Alignment: Physiologic with preservation of the normal thoracic kyphosis. No listhesis. Vertebrae: Diffusely flowing syndesmophytes seen throughout the thoracic spine with fusion of the posterior elements, consistent with ankylosing spondylitis. Vertebral body height maintained with no visible acute or chronic fracture. No discrete or worrisome osseous lesions. Postsurgical changes partially visualized at the cervicothoracic junction. No visible hardware complication. Paraspinal and other soft tissues: Paraspinous soft tissues demonstrate no acute finding. Chronic fatty atrophy noted throughout the posterior paraspinous musculature. Mild atelectatic changes noted dependently within  the visualized lungs. Scattered coronary artery calcifications with aortic atherosclerosis noted. Disc levels: No significant disc pathology seen within the thoracic spine. No significant stenosis or neural impingement. No findings to suggest osteomyelitis discitis or septic arthritis. No visible epidural abscess or other collection. CT LUMBAR SPINE FINDINGS Segmentation: Standard. Lowest well-formed disc space labeled the L5-S1 level. Alignment: Mild exaggeration of the normal lumbar lordosis. No listhesis. Vertebrae: Vertebral body height maintained without acute or chronic fracture. Diffusely flowing syndesmophytes seen throughout the lumbar spine with ankylosis/fusion of the SI joints bilaterally, consistent with ankylosing spondylitis. No discrete or worrisome osseous lesions. No findings to suggest osteomyelitis discitis or septic arthritis. No discrete or worrisome osseous lesions. Paraspinal and other soft tissues: Paraspinous soft tissues demonstrate no acute finding. Chronic fatty atrophy noted within the posterior paraspinous musculature. Aortic atherosclerosis noted. Disc levels: L1-2: Negative interspace. Mild to moderate facet hypertrophy. No stenosis. L2-3: Negative interspace. Mild to moderate facet hypertrophy, greater on the right. No stenosis. L3-4: Negative interspace. Mild to moderate facet hypertrophy. No significant stenosis. L4-5: Negative interspace. Moderate to advanced bilateral facet arthrosis. No significant spinal stenosis. Mild to moderate bilateral L4 foraminal narrowing. L5-S1: Disc desiccation with mild disc bulge. Severe bilateral facet arthrosis. No significant spinal stenosis. Moderate bilateral L5 foraminal narrowing. IMPRESSION: 1. No CT evidence for acute infection within the thoracolumbar spine. No visible epidural abscess or other collection. 2. Findings consistent with ankylosing spondylitis. 3. Moderate to advanced facet arthropathy at L4-5 and L5-S1 with associated  moderate bilateral L4 and L5 foraminal stenosis. 4. Aortic Atherosclerosis (ICD10-I70.0). Electronically Signed   By: Jeannine Boga M.D.   On: 10/18/2020 20:04   CT L-SPINE NO CHARGE  Result Date: 10/18/2020 CLINICAL DATA:  Initial evaluation for sepsis, concern for epidural abscess. EXAM: CT THORACIC AND LUMBAR SPINE WITHOUT CONTRAST TECHNIQUE: Multidetector CT imaging of the thoracic and lumbar spine was performed without contrast. Multiplanar CT image reconstructions were also generated. COMPARISON:  None. FINDINGS: CT THORACIC SPINE FINDINGS Alignment: Physiologic with preservation of the normal thoracic kyphosis. No listhesis. Vertebrae: Diffusely flowing syndesmophytes seen throughout the thoracic spine with fusion of the posterior elements, consistent with ankylosing spondylitis. Vertebral body height maintained with no visible acute or chronic fracture. No discrete or worrisome osseous lesions. Postsurgical changes partially visualized at the cervicothoracic junction. No visible hardware complication. Paraspinal and other soft tissues: Paraspinous soft tissues demonstrate no acute finding. Chronic fatty atrophy noted throughout the posterior paraspinous musculature. Mild atelectatic changes noted dependently within the visualized lungs. Scattered coronary artery calcifications with aortic atherosclerosis  noted. Disc levels: No significant disc pathology seen within the thoracic spine. No significant stenosis or neural impingement. No findings to suggest osteomyelitis discitis or septic arthritis. No visible epidural abscess or other collection. CT LUMBAR SPINE FINDINGS Segmentation: Standard. Lowest well-formed disc space labeled the L5-S1 level. Alignment: Mild exaggeration of the normal lumbar lordosis. No listhesis. Vertebrae: Vertebral body height maintained without acute or chronic fracture. Diffusely flowing syndesmophytes seen throughout the lumbar spine with ankylosis/fusion of the SI  joints bilaterally, consistent with ankylosing spondylitis. No discrete or worrisome osseous lesions. No findings to suggest osteomyelitis discitis or septic arthritis. No discrete or worrisome osseous lesions. Paraspinal and other soft tissues: Paraspinous soft tissues demonstrate no acute finding. Chronic fatty atrophy noted within the posterior paraspinous musculature. Aortic atherosclerosis noted. Disc levels: L1-2: Negative interspace. Mild to moderate facet hypertrophy. No stenosis. L2-3: Negative interspace. Mild to moderate facet hypertrophy, greater on the right. No stenosis. L3-4: Negative interspace. Mild to moderate facet hypertrophy. No significant stenosis. L4-5: Negative interspace. Moderate to advanced bilateral facet arthrosis. No significant spinal stenosis. Mild to moderate bilateral L4 foraminal narrowing. L5-S1: Disc desiccation with mild disc bulge. Severe bilateral facet arthrosis. No significant spinal stenosis. Moderate bilateral L5 foraminal narrowing. IMPRESSION: 1. No CT evidence for acute infection within the thoracolumbar spine. No visible epidural abscess or other collection. 2. Findings consistent with ankylosing spondylitis. 3. Moderate to advanced facet arthropathy at L4-5 and L5-S1 with associated moderate bilateral L4 and L5 foraminal stenosis. 4. Aortic Atherosclerosis (ICD10-I70.0). Electronically Signed   By: Jeannine Boga M.D.   On: 10/18/2020 20:04   DG Chest Port 1 View  Result Date: 10/18/2020 CLINICAL DATA:  Questionable sepsis. EXAM: PORTABLE CHEST 1 VIEW COMPARISON:  Chest x-ray 10/12/2020. FINDINGS: The cardiac silhouette appears enlarged, unchanged. There is central pulmonary vascular congestion. There is no lung consolidation. There is likely a small left pleural effusion. There is no pneumothorax or acute fracture. IMPRESSION: 1. Cardiomegaly with central pulmonary vascular congestion. 2. Small left pleural effusion. Electronically Signed   By: Ronney Asters M.D.   On: 10/18/2020 16:31     Subjective: Denies complaints.  Tolerating diet.  Now having formed BMs without black tarry stools.  Having normal colored stools.  Per nursing, ambulated for about 7 minutes on room air with oxygen saturation transiently at 89% but rebounded back to 90 and above.  No dyspnea reported.  Anxious to DC home.  Discharge Exam:  Vitals:   10/21/20 0335 10/21/20 0800 10/21/20 0821 10/21/20 0900  BP:  119/67  130/68  Pulse:  84  85  Resp:  16  20  Temp: 97.7 F (36.5 C)  98.1 F (36.7 C)   TempSrc: Oral  Oral   SpO2:  96%  98%  Weight:      Height:        General exam: Young male, moderately built and obese lying comfortably propped up in bed without distress.  Oral mucosa moist. Respiratory system: Clear to auscultation. Respiratory effort normal. Cardiovascular system: S1 & S2 heard, RRR. No JVD, murmurs, rubs, gallops or clicks. No pedal edema.  Not on telemetry. Gastrointestinal system: Abdomen is nondistended, soft and nontender. No organomegaly or masses felt. Normal bowel sounds heard. Central nervous system: Alert and oriented. No focal neurological deficits. Extremities: Symmetric 5 x 5 power. Skin: No rashes, lesions or ulcers Psychiatry: Judgement and insight appear normal. Mood & affect appropriate.     The results of significant diagnostics from this hospitalization (including imaging,  microbiology, ancillary and laboratory) are listed below for reference.     Microbiology: Recent Results (from the past 240 hour(s))  Culture, blood (routine x 2)     Status: None   Collection Time: 10/12/20  8:49 AM   Specimen: BLOOD RIGHT ARM  Result Value Ref Range Status   Specimen Description BLOOD RIGHT ARM  Final   Special Requests   Final    BOTTLES DRAWN AEROBIC AND ANAEROBIC Blood Culture adequate volume   Culture   Final    NO GROWTH 5 DAYS Performed at [facility], [address] [place], [place] [address]    Report  Status 10/17/2020 FINAL  Final  Culture, blood (routine x 2)     Status: None   Collection Time: 10/12/20  9:03 AM   Specimen: BLOOD RIGHT HAND  Result Value Ref Range Status   Specimen Description BLOOD RIGHT HAND  Final   Special Requests   Final    BOTTLES DRAWN AEROBIC AND ANAEROBIC Blood Culture adequate volume   Culture   Final    NO GROWTH 5 DAYS Performed at [facility], [place] [address] [place], [place] [address]    Report Status 10/17/2020 FINAL  Final  Blood Culture (routine x 2)     Status: None (Preliminary result)   Collection Time: 10/18/20  3:37 PM   Specimen: BLOOD  Result Value Ref Range Status   Specimen Description   Final    BLOOD LEFT ANTECUBITAL Performed at [facility] [address] [place], [place] [address]    Special Requests   Final    BOTTLES DRAWN AEROBIC AND ANAEROBIC Blood Culture adequate volume Performed at [facility] [address] [place], [place] [address]    Culture   Final    NO GROWTH 2 DAYS Performed at [facility] [address], [place], [place] [address]    Report Status PENDING  Incomplete  Blood Culture (routine x 2)     Status: None (Preliminary result)   Collection Time: 10/18/20  3:42 PM   Specimen: BLOOD  Result Value Ref Range Status   Specimen Description   Final    BLOOD RIGHT ANTECUBITAL Performed at [facility] [address], [place], [place] [address]    Special Requests   Final    BOTTLES DRAWN AEROBIC AND ANAEROBIC Blood Culture results may not be optimal due to an excessive volume of blood received in culture bottles Performed at [facility] [address] [place], [place] [address]    Culture   Final    NO GROWTH 2 DAYS Performed at [facility] [address], [place], [place] [address]    Report Status PENDING  Incomplete  Urine Culture     Status: None   Collection Time: 10/18/20  6:25 PM    Specimen: In/Out Cath Urine  Result Value Ref Range Status   Specimen Description   Final    IN/OUT CATH URINE Performed at [facility] [address] [place], [place] [address]    Special Requests   Final    NONE Performed at [facility], [place] [address], [place], [place] [address]    Culture   Final    NO GROWTH Performed at [facility], [place] [address], [place], [place] [address]    Report Status 10/20/2020 FINAL  Final  Resp Panel by RT-PCR (Flu A&B,  Covid) Nasopharyngeal Swab     Status: None   Collection Time: 10/18/20  6:36 PM   Specimen: Nasopharyngeal Swab; Nasopharyngeal(NP) swabs in vial transport medium  Result Value Ref Range Status   SARS Coronavirus 2 by RT PCR NEGATIVE NEGATIVE Final    Comment: (NOTE) SARS-CoV-2 target nucleic acids are NOT DETECTED.  The SARS-CoV-2 RNA is generally detectable in upper respiratory specimens during the acute phase of infection. The lowest concentration of SARS-CoV-2 viral copies this assay can detect is 138 copies/mL. A negative result does not preclude SARS-Cov-2 infection and should not be used as the sole basis for treatment or other patient management decisions. A negative result may occur with  improper specimen collection/handling, submission of specimen other than nasopharyngeal swab, presence of viral mutation(s) within the areas targeted by this assay, and inadequate number of viral copies(<138 copies/mL). A negative result must be combined with clinical observations, patient history, and epidemiological information. The expected result is Negative.  Fact Sheet for Patients:  EntrepreneurPulse.com.au  Fact Sheet for Healthcare Providers:  IncredibleEmployment.be  This test is no t yet approved or cleared by the [place] FDA and  has been authorized for detection and/or diagnosis of SARS-CoV-2 by FDA under an Emergency Use  Authorization (EUA). This EUA will remain  in effect (meaning this test can be used) for the duration of the COVID-19 declaration under Section 564(b)(1) of the Act, 21 U.S.C.section 360bbb-3(b)(1), unless the authorization is terminated  or revoked sooner.       Influenza A by PCR NEGATIVE NEGATIVE Final   Influenza B by PCR NEGATIVE NEGATIVE Final    Comment: (NOTE) The Xpert Xpress SARS-CoV-2/FLU/RSV plus assay is intended as an aid in the diagnosis of influenza from Nasopharyngeal swab specimens and should not be used as a sole basis for treatment. Nasal washings and aspirates are unacceptable for Xpert Xpress SARS-CoV-2/FLU/RSV testing.  Fact Sheet for Patients: EntrepreneurPulse.com.au  Fact Sheet for Healthcare Providers: IncredibleEmployment.be  This test is not yet approved or cleared by the [place] FDA and has been authorized for detection and/or diagnosis of SARS-CoV-2 by FDA under an Emergency Use Authorization (EUA). This EUA will remain in effect (meaning this test can be used) for the duration of the COVID-19 declaration under Section 564(b)(1) of the Act, 21 U.S.C. section 360bbb-3(b)(1), unless the authorization is terminated or revoked.  Performed at [facility], [place] [address] [place], [place] [address]   Resp panel by RT-PCR (RSV, Flu A&B, Covid)     Status: None   Collection Time: 10/18/20  9:30 PM  Result Value Ref Range Status   SARS Coronavirus 2 by RT PCR NEGATIVE NEGATIVE Final    Comment: (NOTE) SARS-CoV-2 target nucleic acids are NOT DETECTED.  The SARS-CoV-2 RNA is generally detectable in upper respiratory specimens during the acute phase of infection. The lowest concentration of SARS-CoV-2 viral copies this assay can detect is 138 copies/mL. A negative result does not preclude SARS-Cov-2 infection and should not be used as the sole basis for treatment or other patient management  decisions. A negative result may occur with  improper specimen collection/handling, submission of specimen other than nasopharyngeal swab, presence of viral mutation(s) within the areas targeted by this assay, and inadequate number of viral copies(<138 copies/mL). A negative result must be combined with clinical observations, patient history, and epidemiological information. The expected result is Negative.  Fact Sheet for Patients:  EntrepreneurPulse.com.au  Fact Sheet for Healthcare Providers:  IncredibleEmployment.be  This test is  no t yet approved or cleared by the [place] and  has been authorized for detection and/or diagnosis of SARS-CoV-2 by FDA under an Emergency Use Authorization (EUA). This EUA will remain  in effect (meaning this test can be used) for the duration of the COVID-19 declaration under Section 564(b)(1) of the Act, 21 U.S.C.section 360bbb-3(b)(1), unless the authorization is terminated  or revoked sooner.       Influenza A by PCR NEGATIVE NEGATIVE Final   Influenza B by PCR NEGATIVE NEGATIVE Final    Comment: (NOTE) The Xpert Xpress SARS-CoV-2/FLU/RSV plus assay is intended as an aid in the diagnosis of influenza from Nasopharyngeal swab specimens and should not be used as a sole basis for treatment. Nasal washings and aspirates are unacceptable for Xpert Xpress SARS-CoV-2/FLU/RSV testing.  Fact Sheet for Patients: EntrepreneurPulse.com.au  Fact Sheet for Healthcare Providers: IncredibleEmployment.be  This test is not yet approved or cleared by the [place] FDA and has been authorized for detection and/or diagnosis of SARS-CoV-2 by FDA under an Emergency Use Authorization (EUA). This EUA will remain in effect (meaning this test can be used) for the duration of the COVID-19 declaration under Section 564(b)(1) of the Act, 21 U.S.C. section 360bbb-3(b)(1), unless the  authorization is terminated or revoked.     Resp Syncytial Virus by PCR NEGATIVE NEGATIVE Final    Comment: (NOTE) Fact Sheet for Patients: EntrepreneurPulse.com.au  Fact Sheet for Healthcare Providers: IncredibleEmployment.be  This test is not yet approved or cleared by the [place] FDA and has been authorized for detection and/or diagnosis of SARS-CoV-2 by FDA under an Emergency Use Authorization (EUA). This EUA will remain in effect (meaning this test can be used) for the duration of the COVID-19 declaration under Section 564(b)(1) of the Act, 21 U.S.C. section 360bbb-3(b)(1), unless the authorization is terminated or revoked.  Performed at [facility], [place] [address] [place], [place] [address]   MRSA Next Gen by PCR, Nasal     Status: None   Collection Time: 10/19/20 12:28 AM   Specimen: Nasal Mucosa; Nasal Swab  Result Value Ref Range Status   MRSA by PCR Next Gen NOT DETECTED NOT DETECTED Final    Comment: (NOTE) The GeneXpert MRSA Assay (FDA approved for NASAL specimens only), is one component of a comprehensive MRSA colonization surveillance program. It is not intended to diagnose MRSA infection nor to guide or monitor treatment for MRSA infections. Test performance is not FDA approved in patients less than 25 years old. Performed at [facility], [place] [address], [place], [place] [address]   Culture, blood (routine x 2)     Status: None (Preliminary result)   Collection Time: 10/19/20 12:38 AM   Specimen: BLOOD  Result Value Ref Range Status   Specimen Description   Final    BLOOD BLOOD LEFT WRIST Performed at [facility] [address], [place], [place] [address]    Special Requests   Final    BOTTLES DRAWN AEROBIC ONLY Blood Culture adequate volume Performed at [facility] [address], [place], [place] [address]    Culture   Final     NO GROWTH 1 DAY Performed at [facility], [place] [address], [place], [place] [address]    Report Status PENDING  Incomplete  Culture, blood (routine x 2)     Status: None (Preliminary result)   Collection Time: 10/19/20 12:38 AM   Specimen: BLOOD  Result Value Ref  Range Status   Specimen Description   Final    BLOOD BLOOD RIGHT ARM Performed at [facility] [address], [place], [place] [address]    Special Requests   Final    BOTTLES DRAWN AEROBIC ONLY Blood Culture adequate volume Performed at [facility] [address] [place], [place] [address]    Culture   Final    NO GROWTH 1 DAY Performed at [facility], [place] [address] [place], [place] [address]    Report Status PENDING  Incomplete  C Difficile Quick Screen w PCR reflex     Status: Abnormal   Collection Time: 10/19/20  3:46 AM   Specimen: STOOL  Result Value Ref Range Status   C Diff antigen POSITIVE (A) NEGATIVE Final   C Diff toxin POSITIVE (A) NEGATIVE Final   C Diff interpretation Toxin producing C. difficile detected.  Final    Comment: CRITICAL RESULT CALLED TO, READ BACK BY AND VERIFIED WITH: TROI BARKSDALE RN.@[email or web address]  ON 9.21.22 BY TCALDWELL MT. Performed at [facility] [address] [place], [place] [address]      Labs: CBC: Recent Labs  Lab 10/15/20 [phone] 10/16/20 0347 10/17/20 0317 10/18/20 1537 10/18/20 2058 10/18/20 2229 10/19/20 0557 10/19/20 0947 10/19/20 1642 10/20/20 0248 10/20/20 1416 10/21/20 0249  WBC 14.0* 13.9* 13.8* 10.9* 9.2   < > 8.3 8.2 10.1 11.9*  --  9.7  NEUTROABS 12.0* 11.7* 11.6* 8.3* 6.5  --   --   --   --   --   --   --   HGB 11.1* 10.1* 9.9* 7.7* 6.3*   < > 7.3* 7.4* 7.1* 7.0* 7.7* 7.9*  HCT 30.7* 29.0* 28.1* 22.3* 18.7*   < > 20.8* 21.8* 20.6* 20.9* 22.5* 23.2*  MCV 91.9 92.7 93.0 95.7 96.4   < > 95.0 95.6 95.4 96.8  --  95.9  PLT 381 416* 447* 493* 406*   < > 450* 442* 507* 432*  --  446*   <  > = values in this interval not displayed.    Basic Metabolic Panel: Recent Labs  Lab 10/15/20 0828 10/16/20 0347 10/17/20 0317 10/17/20 1634 10/18/20 1537 10/18/20 2058 10/19/20 0038 10/21/20 0249  NA  --    < > 127* 130* 130*  --  134* 134*  K  --    < > 3.3* 3.6 3.5  --  3.7 4.3  CL  --    < > 88* 90* 92*  --  98 99  CO2  --    < > 29 28 27   --  30 27  GLUCOSE  --    < > 120* 132* 188*  --  149* 113*  BUN  --    < > 18 20 30*  --  19 14  CREATININE  --    < > 0.91 1.03 1.05  --  0.74 0.68  CALCIUM  --    < > 8.4* 8.3* 7.5*  --  8.0* 8.2*  MG 2.0  --   --   --   --  1.9 2.2  --   PHOS  --   --   --   --   --  3.5  --   --    < > = values in this interval not displayed.    Liver Function Tests: Recent Labs  Lab 10/18/20 1537  AST 29  ALT 20  ALKPHOS 172*  BILITOT 0.9  PROT 5.5*  ALBUMIN 2.2*     Hgb A1c Recent Labs    10/19/20 0038  HGBA1C 6.4*   Anemia work up Recent Labs    10/18/20 2058 10/18/20 2123  VITAMINB12  --  990*  FOLATE  --  9.7  FERRITIN  --  958*  TIBC  --  172*  IRON  --  57  RETICCTPCT 2.7  --     Urinalysis    Component Value Date/Time   COLORURINE YELLOW 10/18/2020 1825   APPEARANCEUR CLEAR 10/18/2020 1825   LABSPEC <1.005 (L) 10/18/2020 1825   PHURINE 6.0 10/18/2020 1825   GLUCOSEU NEGATIVE 10/18/2020 1825   HGBUR NEGATIVE 10/18/2020 1825   BILIRUBINUR NEGATIVE 10/18/2020 1825   KETONESUR NEGATIVE 10/18/2020 1825   PROTEINUR NEGATIVE 10/18/2020 1825   NITRITE NEGATIVE 10/18/2020 1825   LEUKOCYTESUR NEGATIVE 10/18/2020 1825      Time coordinating discharge: 25 minutes  SIGNED:  Vernell Leep, MD, [place], [facility]. [facility]  To contact the attending provider between 7A-7P or the covering provider during after hours 7P-7A, please log into the web site [email or web address] and access using universal [place] password for that web site. If you do not have the password, please call the hospital operator.

## 2020-10-21 NOTE — Telephone Encounter (Signed)
Scheduled appt per 9/23 staff msg from Dr. Lorenso Courier. Pt is aware of appt date and time.

## 2020-10-21 NOTE — Discharge Instructions (Signed)

## 2020-10-21 NOTE — Progress Notes (Addendum)
Patient education completed prior to discharge, PIV removed and escorted by wheelchair to family member's car safely. Patient understood all discharge instructions regarding medication changes and follow up appointments. Patient stable at time of discharge.

## 2020-10-22 LAB — TYPE AND SCREEN
ABO/RH(D): A POS
Antibody Screen: POSITIVE
DAT, IgG: NEGATIVE
Donor AG Type: NEGATIVE
Donor AG Type: NEGATIVE
Unit division: 0
Unit division: 0

## 2020-10-22 LAB — BPAM RBC
Blood Product Expiration Date: 202210132359
Blood Product Expiration Date: 202210132359
ISSUE DATE / TIME: 202209220933
Unit Type and Rh: 5100
Unit Type and Rh: 5100

## 2020-10-23 LAB — CULTURE, BLOOD (ROUTINE X 2)
Culture: NO GROWTH
Culture: NO GROWTH
Special Requests: ADEQUATE

## 2020-10-24 LAB — CULTURE, BLOOD (ROUTINE X 2)
Culture: NO GROWTH
Culture: NO GROWTH
Special Requests: ADEQUATE
Special Requests: ADEQUATE

## 2020-10-28 DIAGNOSIS — D509 Iron deficiency anemia, unspecified: Secondary | ICD-10-CM | POA: Diagnosis not present

## 2020-10-28 DIAGNOSIS — R739 Hyperglycemia, unspecified: Secondary | ICD-10-CM | POA: Diagnosis not present

## 2020-10-28 DIAGNOSIS — D649 Anemia, unspecified: Secondary | ICD-10-CM | POA: Diagnosis not present

## 2020-10-28 DIAGNOSIS — K5100 Ulcerative (chronic) pancolitis without complications: Secondary | ICD-10-CM | POA: Diagnosis not present

## 2020-10-28 DIAGNOSIS — M459 Ankylosing spondylitis of unspecified sites in spine: Secondary | ICD-10-CM | POA: Diagnosis not present

## 2020-11-01 ENCOUNTER — Inpatient Hospital Stay: Payer: BC Managed Care – PPO

## 2020-11-01 ENCOUNTER — Inpatient Hospital Stay: Payer: BC Managed Care – PPO | Attending: Physician Assistant | Admitting: Physician Assistant

## 2020-11-01 DIAGNOSIS — U071 COVID-19: Secondary | ICD-10-CM | POA: Diagnosis not present

## 2020-11-11 DIAGNOSIS — U071 COVID-19: Secondary | ICD-10-CM | POA: Diagnosis not present

## 2020-11-14 DIAGNOSIS — D509 Iron deficiency anemia, unspecified: Secondary | ICD-10-CM | POA: Diagnosis not present

## 2020-11-14 DIAGNOSIS — I10 Essential (primary) hypertension: Secondary | ICD-10-CM | POA: Diagnosis not present

## 2020-11-14 DIAGNOSIS — R000 Tachycardia, unspecified: Secondary | ICD-10-CM | POA: Diagnosis not present

## 2020-11-14 DIAGNOSIS — M459 Ankylosing spondylitis of unspecified sites in spine: Secondary | ICD-10-CM | POA: Diagnosis not present

## 2020-11-14 DIAGNOSIS — D649 Anemia, unspecified: Secondary | ICD-10-CM | POA: Diagnosis not present

## 2020-11-14 DIAGNOSIS — K5100 Ulcerative (chronic) pancolitis without complications: Secondary | ICD-10-CM | POA: Diagnosis not present

## 2020-11-14 DIAGNOSIS — R739 Hyperglycemia, unspecified: Secondary | ICD-10-CM | POA: Diagnosis not present

## 2020-12-02 DIAGNOSIS — U071 COVID-19: Secondary | ICD-10-CM | POA: Diagnosis not present

## 2020-12-07 DIAGNOSIS — Z111 Encounter for screening for respiratory tuberculosis: Secondary | ICD-10-CM | POA: Diagnosis not present

## 2020-12-07 DIAGNOSIS — M1009 Idiopathic gout, multiple sites: Secondary | ICD-10-CM | POA: Diagnosis not present

## 2020-12-07 DIAGNOSIS — M459 Ankylosing spondylitis of unspecified sites in spine: Secondary | ICD-10-CM | POA: Diagnosis not present

## 2020-12-07 DIAGNOSIS — M2550 Pain in unspecified joint: Secondary | ICD-10-CM | POA: Diagnosis not present

## 2020-12-07 DIAGNOSIS — K5190 Ulcerative colitis, unspecified, without complications: Secondary | ICD-10-CM | POA: Diagnosis not present

## 2020-12-12 DIAGNOSIS — U071 COVID-19: Secondary | ICD-10-CM | POA: Diagnosis not present

## 2020-12-15 DIAGNOSIS — I872 Venous insufficiency (chronic) (peripheral): Secondary | ICD-10-CM | POA: Diagnosis not present

## 2020-12-15 DIAGNOSIS — Z23 Encounter for immunization: Secondary | ICD-10-CM | POA: Diagnosis not present

## 2020-12-15 DIAGNOSIS — D509 Iron deficiency anemia, unspecified: Secondary | ICD-10-CM | POA: Diagnosis not present

## 2020-12-15 DIAGNOSIS — I10 Essential (primary) hypertension: Secondary | ICD-10-CM | POA: Diagnosis not present

## 2020-12-15 DIAGNOSIS — K5100 Ulcerative (chronic) pancolitis without complications: Secondary | ICD-10-CM | POA: Diagnosis not present

## 2021-01-01 DIAGNOSIS — U071 COVID-19: Secondary | ICD-10-CM | POA: Diagnosis not present

## 2021-01-04 ENCOUNTER — Ambulatory Visit
Admission: RE | Admit: 2021-01-04 | Discharge: 2021-01-04 | Disposition: A | Discharge status: Home / Self Care | Payer: BC Managed Care – PPO | Source: Ambulatory Visit | Attending: Physician Assistant | Admitting: Physician Assistant

## 2021-01-04 ENCOUNTER — Other Ambulatory Visit: Payer: Self-pay | Admitting: Physician Assistant

## 2021-01-04 DIAGNOSIS — M545 Low back pain, unspecified: Secondary | ICD-10-CM

## 2021-01-04 DIAGNOSIS — M546 Pain in thoracic spine: Secondary | ICD-10-CM

## 2021-01-04 DIAGNOSIS — W19XXXA Unspecified fall, initial encounter: Secondary | ICD-10-CM

## 2021-01-04 DIAGNOSIS — Z043 Encounter for examination and observation following other accident: Secondary | ICD-10-CM | POA: Diagnosis not present

## 2021-01-04 DIAGNOSIS — Z981 Arthrodesis status: Secondary | ICD-10-CM | POA: Diagnosis not present

## 2021-01-04 DIAGNOSIS — W108XXA Fall (on) (from) other stairs and steps, initial encounter: Secondary | ICD-10-CM | POA: Diagnosis not present

## 2021-01-04 IMAGING — DX DG CHEST 1V PORT
1 series · 1 of 1 positions shown · non-contrast
Comparison: 08/04/2010

CLINICAL DATA: Shortness of breath, fatigue

EXAM:
PORTABLE CHEST 1 VIEW

[chest ap]
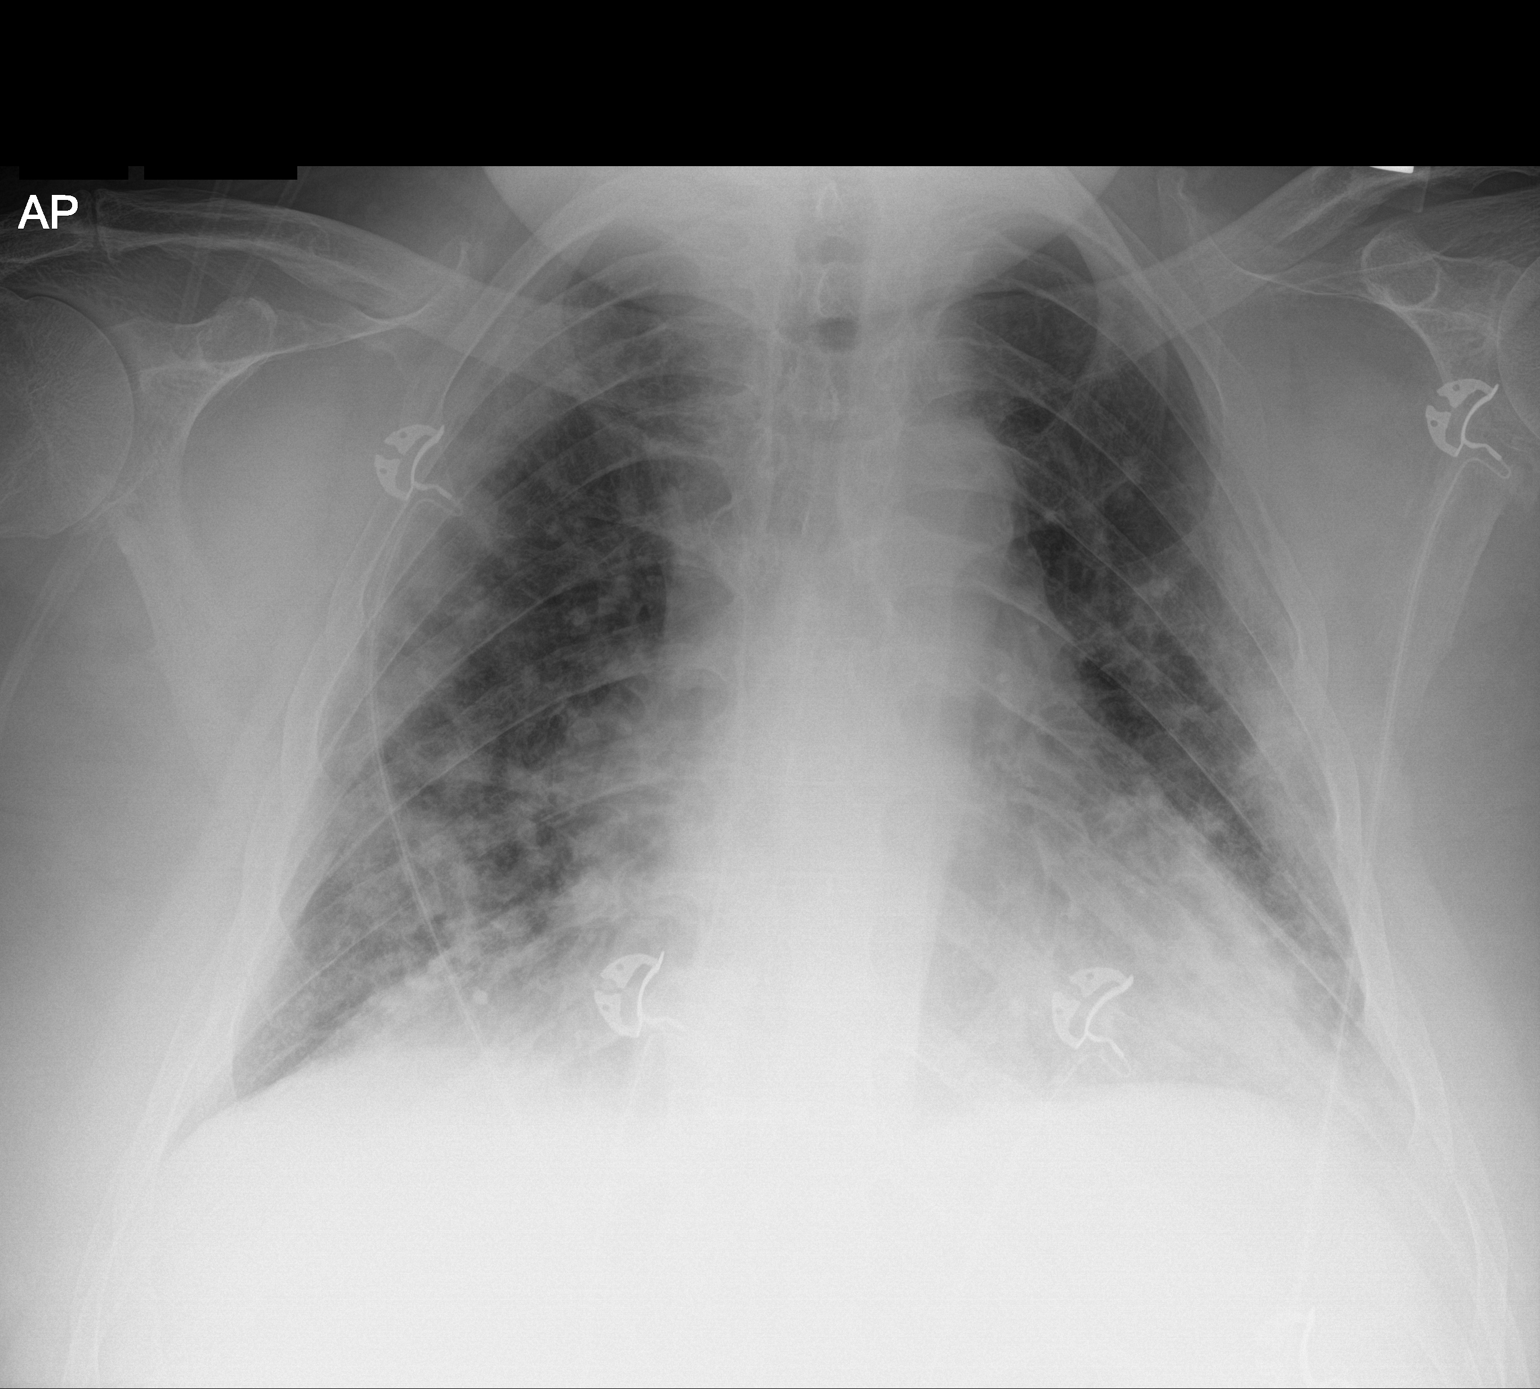

[1 of 1 positions shown; findings below may reference images not displayed]

FINDINGS: Heart size is mildly enlarged. There are patchy airspace opacities
most pronounced within the bilateral lung bases and periphery of the
mid lung zones. No pleural effusion or pneumothorax.
IMPRESSION: 1. Patchy bilateral airspace opacities favored to represent
multifocal atypical/viral pneumonia.
2. Mild cardiomegaly.

## 2021-01-11 DIAGNOSIS — U071 COVID-19: Secondary | ICD-10-CM | POA: Diagnosis not present

## 2021-01-12 DIAGNOSIS — M9902 Segmental and somatic dysfunction of thoracic region: Secondary | ICD-10-CM | POA: Diagnosis not present

## 2021-01-12 DIAGNOSIS — M62838 Other muscle spasm: Secondary | ICD-10-CM | POA: Diagnosis not present

## 2021-01-12 DIAGNOSIS — M9905 Segmental and somatic dysfunction of pelvic region: Secondary | ICD-10-CM | POA: Diagnosis not present

## 2021-01-12 DIAGNOSIS — M9903 Segmental and somatic dysfunction of lumbar region: Secondary | ICD-10-CM | POA: Diagnosis not present

## 2021-01-13 DIAGNOSIS — M9902 Segmental and somatic dysfunction of thoracic region: Secondary | ICD-10-CM | POA: Diagnosis not present

## 2021-01-13 DIAGNOSIS — M4854XA Collapsed vertebra, not elsewhere classified, thoracic region, initial encounter for fracture: Secondary | ICD-10-CM | POA: Diagnosis not present

## 2021-01-13 DIAGNOSIS — M9903 Segmental and somatic dysfunction of lumbar region: Secondary | ICD-10-CM | POA: Diagnosis not present

## 2021-01-13 DIAGNOSIS — M9905 Segmental and somatic dysfunction of pelvic region: Secondary | ICD-10-CM | POA: Diagnosis not present

## 2021-01-13 DIAGNOSIS — M62838 Other muscle spasm: Secondary | ICD-10-CM | POA: Diagnosis not present

## 2021-01-17 DIAGNOSIS — M9902 Segmental and somatic dysfunction of thoracic region: Secondary | ICD-10-CM | POA: Diagnosis not present

## 2021-01-17 DIAGNOSIS — M9903 Segmental and somatic dysfunction of lumbar region: Secondary | ICD-10-CM | POA: Diagnosis not present

## 2021-01-17 DIAGNOSIS — M62838 Other muscle spasm: Secondary | ICD-10-CM | POA: Diagnosis not present

## 2021-01-17 DIAGNOSIS — M9905 Segmental and somatic dysfunction of pelvic region: Secondary | ICD-10-CM | POA: Diagnosis not present

## 2021-01-19 DIAGNOSIS — M9905 Segmental and somatic dysfunction of pelvic region: Secondary | ICD-10-CM | POA: Diagnosis not present

## 2021-01-19 DIAGNOSIS — M9903 Segmental and somatic dysfunction of lumbar region: Secondary | ICD-10-CM | POA: Diagnosis not present

## 2021-01-19 DIAGNOSIS — M9902 Segmental and somatic dysfunction of thoracic region: Secondary | ICD-10-CM | POA: Diagnosis not present

## 2021-01-19 DIAGNOSIS — M62838 Other muscle spasm: Secondary | ICD-10-CM | POA: Diagnosis not present

## 2021-01-20 DIAGNOSIS — M62838 Other muscle spasm: Secondary | ICD-10-CM | POA: Diagnosis not present

## 2021-01-20 DIAGNOSIS — M9903 Segmental and somatic dysfunction of lumbar region: Secondary | ICD-10-CM | POA: Diagnosis not present

## 2021-01-20 DIAGNOSIS — M9905 Segmental and somatic dysfunction of pelvic region: Secondary | ICD-10-CM | POA: Diagnosis not present

## 2021-01-20 DIAGNOSIS — M9902 Segmental and somatic dysfunction of thoracic region: Secondary | ICD-10-CM | POA: Diagnosis not present

## 2021-01-24 DIAGNOSIS — M62838 Other muscle spasm: Secondary | ICD-10-CM | POA: Diagnosis not present

## 2021-01-24 DIAGNOSIS — M9903 Segmental and somatic dysfunction of lumbar region: Secondary | ICD-10-CM | POA: Diagnosis not present

## 2021-01-24 DIAGNOSIS — M9902 Segmental and somatic dysfunction of thoracic region: Secondary | ICD-10-CM | POA: Diagnosis not present

## 2021-01-24 DIAGNOSIS — M9905 Segmental and somatic dysfunction of pelvic region: Secondary | ICD-10-CM | POA: Diagnosis not present

## 2021-01-25 DIAGNOSIS — M9902 Segmental and somatic dysfunction of thoracic region: Secondary | ICD-10-CM | POA: Diagnosis not present

## 2021-01-25 DIAGNOSIS — M62838 Other muscle spasm: Secondary | ICD-10-CM | POA: Diagnosis not present

## 2021-01-25 DIAGNOSIS — M9903 Segmental and somatic dysfunction of lumbar region: Secondary | ICD-10-CM | POA: Diagnosis not present

## 2021-01-25 DIAGNOSIS — M9905 Segmental and somatic dysfunction of pelvic region: Secondary | ICD-10-CM | POA: Diagnosis not present

## 2021-02-01 DIAGNOSIS — U071 COVID-19: Secondary | ICD-10-CM | POA: Diagnosis not present

## 2021-02-03 DIAGNOSIS — M62838 Other muscle spasm: Secondary | ICD-10-CM | POA: Diagnosis not present

## 2021-02-03 DIAGNOSIS — M9902 Segmental and somatic dysfunction of thoracic region: Secondary | ICD-10-CM | POA: Diagnosis not present

## 2021-02-03 DIAGNOSIS — M9903 Segmental and somatic dysfunction of lumbar region: Secondary | ICD-10-CM | POA: Diagnosis not present

## 2021-02-03 DIAGNOSIS — M9905 Segmental and somatic dysfunction of pelvic region: Secondary | ICD-10-CM | POA: Diagnosis not present

## 2021-02-11 DIAGNOSIS — U071 COVID-19: Secondary | ICD-10-CM | POA: Diagnosis not present

## 2021-02-13 DIAGNOSIS — M5451 Vertebrogenic low back pain: Secondary | ICD-10-CM | POA: Diagnosis not present

## 2021-02-13 DIAGNOSIS — M9902 Segmental and somatic dysfunction of thoracic region: Secondary | ICD-10-CM | POA: Diagnosis not present

## 2021-02-13 DIAGNOSIS — M9905 Segmental and somatic dysfunction of pelvic region: Secondary | ICD-10-CM | POA: Diagnosis not present

## 2021-02-13 DIAGNOSIS — M9903 Segmental and somatic dysfunction of lumbar region: Secondary | ICD-10-CM | POA: Diagnosis not present

## 2021-02-16 DIAGNOSIS — M9902 Segmental and somatic dysfunction of thoracic region: Secondary | ICD-10-CM | POA: Diagnosis not present

## 2021-02-16 DIAGNOSIS — M5451 Vertebrogenic low back pain: Secondary | ICD-10-CM | POA: Diagnosis not present

## 2021-02-16 DIAGNOSIS — M9903 Segmental and somatic dysfunction of lumbar region: Secondary | ICD-10-CM | POA: Diagnosis not present

## 2021-02-16 DIAGNOSIS — M62838 Other muscle spasm: Secondary | ICD-10-CM | POA: Diagnosis not present

## 2021-02-16 DIAGNOSIS — M9905 Segmental and somatic dysfunction of pelvic region: Secondary | ICD-10-CM | POA: Diagnosis not present

## 2021-02-21 DIAGNOSIS — M454 Ankylosing spondylitis of thoracic region: Secondary | ICD-10-CM | POA: Diagnosis not present

## 2021-02-21 DIAGNOSIS — M5384 Other specified dorsopathies, thoracic region: Secondary | ICD-10-CM | POA: Diagnosis not present

## 2021-02-21 DIAGNOSIS — M62838 Other muscle spasm: Secondary | ICD-10-CM | POA: Diagnosis not present

## 2021-02-21 DIAGNOSIS — M9902 Segmental and somatic dysfunction of thoracic region: Secondary | ICD-10-CM | POA: Diagnosis not present

## 2021-02-21 DIAGNOSIS — M9903 Segmental and somatic dysfunction of lumbar region: Secondary | ICD-10-CM | POA: Diagnosis not present

## 2021-02-21 DIAGNOSIS — M9905 Segmental and somatic dysfunction of pelvic region: Secondary | ICD-10-CM | POA: Diagnosis not present

## 2021-03-01 DIAGNOSIS — D509 Iron deficiency anemia, unspecified: Secondary | ICD-10-CM | POA: Diagnosis not present

## 2021-03-01 DIAGNOSIS — R739 Hyperglycemia, unspecified: Secondary | ICD-10-CM | POA: Diagnosis not present

## 2021-03-01 DIAGNOSIS — R7309 Other abnormal glucose: Secondary | ICD-10-CM | POA: Diagnosis not present

## 2021-03-01 DIAGNOSIS — D649 Anemia, unspecified: Secondary | ICD-10-CM | POA: Diagnosis not present

## 2021-03-01 DIAGNOSIS — Z0001 Encounter for general adult medical examination with abnormal findings: Secondary | ICD-10-CM | POA: Diagnosis not present

## 2021-03-01 DIAGNOSIS — Z125 Encounter for screening for malignant neoplasm of prostate: Secondary | ICD-10-CM | POA: Diagnosis not present

## 2021-03-01 DIAGNOSIS — I872 Venous insufficiency (chronic) (peripheral): Secondary | ICD-10-CM | POA: Diagnosis not present

## 2021-03-01 DIAGNOSIS — I10 Essential (primary) hypertension: Secondary | ICD-10-CM | POA: Diagnosis not present

## 2021-03-01 DIAGNOSIS — Z13220 Encounter for screening for lipoid disorders: Secondary | ICD-10-CM | POA: Diagnosis not present

## 2021-03-04 DIAGNOSIS — U071 COVID-19: Secondary | ICD-10-CM | POA: Diagnosis not present

## 2021-03-14 DIAGNOSIS — I10 Essential (primary) hypertension: Secondary | ICD-10-CM | POA: Diagnosis not present

## 2021-03-14 DIAGNOSIS — Z6832 Body mass index (BMI) 32.0-32.9, adult: Secondary | ICD-10-CM | POA: Diagnosis not present

## 2021-03-14 DIAGNOSIS — S22070A Wedge compression fracture of T9-T10 vertebra, initial encounter for closed fracture: Secondary | ICD-10-CM | POA: Diagnosis not present

## 2021-03-14 DIAGNOSIS — U071 COVID-19: Secondary | ICD-10-CM | POA: Diagnosis not present

## 2021-03-15 ENCOUNTER — Other Ambulatory Visit: Payer: Self-pay | Admitting: Student

## 2021-03-15 DIAGNOSIS — S22070A Wedge compression fracture of T9-T10 vertebra, initial encounter for closed fracture: Secondary | ICD-10-CM

## 2021-03-22 ENCOUNTER — Ambulatory Visit
Admission: RE | Admit: 2021-03-22 | Discharge: 2021-03-22 | Disposition: A | Discharge status: Home / Self Care | Payer: BC Managed Care – PPO | Source: Ambulatory Visit | Attending: Student | Admitting: Student

## 2021-03-22 DIAGNOSIS — M549 Dorsalgia, unspecified: Secondary | ICD-10-CM | POA: Diagnosis not present

## 2021-03-22 DIAGNOSIS — S22070A Wedge compression fracture of T9-T10 vertebra, initial encounter for closed fracture: Secondary | ICD-10-CM

## 2021-03-23 DIAGNOSIS — I10 Essential (primary) hypertension: Secondary | ICD-10-CM | POA: Diagnosis not present

## 2021-03-23 DIAGNOSIS — Z6833 Body mass index (BMI) 33.0-33.9, adult: Secondary | ICD-10-CM | POA: Diagnosis not present

## 2021-03-23 DIAGNOSIS — S22078K Other fracture of T9-T10 vertebra, subsequent encounter for fracture with nonunion: Secondary | ICD-10-CM | POA: Diagnosis not present

## 2021-03-24 ENCOUNTER — Other Ambulatory Visit: Payer: Self-pay | Admitting: Neurological Surgery

## 2021-03-24 DIAGNOSIS — S22078K Other fracture of T9-T10 vertebra, subsequent encounter for fracture with nonunion: Secondary | ICD-10-CM | POA: Diagnosis not present

## 2021-03-29 NOTE — Progress Notes (Signed)
Surgical Instructions ? ? ? Your procedure is scheduled on Friday,March 3. ? Report to [facility] Main Entrance "A" at 8:35 A.M., then check in with the Admitting office. ? Call this number if you have problems the morning of surgery: ? [phone] ? ? If you have any questions prior to your surgery date call [phone]: Open Monday-Friday 8am-4pm ? ? ? Remember: ? Do not eat after midnight the night before your surgery ? ?You may drink clear liquids until 7:30am the morning of your surgery.   ?Clear liquids allowed are: Water, Non-Citrus Juices (without pulp), Carbonated Beverages, Clear Tea, Black Coffee ONLY (NO MILK, CREAM OR POWDERED CREAMER of any kind), and Gatorade ?  ? Take these medicines the morning of surgery with A SIP OF WATER:  ?allopurinol (ZYLOPRIM)  ?metoprolol succinate (TOPROL-XL) ? ?If needed,you may take : ?colchicine  ?oxyCODONE-acetaminophen (PERCOCET) ? ? ?As of today, STOP taking any Aspirin (unless otherwise instructed by your surgeon) celecoxib (CELEBREX), Aleve, Naproxen, Ibuprofen, Motrin, Advil, Goody's, BC's, all herbal medications, fish oil, and all vitamins. ? ?         ?Do not wear jewelry or makeup ?Do not wear lotions, powders, perfumes/colognes, or deodorant. ?Do not shave 48 hours prior to surgery.  Men may shave face and neck. ?Do not bring valuables to the hospital. ?Do not wear nail polish, gel polish, artificial nails, or any other type of covering on natural nails (fingers and toes) ?If you have artificial nails or gel coating that need to be removed by a nail salon, please have this removed prior to surgery. Artificial nails or gel coating may interfere with anesthesia's ability to adequately monitor your vital signs. ? ?[facility] is not responsible for any belongings or valuables. .  ? ?Do NOT Smoke (Tobacco/Vaping)  24 hours prior to your procedure ? ?If you use a CPAP at night, you may bring your mask for your overnight stay. ?  ?Contacts, glasses, hearing aids,  dentures or partials may not be worn into surgery, please bring cases for these belongings ?  ?For patients admitted to the hospital, discharge time will be determined by your treatment team. ?  ?Patients discharged the day of surgery will not be allowed to drive home, and someone needs to stay with them for 24 hours. ? ?NO VISITORS WILL BE ALLOWED IN PRE-OP WHERE PATIENTS ARE PREPPED FOR SURGERY.  ONLY 1 SUPPORT PERSON MAY BE PRESENT IN THE WAITING ROOM WHILE YOU ARE IN SURGERY.  IF YOU ARE TO BE ADMITTED, ONCE YOU ARE IN YOUR ROOM YOU WILL BE ALLOWED TWO (2) VISITORS. 1 (ONE) VISITOR MAY STAY OVERNIGHT BUT MUST ARRIVE TO THE ROOM BY 8pm.  Minor children may have two parents present. Special consideration for safety and communication needs will be reviewed on a case by case basis. ? ?Special instructions:   ? ?Oral Hygiene is also important to reduce your risk of infection.  Remember - BRUSH YOUR TEETH THE MORNING OF SURGERY WITH YOUR REGULAR TOOTHPASTE ? ? ?[facility]- Preparing For Surgery ? ?Before surgery, you can play an important role. Because skin is not sterile, your skin needs to be as free of germs as possible. You can reduce the number of germs on your skin by washing with CHG (chlorahexidine gluconate) Soap before surgery.  CHG is an antiseptic cleaner which kills germs and bonds with the skin to continue killing germs even after washing.   ? ? ?Please do not use if you have an allergy  to CHG or antibacterial soaps. If your skin becomes reddened/irritated stop using the CHG.  ?Do not shave (including legs and underarms) for at least 48 hours prior to first CHG shower. It is OK to shave your face. ? ?Please follow these instructions carefully. ?  ? ? Shower the NIGHT BEFORE SURGERY and the MORNING OF SURGERY with CHG Soap.  ? If you chose to wash your hair, wash your hair first as usual with your normal shampoo. After you shampoo, rinse your hair and body thoroughly to remove the shampoo.  Then [employer] and genitals (private parts) with your normal soap and rinse thoroughly to remove soap. ? ?After that Use CHG Soap as you would any other liquid soap. You can apply CHG directly to the skin and wash gently with a scrungie or a clean washcloth.  ? ?Apply the CHG Soap to your body ONLY FROM THE NECK DOWN.  Do not use on open wounds or open sores. Avoid contact with your eyes, ears, mouth and genitals (private parts). Wash Face and genitals (private parts)  with your normal soap.  ? ?Wash thoroughly, paying special attention to the area where your surgery will be performed. ? ?Thoroughly rinse your body with warm water from the neck down. ? ?DO NOT shower/wash with your normal soap after using and rinsing off the CHG Soap. ? ?Pat yourself dry with a CLEAN TOWEL. ? ?Wear CLEAN PAJAMAS to bed the night before surgery ? ?Place CLEAN SHEETS on your bed the night before your surgery ? ?DO NOT SLEEP WITH PETS. ? ? ?Day of Surgery: ? ?Take a shower with CHG soap. ?Wear Clean/Comfortable clothing the morning of surgery ?Do not apply any deodorants/lotions.   ?Remember to brush your teeth WITH YOUR REGULAR TOOTHPASTE. ? ? ? ?COVID testing ? ?If you are going to stay overnight or be admitted after your procedure/surgery and require a pre-op COVID test, please follow these instructions after your COVID test  ? ?You are not required to quarantine however you are required to wear a well-fitting mask when you are out and around people not in your household.  If your mask becomes wet or soiled, replace with a new one. ? ?Wash your hands often with soap and water for 20 seconds or clean your hands with an alcohol-based hand sanitizer that contains at least 60% alcohol. ? ?Do not share personal items. ? ?Notify your provider: ?if you are in close contact with someone who has COVID  ?or if you develop a fever of 100.4 or greater, sneezing, cough, sore throat, shortness of breath or body aches. ? ?  ?Please read over the following  fact sheets that you were given.   ?

## 2021-03-30 ENCOUNTER — Other Ambulatory Visit: Payer: Self-pay

## 2021-03-30 ENCOUNTER — Encounter (HOSPITAL_COMMUNITY)
Admission: RE | Admit: 2021-03-30 | Discharge: 2021-03-30 | Disposition: A | Discharge status: Home / Self Care | Payer: BC Managed Care – PPO | Source: Ambulatory Visit | Attending: Neurological Surgery | Admitting: Neurological Surgery

## 2021-03-30 ENCOUNTER — Encounter (HOSPITAL_COMMUNITY): Payer: Self-pay

## 2021-03-30 VITALS — BP 124/89 | HR 67 | Temp 98.7°F | Resp 18 | Ht 68.0 in | Wt 213.0 lb

## 2021-03-30 DIAGNOSIS — Z9049 Acquired absence of other specified parts of digestive tract: Secondary | ICD-10-CM | POA: Diagnosis not present

## 2021-03-30 DIAGNOSIS — Z882 Allergy status to sulfonamides status: Secondary | ICD-10-CM | POA: Diagnosis not present

## 2021-03-30 DIAGNOSIS — U071 COVID-19: Secondary | ICD-10-CM | POA: Diagnosis not present

## 2021-03-30 DIAGNOSIS — I10 Essential (primary) hypertension: Secondary | ICD-10-CM | POA: Diagnosis not present

## 2021-03-30 DIAGNOSIS — Z888 Allergy status to other drugs, medicaments and biological substances status: Secondary | ICD-10-CM | POA: Diagnosis not present

## 2021-03-30 DIAGNOSIS — Z8249 Family history of ischemic heart disease and other diseases of the circulatory system: Secondary | ICD-10-CM | POA: Diagnosis not present

## 2021-03-30 DIAGNOSIS — Z01812 Encounter for preprocedural laboratory examination: Secondary | ICD-10-CM | POA: Insufficient documentation

## 2021-03-30 DIAGNOSIS — Z01818 Encounter for other preprocedural examination: Secondary | ICD-10-CM

## 2021-03-30 DIAGNOSIS — W19XXXA Unspecified fall, initial encounter: Secondary | ICD-10-CM | POA: Diagnosis not present

## 2021-03-30 DIAGNOSIS — Z794 Long term (current) use of insulin: Secondary | ICD-10-CM | POA: Diagnosis not present

## 2021-03-30 DIAGNOSIS — S22079A Unspecified fracture of T9-T10 vertebra, initial encounter for closed fracture: Secondary | ICD-10-CM | POA: Diagnosis not present

## 2021-03-30 DIAGNOSIS — Z79899 Other long term (current) drug therapy: Secondary | ICD-10-CM | POA: Diagnosis not present

## 2021-03-30 DIAGNOSIS — S22009A Unspecified fracture of unspecified thoracic vertebra, initial encounter for closed fracture: Secondary | ICD-10-CM | POA: Diagnosis not present

## 2021-03-30 DIAGNOSIS — Z20822 Contact with and (suspected) exposure to covid-19: Secondary | ICD-10-CM | POA: Diagnosis not present

## 2021-03-30 DIAGNOSIS — S22078A Other fracture of T9-T10 vertebra, initial encounter for closed fracture: Secondary | ICD-10-CM | POA: Diagnosis not present

## 2021-03-30 DIAGNOSIS — M454 Ankylosing spondylitis of thoracic region: Secondary | ICD-10-CM | POA: Diagnosis not present

## 2021-03-30 DIAGNOSIS — Z981 Arthrodesis status: Secondary | ICD-10-CM | POA: Diagnosis not present

## 2021-03-30 LAB — BASIC METABOLIC PANEL
Anion gap: 13 (ref 5–15)
BUN: 6 mg/dL (ref 6–20)
CO2: 22 mmol/L (ref 22–32)
Calcium: 9.3 mg/dL (ref 8.9–10.3)
Chloride: 97 mmol/L — ABNORMAL LOW (ref 98–111)
Creatinine, Ser: 0.86 mg/dL (ref 0.61–1.24)
GFR, Estimated: >60 mL/min (ref >60)
Glucose, Bld: 125 mg/dL — ABNORMAL HIGH (ref 70–99)
Potassium: 3.6 mmol/L (ref 3.5–5.1)
Sodium: 132 mmol/L — ABNORMAL LOW (ref 135–145)

## 2021-03-30 LAB — CBC
HCT: 42.1 % (ref 39.0–52.0)
Hemoglobin: 14.7 g/dL (ref 13.0–17.0)
MCH: 31.5 pg (ref 26.0–34.0)
MCHC: 34.9 g/dL (ref 30.0–36.0)
MCV: 90.3 fL (ref 80.0–100.0)
Platelets: 362 10*3/uL (ref 150–400)
RBC: 4.66 MIL/uL (ref 4.22–5.81)
RDW: 15.1 % (ref 11.5–15.5)
WBC: 7.2 10*3/uL (ref 4.0–10.5)
nRBC: 0 % (ref 0.0–0.2)

## 2021-03-30 LAB — SURGICAL PCR SCREEN
MRSA, PCR: NEGATIVE
Staphylococcus aureus: POSITIVE — AB

## 2021-03-30 LAB — SARS CORONAVIRUS 2 BY RT PCR (HOSPITAL ORDER, PERFORMED IN ~~LOC~~ HOSPITAL LAB): SARS Coronavirus 2: NEGATIVE

## 2021-03-30 NOTE — Progress Notes (Signed)
PCP - Dr. Josetta Huddle ?Cardiologist - denies ? ?PPM/ICD - denies ? ? ?Chest x-ray - 10/18/20 ?EKG - 10/19/20 ?Stress Test - 25 years ago per pt, normal ?ECHO - 10/10/20 ?Cardiac Cath - denies ? ?Sleep Study - denies ? ? ?DM- denies ? ?Blood Thinner Instructions: Humira held since 2/10 per Dr. Ronnald Ramp orders ?Aspirin Instructions: n/a ? ?ERAS Protcol - yes, no drink ? ? ?COVID TEST- 03/30/21 at PAT ? ? ?Anesthesia review: no ? ?Patient denies shortness of breath, fever, cough and chest pain at PAT appointment ? ? ?All instructions explained to the patient, with a verbal understanding of the material. Patient agrees to go over the instructions while at home for a better understanding. Patient also instructed to wear a mask in public after being tested for COVID-19. The opportunity to ask questions was provided. ?  ?

## 2021-03-31 ENCOUNTER — Encounter (HOSPITAL_COMMUNITY)
Admission: RE | Disposition: A | Discharge status: Home / Self Care | Payer: Self-pay | Source: Home / Self Care | Attending: Neurological Surgery

## 2021-03-31 ENCOUNTER — Inpatient Hospital Stay (HOSPITAL_COMMUNITY)
Admission: RE | Admit: 2021-03-31 | Discharge: 2021-04-01 | DRG: 460 | Disposition: A | Discharge status: Home / Self Care | Payer: BC Managed Care – PPO | Attending: Neurological Surgery | Admitting: Neurological Surgery

## 2021-03-31 ENCOUNTER — Inpatient Hospital Stay (HOSPITAL_COMMUNITY): Payer: BC Managed Care – PPO | Admitting: Certified Registered Nurse Anesthetist

## 2021-03-31 ENCOUNTER — Encounter (HOSPITAL_COMMUNITY): Payer: Self-pay | Admitting: Neurological Surgery

## 2021-03-31 ENCOUNTER — Inpatient Hospital Stay (HOSPITAL_COMMUNITY): Payer: BC Managed Care – PPO

## 2021-03-31 ENCOUNTER — Other Ambulatory Visit: Payer: Self-pay

## 2021-03-31 DIAGNOSIS — Z794 Long term (current) use of insulin: Secondary | ICD-10-CM | POA: Diagnosis not present

## 2021-03-31 DIAGNOSIS — Z888 Allergy status to other drugs, medicaments and biological substances status: Secondary | ICD-10-CM | POA: Diagnosis not present

## 2021-03-31 DIAGNOSIS — Z419 Encounter for procedure for purposes other than remedying health state, unspecified: Secondary | ICD-10-CM

## 2021-03-31 DIAGNOSIS — W19XXXA Unspecified fall, initial encounter: Secondary | ICD-10-CM | POA: Diagnosis present

## 2021-03-31 DIAGNOSIS — S22009A Unspecified fracture of unspecified thoracic vertebra, initial encounter for closed fracture: Secondary | ICD-10-CM | POA: Diagnosis not present

## 2021-03-31 DIAGNOSIS — Z981 Arthrodesis status: Secondary | ICD-10-CM | POA: Diagnosis not present

## 2021-03-31 DIAGNOSIS — Z20822 Contact with and (suspected) exposure to covid-19: Secondary | ICD-10-CM | POA: Diagnosis present

## 2021-03-31 DIAGNOSIS — Z882 Allergy status to sulfonamides status: Secondary | ICD-10-CM | POA: Diagnosis not present

## 2021-03-31 DIAGNOSIS — S22079A Unspecified fracture of T9-T10 vertebra, initial encounter for closed fracture: Secondary | ICD-10-CM | POA: Diagnosis present

## 2021-03-31 DIAGNOSIS — Z79899 Other long term (current) drug therapy: Secondary | ICD-10-CM | POA: Diagnosis not present

## 2021-03-31 DIAGNOSIS — Z8249 Family history of ischemic heart disease and other diseases of the circulatory system: Secondary | ICD-10-CM | POA: Diagnosis not present

## 2021-03-31 DIAGNOSIS — M454 Ankylosing spondylitis of thoracic region: Secondary | ICD-10-CM | POA: Diagnosis present

## 2021-03-31 DIAGNOSIS — I10 Essential (primary) hypertension: Secondary | ICD-10-CM | POA: Diagnosis present

## 2021-03-31 DIAGNOSIS — Z9049 Acquired absence of other specified parts of digestive tract: Secondary | ICD-10-CM

## 2021-03-31 SURGERY — POSTERIOR LUMBAR FUSION 1 LEVEL
Anesthesia: General | Site: Back

## 2021-03-31 MED ORDER — LACTATED RINGERS IV SOLN: INTRAVENOUS | Status: DC

## 2021-03-31 MED ORDER — METOPROLOL SUCCINATE ER 25 MG PO TB24
25.0000 mg | ORAL_TABLET | Freq: Every morning | ORAL | Status: DC
Start: 2021-04-01 — End: 2021-04-01
  Administered 2021-04-01: 25 mg via ORAL
  Filled 2021-03-31: qty 1

## 2021-03-31 MED ORDER — KETOROLAC TROMETHAMINE 15 MG/ML IJ SOLN
15.0000 mg | Freq: Four times a day (QID) | INTRAMUSCULAR | Status: DC
  Administered 2021-03-31 – 2021-04-01 (×3): 15 mg via INTRAVENOUS
  Filled 2021-03-31 (×3): qty 1

## 2021-03-31 MED ORDER — SENNA 8.6 MG PO TABS
1.0000 | ORAL_TABLET | Freq: Two times a day (BID) | ORAL | Status: DC
  Administered 2021-03-31 – 2021-04-01 (×2): 8.6 mg via ORAL
  Filled 2021-03-31 (×2): qty 1

## 2021-03-31 MED ORDER — POTASSIUM CHLORIDE IN NACL 20-0.9 MEQ/L-% IV SOLN: INTRAVENOUS | Status: DC

## 2021-03-31 MED ORDER — SUCCINYLCHOLINE CHLORIDE 200 MG/10ML IV SOSY
PREFILLED_SYRINGE | INTRAVENOUS | Status: DC | PRN
  Administered 2021-03-31: 120 mg via INTRAVENOUS

## 2021-03-31 MED ORDER — THROMBIN 5000 UNITS EX SOLR
CUTANEOUS | Status: AC
  Filled 2021-03-31: qty 5000

## 2021-03-31 MED ORDER — LIDOCAINE 2% (20 MG/ML) 5 ML SYRINGE
INTRAMUSCULAR | Status: DC | PRN
  Administered 2021-03-31: 60 mg via INTRAVENOUS

## 2021-03-31 MED ORDER — DEXAMETHASONE SODIUM PHOSPHATE 10 MG/ML IJ SOLN
INTRAMUSCULAR | Status: AC
  Filled 2021-03-31: qty 1

## 2021-03-31 MED ORDER — PHENOL 1.4 % MT LIQD: 1.0000 | OROMUCOSAL | Status: DC | PRN

## 2021-03-31 MED ORDER — SUGAMMADEX SODIUM 200 MG/2ML IV SOLN
INTRAVENOUS | Status: DC | PRN
  Administered 2021-03-31: 200 mg via INTRAVENOUS

## 2021-03-31 MED ORDER — ACETAMINOPHEN 500 MG PO TABS: 1000.0000 mg | ORAL_TABLET | Freq: Once | ORAL | Status: DC | PRN

## 2021-03-31 MED ORDER — PROPOFOL 10 MG/ML IV BOLUS
INTRAVENOUS | Status: DC | PRN
  Administered 2021-03-31: 160 mg via INTRAVENOUS

## 2021-03-31 MED ORDER — FENTANYL CITRATE (PF) 250 MCG/5ML IJ SOLN
INTRAMUSCULAR | Status: AC
  Filled 2021-03-31: qty 5

## 2021-03-31 MED ORDER — FENTANYL CITRATE (PF) 100 MCG/2ML IJ SOLN
INTRAMUSCULAR | Status: AC
  Filled 2021-03-31: qty 2

## 2021-03-31 MED ORDER — KETAMINE HCL 50 MG/5ML IJ SOSY
PREFILLED_SYRINGE | INTRAMUSCULAR | Status: AC
  Filled 2021-03-31: qty 5

## 2021-03-31 MED ORDER — CEFAZOLIN SODIUM-DEXTROSE 2-4 GM/100ML-% IV SOLN
2.0000 g | Freq: Three times a day (TID) | INTRAVENOUS | Status: AC
  Administered 2021-03-31 – 2021-04-01 (×2): 2 g via INTRAVENOUS
  Filled 2021-03-31 (×2): qty 100

## 2021-03-31 MED ORDER — OXYCODONE HCL 5 MG PO TABS: 5.0000 mg | ORAL_TABLET | Freq: Once | ORAL | Status: DC | PRN

## 2021-03-31 MED ORDER — ALLOPURINOL 300 MG PO TABS
300.0000 mg | ORAL_TABLET | Freq: Every morning | ORAL | Status: DC
  Administered 2021-04-01: 300 mg via ORAL
  Filled 2021-03-31: qty 1

## 2021-03-31 MED ORDER — ACETAMINOPHEN 10 MG/ML IV SOLN: 1000.0000 mg | Freq: Once | INTRAVENOUS | Status: DC | PRN

## 2021-03-31 MED ORDER — LIDOCAINE 2% (20 MG/ML) 5 ML SYRINGE
INTRAMUSCULAR | Status: AC
  Filled 2021-03-31: qty 5

## 2021-03-31 MED ORDER — FENTANYL CITRATE (PF) 250 MCG/5ML IJ SOLN
INTRAMUSCULAR | Status: DC | PRN
Start: 2021-03-31 — End: 2021-03-31
  Administered 2021-03-31 (×3): 50 ug via INTRAVENOUS
  Administered 2021-03-31: 100 ug via INTRAVENOUS

## 2021-03-31 MED ORDER — ACETAMINOPHEN 10 MG/ML IV SOLN
INTRAVENOUS | Status: DC | PRN
  Administered 2021-03-31: 1000 mg via INTRAVENOUS

## 2021-03-31 MED ORDER — CHLORHEXIDINE GLUCONATE CLOTH 2 % EX PADS: 6.0000 | MEDICATED_PAD | Freq: Once | CUTANEOUS | Status: DC

## 2021-03-31 MED ORDER — OXYCODONE-ACETAMINOPHEN 5-325 MG PO TABS
2.0000 | ORAL_TABLET | ORAL | Status: DC | PRN
  Administered 2021-03-31: 1 via ORAL
  Administered 2021-03-31 – 2021-04-01 (×3): 2 via ORAL
  Filled 2021-03-31 (×4): qty 2

## 2021-03-31 MED ORDER — BUPIVACAINE HCL (PF) 0.25 % IJ SOLN
INTRAMUSCULAR | Status: DC | PRN
  Administered 2021-03-31: 10 mL

## 2021-03-31 MED ORDER — ONDANSETRON HCL 4 MG/2ML IJ SOLN: 4.0000 mg | Freq: Four times a day (QID) | INTRAMUSCULAR | Status: DC | PRN

## 2021-03-31 MED ORDER — DEXMEDETOMIDINE (PRECEDEX) IN NS 20 MCG/5ML (4 MCG/ML) IV SYRINGE
PREFILLED_SYRINGE | INTRAVENOUS | Status: DC | PRN
  Administered 2021-03-31: 8 ug via INTRAVENOUS
  Administered 2021-03-31: 12 ug via INTRAVENOUS

## 2021-03-31 MED ORDER — ACETAMINOPHEN 160 MG/5ML PO SOLN: 1000.0000 mg | Freq: Once | ORAL | Status: DC | PRN

## 2021-03-31 MED ORDER — ORAL CARE MOUTH RINSE: 15.0000 mL | Freq: Once | OROMUCOSAL | Status: AC

## 2021-03-31 MED ORDER — PROPOFOL 1000 MG/100ML IV EMUL
INTRAVENOUS | Status: AC
  Filled 2021-03-31: qty 100

## 2021-03-31 MED ORDER — 0.9 % SODIUM CHLORIDE (POUR BTL) OPTIME
TOPICAL | Status: DC | PRN
  Administered 2021-03-31: 1000 mL

## 2021-03-31 MED ORDER — ONDANSETRON HCL 4 MG/2ML IJ SOLN
INTRAMUSCULAR | Status: AC
  Filled 2021-03-31: qty 2

## 2021-03-31 MED ORDER — ROCURONIUM BROMIDE 10 MG/ML (PF) SYRINGE
PREFILLED_SYRINGE | INTRAVENOUS | Status: DC | PRN
  Administered 2021-03-31: 50 mg via INTRAVENOUS
  Administered 2021-03-31 (×2): 10 mg via INTRAVENOUS
  Administered 2021-03-31: 50 mg via INTRAVENOUS

## 2021-03-31 MED ORDER — SODIUM CHLORIDE 0.9% FLUSH: 3.0000 mL | Freq: Two times a day (BID) | INTRAVENOUS | Status: DC

## 2021-03-31 MED ORDER — ONDANSETRON HCL 4 MG PO TABS: 4.0000 mg | ORAL_TABLET | Freq: Four times a day (QID) | ORAL | Status: DC | PRN

## 2021-03-31 MED ORDER — CEFAZOLIN SODIUM-DEXTROSE 2-3 GM-%(50ML) IV SOLR: INTRAVENOUS | Status: DC | PRN

## 2021-03-31 MED ORDER — CEFAZOLIN SODIUM 1 G IJ SOLR
INTRAMUSCULAR | Status: AC
  Filled 2021-03-31: qty 20

## 2021-03-31 MED ORDER — KETAMINE HCL-SODIUM CHLORIDE 100-0.9 MG/10ML-% IV SOSY
PREFILLED_SYRINGE | INTRAVENOUS | Status: DC | PRN
  Administered 2021-03-31 (×2): 10 mg via INTRAVENOUS
  Administered 2021-03-31: 30 mg via INTRAVENOUS

## 2021-03-31 MED ORDER — ACETAMINOPHEN 650 MG RE SUPP: 650.0000 mg | RECTAL | Status: DC | PRN

## 2021-03-31 MED ORDER — SODIUM CHLORIDE 0.9 % IV SOLN
250.0000 mL | INTRAVENOUS | Status: DC
  Administered 2021-03-31: 250 mL via INTRAVENOUS

## 2021-03-31 MED ORDER — OXYCODONE HCL 5 MG/5ML PO SOLN: 5.0000 mg | Freq: Once | ORAL | Status: DC | PRN

## 2021-03-31 MED ORDER — DEXAMETHASONE SODIUM PHOSPHATE 10 MG/ML IJ SOLN
INTRAMUSCULAR | Status: DC | PRN
  Administered 2021-03-31: 10 mg via INTRAVENOUS

## 2021-03-31 MED ORDER — CEFAZOLIN SODIUM-DEXTROSE 2-4 GM/100ML-% IV SOLN
2.0000 g | INTRAVENOUS | Status: AC
  Administered 2021-03-31: 2 g via INTRAVENOUS
  Filled 2021-03-31: qty 100

## 2021-03-31 MED ORDER — ACETAMINOPHEN 325 MG PO TABS: 650.0000 mg | ORAL_TABLET | ORAL | Status: DC | PRN

## 2021-03-31 MED ORDER — ROCURONIUM BROMIDE 10 MG/ML (PF) SYRINGE
PREFILLED_SYRINGE | INTRAVENOUS | Status: AC
  Filled 2021-03-31: qty 20

## 2021-03-31 MED ORDER — ACETAMINOPHEN 10 MG/ML IV SOLN
INTRAVENOUS | Status: AC
  Filled 2021-03-31: qty 100

## 2021-03-31 MED ORDER — SODIUM CHLORIDE 0.9% FLUSH: 3.0000 mL | INTRAVENOUS | Status: DC | PRN

## 2021-03-31 MED ORDER — METHOCARBAMOL 500 MG PO TABS
500.0000 mg | ORAL_TABLET | Freq: Four times a day (QID) | ORAL | Status: DC | PRN
  Administered 2021-03-31 – 2021-04-01 (×2): 500 mg via ORAL
  Filled 2021-03-31 (×2): qty 1

## 2021-03-31 MED ORDER — MIDAZOLAM HCL 2 MG/2ML IJ SOLN
INTRAMUSCULAR | Status: AC
  Filled 2021-03-31: qty 2

## 2021-03-31 MED ORDER — CHLORHEXIDINE GLUCONATE 0.12 % MT SOLN
15.0000 mL | Freq: Once | OROMUCOSAL | Status: AC
  Administered 2021-03-31: 15 mL via OROMUCOSAL
  Filled 2021-03-31: qty 15

## 2021-03-31 MED ORDER — MIDAZOLAM HCL 2 MG/2ML IJ SOLN
INTRAMUSCULAR | Status: DC | PRN
  Administered 2021-03-31: 2 mg via INTRAVENOUS

## 2021-03-31 MED ORDER — FENTANYL CITRATE (PF) 100 MCG/2ML IJ SOLN
25.0000 ug | INTRAMUSCULAR | Status: DC | PRN
  Administered 2021-03-31 (×3): 50 ug via INTRAVENOUS

## 2021-03-31 MED ORDER — BUPIVACAINE HCL (PF) 0.25 % IJ SOLN
INTRAMUSCULAR | Status: AC
  Filled 2021-03-31: qty 30

## 2021-03-31 MED ORDER — MENTHOL 3 MG MT LOZG: 1.0000 | LOZENGE | OROMUCOSAL | Status: DC | PRN

## 2021-03-31 MED ORDER — HYDROMORPHONE HCL 1 MG/ML IJ SOLN
0.5000 mg | INTRAMUSCULAR | Status: DC | PRN
  Administered 2021-03-31: 0.5 mg via INTRAVENOUS
  Filled 2021-03-31: qty 0.5

## 2021-03-31 MED ORDER — METHOCARBAMOL 1000 MG/10ML IJ SOLN: 500.0000 mg | Freq: Four times a day (QID) | INTRAVENOUS | Status: DC | PRN

## 2021-03-31 MED ORDER — ONDANSETRON HCL 4 MG/2ML IJ SOLN
INTRAMUSCULAR | Status: DC | PRN
Start: 2021-03-31 — End: 2021-03-31
  Administered 2021-03-31: 4 mg via INTRAVENOUS

## 2021-03-31 MED ORDER — THROMBIN 5000 UNITS EX SOLR
OROMUCOSAL | Status: DC | PRN
  Administered 2021-03-31: 5 mL via TOPICAL

## 2021-03-31 SURGICAL SUPPLY — 60 items
ADH SKN CLS APL DERMABOND .7 (GAUZE/BANDAGES/DRESSINGS) ×1
APL SKNCLS STERI-STRIP NONHPOA (GAUZE/BANDAGES/DRESSINGS) ×1
BAG COUNTER SPONGE SURGICOUNT (BAG) ×3 IMPLANT
BAG SPNG CNTER NS LX DISP (BAG) ×2
BASKET BONE COLLECTION (BASKET) ×2 IMPLANT
BENZOIN TINCTURE PRP APPL 2/3 (GAUZE/BANDAGES/DRESSINGS) ×2 IMPLANT
BLADE CLIPPER SURG (BLADE) ×2 IMPLANT
BUR CARBIDE MATCH 3.0 (BURR) ×2 IMPLANT
CANISTER SUCT 3000ML PPV (MISCELLANEOUS) ×2 IMPLANT
CNTNR URN SCR LID CUP LEK RST (MISCELLANEOUS) ×1 IMPLANT
CONT SPEC 4OZ STRL OR WHT (MISCELLANEOUS) ×2
COVER BACK TABLE 60X90IN (DRAPES) ×2 IMPLANT
DERMABOND ADVANCED (GAUZE/BANDAGES/DRESSINGS) ×1
DERMABOND ADVANCED .7 DNX12 (GAUZE/BANDAGES/DRESSINGS) ×1 IMPLANT
DRAPE C-ARM 42X72 X-RAY (DRAPES) ×6 IMPLANT
DRAPE LAPAROTOMY 100X72X124 (DRAPES) ×2 IMPLANT
DRAPE SURG 17X23 STRL (DRAPES) ×2 IMPLANT
DRSG OPSITE POSTOP 4X12 (GAUZE/BANDAGES/DRESSINGS) ×1 IMPLANT
DURAPREP 26ML APPLICATOR (WOUND CARE) ×2 IMPLANT
ELECT REM PT RETURN 9FT ADLT (ELECTROSURGICAL) ×2
ELECTRODE REM PT RTRN 9FT ADLT (ELECTROSURGICAL) ×1 IMPLANT
EVACUATOR 1/8 PVC DRAIN (DRAIN) ×1 IMPLANT
GAUZE 4X4 16PLY ~~LOC~~+RFID DBL (SPONGE) ×1 IMPLANT
GLOVE SURG ENC MOIS LTX SZ7 (GLOVE) IMPLANT
GLOVE SURG ENC MOIS LTX SZ8 (GLOVE) ×4 IMPLANT
GLOVE SURG UNDER POLY LF SZ7 (GLOVE) IMPLANT
GOWN STRL REUS W/ TWL LRG LVL3 (GOWN DISPOSABLE) IMPLANT
GOWN STRL REUS W/ TWL XL LVL3 (GOWN DISPOSABLE) ×2 IMPLANT
GOWN STRL REUS W/TWL 2XL LVL3 (GOWN DISPOSABLE) IMPLANT
GOWN STRL REUS W/TWL LRG LVL3 (GOWN DISPOSABLE)
GOWN STRL REUS W/TWL XL LVL3 (GOWN DISPOSABLE) ×4
HEMOSTAT POWDER KIT SURGIFOAM (HEMOSTASIS) ×2 IMPLANT
KIT BASIN OR (CUSTOM PROCEDURE TRAY) ×2 IMPLANT
KIT GRAFTMAG DEL NEURO DISP (NEUROSURGERY SUPPLIES) IMPLANT
KIT TURNOVER KIT B (KITS) ×2 IMPLANT
MATRIX STRIP NEOCORE 12C (Putty) IMPLANT
MILL MEDIUM DISP (BLADE) ×2 IMPLANT
NDL HYPO 25X1 1.5 SAFETY (NEEDLE) ×1 IMPLANT
NEEDLE HYPO 25X1 1.5 SAFETY (NEEDLE) ×2 IMPLANT
NS IRRIG 1000ML POUR BTL (IV SOLUTION) ×2 IMPLANT
PACK LAMINECTOMY NEURO (CUSTOM PROCEDURE TRAY) ×2 IMPLANT
PAD ARMBOARD 7.5X6 YLW CONV (MISCELLANEOUS) ×6 IMPLANT
PUTTY DBM 10CC (Putty) ×2 IMPLANT
ROD STRT INV 5.5X400 (Rod) ×1 IMPLANT
ROD TI STRAIGHT 5.5X500 (Rod) ×1 IMPLANT
SCREW KODIAK 5.5X45 (Screw) ×8 IMPLANT
SET SCREW (Screw) ×16 IMPLANT
SET SCREW SPNE (Screw) IMPLANT
SPONGE SURGIFOAM ABS GEL 100 (HEMOSTASIS) IMPLANT
SPONGE T-LAP 4X18 ~~LOC~~+RFID (SPONGE) ×1 IMPLANT
STRIP CLOSURE SKIN 1/2X4 (GAUZE/BANDAGES/DRESSINGS) ×4 IMPLANT
STRIP MATRIX NEOCORE 12CC (Putty) ×1 IMPLANT
SUT VIC AB 0 CT1 18XCR BRD8 (SUTURE) ×1 IMPLANT
SUT VIC AB 0 CT1 8-18 (SUTURE) ×4
SUT VIC AB 2-0 CP2 18 (SUTURE) ×5 IMPLANT
SUT VIC AB 3-0 SH 8-18 (SUTURE) ×6 IMPLANT
TOWEL GREEN STERILE (TOWEL DISPOSABLE) ×2 IMPLANT
TOWEL GREEN STERILE FF (TOWEL DISPOSABLE) ×2 IMPLANT
TRAY FOLEY MTR SLVR 16FR STAT (SET/KITS/TRAYS/PACK) ×2 IMPLANT
WATER STERILE IRR 1000ML POUR (IV SOLUTION) ×2 IMPLANT

## 2021-03-31 NOTE — H&P (Signed)
Subjective: ?Patient is a 56 y.o. male admitted for T9-10 fracture and AS. Onset of symptoms was 3 months ago, gradually worsening since that time.  The pain is rated severe, and is located at the across the TL back and radiates to low back. The pain is described as aching and occurs all day. The symptoms have been progressive. Symptoms are exacerbated by exercise, standing, and walking for more than a few minutes. MRI or CT showed AS with fracture and sublux T9-10. He fell in December and was referred to [place] in feb ? ?Past Medical History:  ?Diagnosis Date  ? Ankylosing spondylitis ([place])   ? Arthritis   ? Colitis   ? Colitis   ? Diverticulosis   ? Hypertension   ? Obesity   ? Rectal bleeding   ?  ?Past Surgical History:  ?Procedure Laterality Date  ? APPENDECTOMY    ? pt thinks it was removed during colostomy surgery  ? COLON SURGERY    ? COLONOSCOPY WITH PROPOFOL N/A 07/11/2016  ? Procedure: COLONOSCOPY WITH PROPOFOL;  Surgeon: Garlan Fair, MD;  Location: [facility];  Service: Endoscopy;  Laterality: N/A;  ? COLOSTOMY CLOSURE    ? ESOPHAGOGASTRODUODENOSCOPY  01/03/2011  ? Procedure: ESOPHAGOGASTRODUODENOSCOPY (EGD);  Surgeon: Garlan Fair, MD;  Location: [facility];  Service: Endoscopy;  Laterality: N/A;  ? NECK SURGERY  2010  ? cervical fusion  ? TONSILLECTOMY    ?  ?Prior to Admission medications   ?Medication Sig Start Date End Date Taking? Authorizing Provider  ?allopurinol (ZYLOPRIM) 300 MG tablet Take 300 mg by mouth in the morning. 11/04/19  Yes [provider]  ?celecoxib (CELEBREX) 200 MG capsule Take 1 capsule (200 mg total) by mouth 2 (two) times daily as needed. ?Patient taking differently: Take 200 mg by mouth 2 (two) times daily as needed (pain.). 10/17/20  Yes Alma Friendly, MD  ?Cholecalciferol (VITAMIN D) 125 MCG (5000 UT) CAPS Take 10,000 Units by mouth in the morning.   Yes [provider]  ?colchicine 0.6 MG tablet Take 0.6 mg by mouth daily as needed (gout  flare up). 08/24/19  Yes [provider]  ?HUMIRA PEN 40 MG/0.4ML PNKT Inject 40 mg into the skin every 14 (fourteen) days. 03/15/21  Yes [provider]  ?Magnesium 500 MG TABS Take 1,000 mg by mouth in the morning.   Yes [provider]  ?metoprolol succinate (TOPROL-XL) 25 MG 24 hr tablet Take 25 mg by mouth in the morning.   Yes [provider]  ?oxyCODONE-acetaminophen (PERCOCET) 10-325 MG tablet Take 1 tablet by mouth every 6 (six) hours as needed for pain.   Yes [provider]  ?zolpidem (AMBIEN) 10 MG tablet Take 10 mg by mouth at bedtime as needed for sleep. 01/20/20  Yes [provider]  ? ?Allergies  ?Allergen Reactions  ? Septra [Bactrim] Other (See Comments)  ?  Very bad migraine  ? Sulfamethoxazole-Trimethoprim   ?  Other reaction(s): massive headache  ?  ?Social History  ? ?Tobacco Use  ? Smoking status: Never  ? Smokeless tobacco: Never  ?Substance Use Topics  ? Alcohol use: Yes  ?  Alcohol/week: 5.0 standard drinks  ?  Types: 5 Cans of beer per week  ?  Comment: maybe 3-8 beers a week  ?  ?Family History  ?Problem Relation Age of Onset  ? Heart disease Mother   ? Heart disease Father   ? Anesthesia problems Neg Hx   ? Hypotension Neg  Hx   ? Malignant hyperthermia Neg Hx   ? Pseudochol deficiency Neg Hx   ? Colon cancer Neg Hx   ? Rectal cancer Neg Hx   ? Esophageal cancer Neg Hx   ? Liver cancer Neg Hx   ? ?  ?Review of Systems ? ?Positive ROS: neg ? ?All other systems have been reviewed and were otherwise negative with the exception of those mentioned in the HPI and as above. ? ?Objective: ?Vital signs in last 24 hours: ?Temp:  [98.7 ?F (37.1 ?C)] 98.7 ?F (37.1 ?C) (03/03 1173) ?Pulse Rate:  [58] 58 (03/03 0843) ?Resp:  [18] 18 (03/03 0843) ?BP: (140)/(90) 140/90 (03/03 0843) ?SpO2:  [95 %] 95 % (03/03 0843) ?Weight:  [96.6 kg] 96.6 kg (03/03 0843) ? ?General Appearance: Alert, cooperative, no distress, appears stated age ?Head: Normocephalic,  without obvious abnormality, atraumatic ?Eyes: PERRL, conjunctiva/corneas clear, EOM's intact    ?Neck: Supple, symmetrical, trachea midline ?Back: Symmetric, no curvature, ROM normal, no CVA tenderness ?Lungs:  respirations unlabored ?Heart: Regular rate and rhythm ?Abdomen: Soft, non-tender ?Extremities: Extremities normal, atraumatic, no cyanosis or edema ?Pulses: 2+ and symmetric all extremities ?Skin: Skin color, texture, turgor normal, no rashes or lesions ? ?NEUROLOGIC:  ? ?Mental status: Alert and oriented x4,  no aphasia, good attention span, fund of knowledge, and memory ?Motor Exam - grossly normal ?Sensory Exam - grossly normal ?Reflexes: 1+ ?Coordination - grossly normal ?Gait - grossly normal ?Balance - grossly normal ?Cranial Nerves: ?I: smell Not tested  ?II: visual acuity  OS: nl    OD: nl  ?II: visual fields Full to confrontation  ?II: pupils Equal, round, reactive to light  ?III,VII: ptosis None  ?III,IV,VI: extraocular muscles  Full ROM  ?V: mastication Normal  ?V: facial light touch sensation  Normal  ?V,VII: corneal reflex  Present  ?VII: facial muscle function - upper  Normal  ?VII: facial muscle function - lower Normal  ?VIII: hearing Not tested  ?IX: soft palate elevation  Normal  ?IX,X: gag reflex Present  ?XI: trapezius strength  5/5  ?XI: sternocleidomastoid strength 5/5  ?XI: neck flexion strength  5/5  ?XII: tongue strength  Normal  ? ? ?Data Review ?Lab Results  ?Component Value Date  ? WBC 7.2 03/30/2021  ? HGB 14.7 03/30/2021  ? HCT 42.1 03/30/2021  ? MCV 90.3 03/30/2021  ? PLT 362 03/30/2021  ? ?Lab Results  ?Component Value Date  ? NA 132 (L) 03/30/2021  ? K 3.6 03/30/2021  ? CL 97 (L) 03/30/2021  ? CO2 22 03/30/2021  ? BUN 6 03/30/2021  ? CREATININE 0.86 03/30/2021  ? GLUCOSE 125 (H) 03/30/2021  ? ?Lab Results  ?Component Value Date  ? INR 1.3 (H) 10/18/2020  ? ? ?Assessment/Plan: ? ?Estimated body mass index is 32.39 kg/m? as calculated from the following: ?  Height as of this  encounter: 5' 8"  (1.727 m). ?  Weight as of this encounter: 96.6 kg. ?Patient admitted for ORIF T9-10 fracture. Patient has failed a reasonable attempt at conservative therapy. ? ?I explained the condition and procedure to the patient and answered any questions.  Patient wishes to proceed with procedure as planned. Understands risks/ benefits and typical outcomes of procedure. ? ? ?Eustace Moore ?03/31/2021 11:50 AM ? ?

## 2021-03-31 NOTE — Progress Notes (Signed)
Orthopedic Tech Progress Note ?Patient Details:  ?Austin Chang ?[birth year] ?[id number] ?Called in order for custom clamshell brace ?Patient ID: Austin Chang, male   DOB: 1966-01-11, 56 y.o.   MRN: [id number] ? ?Deleah Tison A Alanzo Lamb ?03/31/2021, 4:26 PM ? ?

## 2021-03-31 NOTE — Op Note (Signed)
03/31/2021 ? ?3:46 PM ? ?PATIENT:  Austin Chang  56 y.o. male ? ?PRE-OPERATIVE DIAGNOSIS: Ankylosing spondylitis with T9-10 fracture ? ?POST-OPERATIVE DIAGNOSIS:  same ? ?PROCEDURE: Posterior thoracic segmental pedicle screw fixation T8-T12 with ATEC pedicle screws, posterior thoracic fusion T8-T12 utilizing morselized allograft ? ?SURGEON:  Sherley Bounds, MD ? ?ASSISTANTS: Glenford Peers FNP ? ?ANESTHESIA:   General ? ?EBL: 150 ml ? ?Total I/O ?In: 1400 [I.V.:1200; IV Piggyback:200] ?Out: 325 [Urine:175; Blood:150] ? ?BLOOD ADMINISTERED: none ? ?DRAINS: None ? ?SPECIMEN:  none ? ?INDICATION FOR PROCEDURE: This patient presented with severe thoracic back pain. Imaging showed unstable T9-10 fracture with ankylosing spondylitis. The patient tried conservative measures without relief. Pain was debilitating. Recommended stabilization with segmental fixation T8-T12. Patient understood the risks, benefits, and alternatives and potential outcomes and wished to proceed. ? ?PROCEDURE DETAILS: The patient was taken to the operating room and after induction of adequate generalized endotracheal anesthesia, the patient was rolled into the prone position on chest rolls and all pressure points were padded.  We localized our incision with AP fluoroscopy from T7-L1.  The thoracic and lumbar region was cleaned with Betadine scrub and then prepped with DuraPrep and draped in the usual sterile fashion. 10 cc of local anesthesia was injected and then a dorsal midline incision was made and carried down to the thoracic fascia. The fascia was opened and the paraspinous musculature was taken down in a subperiosteal fashion to expose T7-L1 bilaterally.  I exposed the pedicle screw entry zones in the transverse processes and the costovertebral joints.  Intraoperative AP and lateral fluoroscopy confirmed my level and all in the room agreed, and we started with the placement of the T11 and T12 pedicle screws.  We localize the pedicle screw entry  zones utilizing AP and lateral fluoroscopy.  I probed each pedicle with the thoracic pedicle probe palpated with a ball probe to assure no break through the cortex, tapped with a 4.5 tap, and then placed 5.5 x 45 mm pedicle screws at T11 and T12.  We then checked this placement with AP and lateral fluoroscopy.  We then moved up to T8 and T9 and localize the pedicle screw entry zones with AP and lateral fluoroscopy.  These were very difficult to see and the pedicles were small.  We started at what we felt like was the 9:00 starting position on the left in the 6:00 starting position on the right to start on the lateral pedicle.  We probed each pedicle with the thoracic pedicle probe, palpated with a ball probe.  We then tapped with a 4.5 tap and placed 5.5 x 45 mm pedicle screws at T8 and T9.  We identified the T9-10 fracture but did not feel that it needed open reduction or decompression.  We checked AP and lateral fluoroscopy.  We then cut to 160 mm rods and bent them into a gentle kyphosis and placed these in the multiaxial screw heads of the pedicle screws.  We then locked these into position with the locking caps and antitorque device.  We then got our final AP and lateral shots.  We irrigated with saline solution.  We decorticated the posterior elements from T8-T12 and placed a mixture of morselized allograft to perform arthrodesis across the submitted segments.  Closed the fascia with 0 Vicryl. I closed the subcutaneous tissues with 2-0 Vicryl and the subcuticular tissues with 3-0 Vicryl. The skin was then closed with Dermabond, benzoin and Steri-Strips. The drapes were removed, a sterile dressing was  applied.  My nurse practitioner was involved in the exposure, the placement of the pedicle screws, the fusion, and the closure. the patient was awakened from general anesthesia and transferred to the recovery room in stable condition. At the end of the procedure all sponge, needle and instrument counts were  correct. ? ? ? ?PLAN OF CARE: Admit to inpatient  ? ?PATIENT DISPOSITION:  PACU - hemodynamically stable. ?  ?Delay start of Pharmacological VTE agent (>24hrs) due to surgical blood loss or risk of bleeding:  yes ? ? ? ? ? ? ? ? ? ? ? ? ? ? ?

## 2021-03-31 NOTE — Anesthesia Procedure Notes (Addendum)
Procedure Name: Intubation ?Date/Time: 03/31/2021 12:08 PM ?Performed by: Bryson Corona, CRNA ?Pre-anesthesia Checklist: Patient identified, Emergency Drugs available, Suction available and Patient being monitored ?Patient Re-evaluated:Patient Re-evaluated prior to induction ?Oxygen Delivery Method: Circle System Utilized ?Preoxygenation: Pre-oxygenation with 100% oxygen ?Induction Type: IV induction ?Laryngoscope Size: Glidescope and 4 ?Grade View: Grade I ?Tube type: Oral ?Tube size: 7.0 mm ?Number of attempts: 1 ?Airway Equipment and Method: Stylet ?Placement Confirmation: ETT inserted through vocal cords under direct vision, positive ETCO2 and breath sounds checked- equal and bilateral ?Secured at: 22 cm ?Tube secured with: Tape ?Dental Injury: Teeth and Oropharynx as per pre-operative assessment  ?Difficulty Due To: Difficulty was anticipated and Difficult Airway- due to reduced neck mobility ?Future Recommendations: Recommend- induction with short-acting agent, and alternative techniques readily available ?Comments: Elective glidescope intubation due to severe decreased neck ROM and previous neck surgery. Intubation performed by Danton Clap, EMT Student under direction of CRNA and Dr. Ermalene Postin. Grade 1 view. Atraumatic intubation. ? ? ? ? ?

## 2021-03-31 NOTE — Anesthesia Preprocedure Evaluation (Addendum)
Anesthesia Evaluation  ?Patient identified by MRN, date of birth, ID band ?Patient awake ? ? ? ?Reviewed: ?Allergy & Precautions, NPO status , Patient's Chart, lab work & pertinent test results ? ?History of Anesthesia Complications ?Negative for: history of anesthetic complications ? ?Airway ?Mallampati: III ? ?TM Distance: >3 FB ?Neck ROM: Full ? ? ? Dental ? ?(+) Teeth Intact, Dental Advisory Given ?  ?Pulmonary ?neg pulmonary ROS,  ?  ?breath sounds clear to auscultation ? ? ? ? ? ? Cardiovascular ?hypertension, Pt. on medications and Pt. on home beta blockers ?(-) angina(-) Past MI and (-) CHF  ?Rhythm:Regular  ? ?  ?Neuro/Psych ?negative neurological ROS ? negative psych ROS  ? GI/Hepatic ?Neg liver ROS, UC ?  ?Endo/Other  ?Lab Results ?     Component                Value               Date                 ?     HGBA1C                   6.4 (H)             10/19/2020           ? ? Renal/GU ?negative Renal ROSLab Results ?     Component                Value               Date                 ?     CREATININE               0.86                03/30/2021           ?  ? ?  ?Musculoskeletal ? 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 Abdominal ?  ?Peds ? Hematology ?negative hematology ROS ?(+) Lab Results ?     Component                Value               Date                 ?     WBC                      7.2                 03/30/2021           ?     HGB                      14.7                03/30/2021           ?     HCT                      42.1                03/30/2021           ?     MCV  90.3                03/30/2021           ?     PLT                      362                 03/30/2021           ?   ?Anesthesia Other Findings ? ? Reproductive/Obstetrics ? ?  ? ? ? ? ? ? ? ? ? ? ? ? ? ?  ?  ? ? ? ? ? ? ? ?Anesthesia Physical ?Anesthesia Plan ? ?ASA: 2 ? ?Anesthesia Plan: General  ? ?Post-op Pain Management: Ofirmev  IV (intra-op)*  ? ?Induction: Intravenous ? ?PONV Risk Score and Plan: 2 and Ondansetron and Dexamethasone ? ?Airway Management Planned: Oral ETT and Video Laryngoscope Planned ? ?Additional Equipment: None ? ?Intra-op Plan:  ? ?Post-operative Plan: Extubation in OR ? ?Informed Consent: I have reviewed the patients History and Physical, chart, labs and discussed the procedure including the risks, benefits and alternatives for the proposed anesthesia with the patient or authorized representative who has indicated his/her understanding and acceptance.  ? ? ? ?Dental advisory given ? ?Plan Discussed with: CRNA and Anesthesiologist ? ?Anesthesia Plan Comments:   ? ? ? ? ? ? ?Anesthesia Quick Evaluation ? ?

## 2021-03-31 NOTE — Transfer of Care (Signed)
Immediate Anesthesia Transfer of Care Note ? ?Patient: Austin Chang ? ?Procedure(s) Performed: Open reduction internal fixation Thoracic Nine-Ten Fracture,pedicle screws Thoracic Seven-Lumbar One (Back) ? ?Patient Location: PACU ? ?Anesthesia Type:General ? ?Level of Consciousness: awake and alert  ? ?Airway & Oxygen Therapy: Patient Spontanous Breathing and Patient connected to face mask oxygen ? ?Post-op Assessment: Report given to RN, Post -op Vital signs reviewed and stable and Patient moving all extremities X 4 ? ?Post vital signs: Reviewed and stable ? ?Last Vitals:  ?Vitals Value Taken Time  ?BP 138/85 03/31/21 1556  ?Temp    ?Pulse 85 03/31/21 1557  ?Resp 8 03/31/21 1557  ?SpO2 100 % 03/31/21 1557  ?Vitals shown include unvalidated device data. ? ?Last Pain:  ?Vitals:  ? 03/31/21 0901  ?PainSc: 0-No pain  ?   ? ?  ? ?Complications: No notable events documented. ?

## 2021-04-01 MED ORDER — METHOCARBAMOL 500 MG PO TABS: 500.0000 mg | ORAL_TABLET | Freq: Four times a day (QID) | ORAL | 2 refills | Status: AC | PRN

## 2021-04-01 MED ORDER — OXYCODONE-ACETAMINOPHEN 10-325 MG PO TABS: 1.0000 | ORAL_TABLET | ORAL | 0 refills | Status: AC | PRN

## 2021-04-01 NOTE — Progress Notes (Signed)
Patient alert and oriented, mae's well, voiding adequate amount of urine, swallowing without difficulty, no c/o pain at time of discharge. Patient discharged home with family. Script and discharged instructions given to patient. Patient and family stated understanding of instructions given. Patient has an appointment with Dr. Ronnald Ramp in 2 weeks ?

## 2021-04-01 NOTE — Anesthesia Postprocedure Evaluation (Signed)
Anesthesia Post Note ? ?Patient: Austin Chang ? ?Procedure(s) Performed: Open reduction internal fixation Thoracic Nine-Ten Fracture,pedicle screws Thoracic Seven-Lumbar One (Back) ? ?  ? ?Patient location during evaluation: PACU ?Anesthesia Type: General ?Level of consciousness: awake and alert ?Pain management: pain level controlled ?Vital Signs Assessment: post-procedure vital signs reviewed and stable ?Respiratory status: spontaneous breathing, nonlabored ventilation, respiratory function stable and patient connected to nasal cannula oxygen ?Cardiovascular status: blood pressure returned to baseline and stable ?Postop Assessment: no apparent nausea or vomiting ?Anesthetic complications: no ? ? ?No notable events documented. ? ?Last Vitals:  ?Vitals:  ? 04/01/21 0400 04/01/21 0729  ?BP: 128/79 124/89  ?Pulse: 94 95  ?Resp: 20 18  ?Temp: 37 ?C 36.9 ?C  ?SpO2: 98% 96%  ?  ?Last Pain:  ?Vitals:  ? 04/01/21 0819  ?TempSrc:   ?PainSc: 4   ? ? ?  ?  ?  ?  ?  ?  ? ?Earlean Fidalgo ? ? ? ? ?

## 2021-04-01 NOTE — Discharge Summary (Signed)
Physician Discharge Summary  Patient ID: Austin Chang MRN: [id number] DOB/AGE: Oct 22, 1965 56 y.o.  Admit date: 03/31/2021 Discharge date: 04/01/2021  Admission Diagnoses: thoracic spine fracture    Discharge Diagnoses: same   Discharged Condition: good  Hospital Course: The patient was admitted on 03/31/2021 and taken to the operating room where the patient underwent thoracic fusion. The patient tolerated the procedure well and was taken to the recovery room and then to the floor in stable condition. The hospital course was routine. There were no complications. The wound remained clean dry and intact. Pt had appropriate back soreness. No complaints of leg pain or new N/T/W. The patient remained afebrile with stable vital signs, and tolerated a regular diet. The patient continued to increase activities, and pain was well controlled with oral pain medications.   Consults: None  Significant Diagnostic Studies:  Results for orders placed or performed during the hospital encounter of 03/30/21  SARS Coronavirus 2 by RT PCR (hospital order, performed in [facility] hospital lab) Nasopharyngeal Nasopharyngeal Swab   Specimen: Nasopharyngeal Swab  Result Value Ref Range   SARS Coronavirus 2 NEGATIVE NEGATIVE  Surgical PCR Screen   Specimen: Nasal Mucosa; Nasal Swab  Result Value Ref Range   MRSA, PCR NEGATIVE NEGATIVE   Staphylococcus aureus POSITIVE (A) NEGATIVE  Basic metabolic panel per protocol  Result Value Ref Range   Sodium 132 (L) 135 - 145 mmol/L   Potassium 3.6 3.5 - 5.1 mmol/L   Chloride 97 (L) 98 - 111 mmol/L   CO2 22 22 - 32 mmol/L   Glucose, Bld 125 (H) 70 - 99 mg/dL   BUN 6 6 - 20 mg/dL   Creatinine, Ser 0.86 0.61 - 1.24 mg/dL   Calcium 9.3 8.9 - 10.3 mg/dL   GFR, Estimated >60 >60 mL/min   Anion gap 13 5 - 15  CBC per protocol  Result Value Ref Range   WBC 7.2 4.0 - 10.5 K/uL   RBC 4.66 4.22 - 5.81 MIL/uL   Hemoglobin 14.7 13.0 - 17.0 g/dL   HCT 42.1 39.0 - 52.0 %    MCV 90.3 80.0 - 100.0 fL   MCH 31.5 26.0 - 34.0 pg   MCHC 34.9 30.0 - 36.0 g/dL   RDW 15.1 11.5 - 15.5 %   Platelets 362 150 - 400 K/uL   nRBC 0.0 0.0 - 0.2 %  Type and screen  Result Value Ref Range   ABO/RH(D) A POS    Antibody Screen POS    Sample Expiration 04/13/2021,2359    Extend sample reason NO TRANSFUSIONS OR PREGNANCY IN THE PAST 3 MONTHS    Antibody Identification ANTI K    Unit Number [id number]    Blood Component Type RED CELLS,LR    Unit division 00    Status of Unit ALLOCATED    Donor AG Type NEGATIVE FOR KELL ANTIGEN    Transfusion Status OK TO TRANSFUSE    Crossmatch Result COMPATIBLE    Unit Number [id number]    Blood Component Type RED CELLS,LR    Unit division 00    Status of Unit ALLOCATED    Donor AG Type NEGATIVE FOR KELL ANTIGEN    Transfusion Status OK TO TRANSFUSE    Crossmatch Result COMPATIBLE   BPAM RBC  Result Value Ref Range   Blood Product Unit Number [id number]    PRODUCT CODE E0382V00    Unit Type and Rh 6200    Blood Product Expiration Date 774142395320    Blood  Product Unit Number [id number]    PRODUCT CODE S5053Z76    Unit Type and Rh 6200    Blood Product Expiration Date [id number]     DG Thoracic Spine 2 View  Result Date: 03/31/2021 CLINICAL DATA:  Portable imaging provided for thoracic spine stabilization. EXAM: OPERATIVE THORACIC SPINE, FOUR VIEWS; DG C-ARM 1-60 MIN-NO REPORT COMPARISON:  Thoracic CT, 03/22/2021 FINDINGS: Submitted images show bilateral pedicle screws in the mid to lower thoracic spine, spanning a single vertebra, likely due fracture T10 vertebra. There intact interconnecting rods. Pedicle screws appear well positioned and well-seated. IMPRESSION: Portable imaging provided for operative thoracic spine fracture stabilization. Electronically Signed   By: Lajean Manes M.D.   On: 03/31/2021 15:24   CT THORACIC SPINE WO CONTRAST  Result Date: 03/23/2021 CLINICAL DATA:  Evaluate wedge compression fracture  T10. History ankylosing spondylitis. Back pain. EXAM: CT THORACIC SPINE WITHOUT CONTRAST TECHNIQUE: Multidetector CT images of the thoracic were obtained using the standard protocol without intravenous contrast. RADIATION DOSE REDUCTION: This exam was performed according to the departmental dose-optimization program which includes automated exposure control, adjustment of the mA and/or kV according to patient size and/or use of iterative reconstruction technique. COMPARISON:  CT thoracic spine 10/08/2020 FINDINGS: Alignment: 7 mm anterolisthesis T9 on T10. Alignment was normal previously. Remaining alignment normal. Vertebrae: Fracture dislocation through the disc space at T9-10. Fractures of the inferior endplate of T9 and superior endplate of X73 have developed since the prior study. Fractures through the T10 pedicle bilaterally. Fracture through the spinous process of T9. Jumped facets bilaterally at T9-10. No other fracture. Changes of ankylosing spondylitis with fusion of the vertebral bodies and posterior elements throughout the thoracic spine. Paraspinal and other soft tissues: Mild soft tissue edema around the fractures at T9-10. No paraspinous mass. Lungs are clear. Disc levels: Changes of ankylosing spondylitis. Disc degeneration with disc space calcification seen at multiple levels. No disc protrusion. Fracture dislocation at T9-10. This is causing mild spinal stenosis. Mild foraminal narrowing bilaterally. IMPRESSION: 1. Ankylosing spondylitis with fusion of the vertebral bodies and posterior elements throughout the thoracic spine 2. Fracture dislocation at T9-10 with 7 mm anterolisthesis. Fracture through the endplates at T9 and Z32 as well as in the posterior elements. There appear to be jumped facets bilaterally at T9-10. Mild spinal stenosis. 3. These results were called by telephone at the time of interpretation on 03/23/2021 at 12:16 pm to provider Sherley Bounds , who verbally acknowledged these  results. Electronically Signed   By: Franchot Gallo M.D.   On: 03/23/2021 12:17   DG C-Arm 1-60 Min-No Report  Result Date: 03/31/2021 Fluoroscopy was utilized by the requesting physician.  No radiographic interpretation.   DG C-Arm 1-60 Min-No Report  Result Date: 03/31/2021 Fluoroscopy was utilized by the requesting physician.  No radiographic interpretation.   DG C-Arm 1-60 Min-No Report  Result Date: 03/31/2021 Fluoroscopy was utilized by the requesting physician.  No radiographic interpretation.    Antibiotics:  Anti-infectives (From admission, onward)    Start     Dose/Rate Route Frequency Ordered Stop   03/31/21 1900  ceFAZolin (ANCEF) IVPB 2g/100 mL premix        2 g 200 mL/hr over 30 Minutes Intravenous Every 8 hours 03/31/21 1647 04/01/21 0429   03/31/21 0845  ceFAZolin (ANCEF) IVPB 2g/100 mL premix        2 g 200 mL/hr over 30 Minutes Intravenous On call to O.R. 03/31/21 0844 03/31/21 1226       Discharge  Exam: Blood pressure 124/89, pulse 95, temperature 98.4 F (36.9 C), temperature source Oral, resp. rate 18, height 5' 8"  (1.727 m), weight 96.6 kg, SpO2 96 %. Neurologic: Grossly normal Dressing dry  Discharge Medications:   Allergies as of 04/01/2021       Reactions   Septra [bactrim] Other (See Comments)   Very bad migraine   Sulfamethoxazole-trimethoprim    Other reaction(s): massive headache        Medication List     TAKE these medications    allopurinol 300 MG tablet Commonly known as: ZYLOPRIM Take 300 mg by mouth in the morning.   celecoxib 200 MG capsule Commonly known as: CELEBREX Take 1 capsule (200 mg total) by mouth 2 (two) times daily as needed. What changed: reasons to take this   colchicine 0.6 MG tablet Take 0.6 mg by mouth daily as needed (gout flare up).   Humira Pen 40 MG/0.4ML Pnkt Generic drug: Adalimumab Inject 40 mg into the skin every 14 (fourteen) days.   Magnesium 500 MG Tabs Take 1,000 mg by mouth in the  morning.   methocarbamol 500 MG tablet Commonly known as: ROBAXIN Take 1 tablet (500 mg total) by mouth every 6 (six) hours as needed for muscle spasms.   metoprolol succinate 25 MG 24 hr tablet Commonly known as: TOPROL-XL Take 25 mg by mouth in the morning.   oxyCODONE-acetaminophen 10-325 MG tablet Commonly known as: PERCOCET Take 1 tablet by mouth every 4 (four) hours as needed for pain. What changed: when to take this   Vitamin D 125 MCG (5000 UT) Caps Take 10,000 Units by mouth in the morning.   zolpidem 10 MG tablet Commonly known as: AMBIEN Take 10 mg by mouth at bedtime as needed for sleep.               Durable Medical Equipment  (From admission, onward)           Start     Ordered   03/31/21 1755  DME Walker rolling  Once       Question:  Patient needs a walker to treat with the following condition  Answer:  S/P lumbar fusion   03/31/21 1754   03/31/21 1755  DME 3 n 1  Once        03/31/21 1754            Disposition: home   Final Dx: T8-T12 fusion  Discharge Instructions      Remove dressing in 72 hours   Complete by: As directed    Call MD for:  difficulty breathing, headache or visual disturbances   Complete by: As directed    Call MD for:  persistant nausea and vomiting   Complete by: As directed    Call MD for:  redness, tenderness, or signs of infection (pain, swelling, redness, odor or green/yellow discharge around incision site)   Complete by: As directed    Call MD for:  severe uncontrolled pain   Complete by: As directed    Call MD for:  temperature >100.4   Complete by: As directed    Diet - low sodium heart healthy   Complete by: As directed    Increase activity slowly   Complete by: As directed         Follow-up Information     Eustace Moore, MD. Schedule an appointment as soon as possible for a visit in 2 week(s).   Specialty: Neurosurgery Contact information: [address]  [address] [place]  [address] [phone]                  Signed: Eustace Moore 04/01/2021, 9:15 AM

## 2021-04-01 NOTE — Evaluation (Signed)
Physical Therapy Evaluation ?Patient Details ?Name: Austin Chang ?MRN: [id number] ?DOB: 03-Aug-1965 ?Today's Date: 04/01/2021 ? ?History of Present Illness ? 56 y/o male admitted on 03/31/21 following posterior fusion T8-T12. PMH: HTN, anklylosing spondylitis, hx of cervical fusion 2010  ?Clinical Impression ? Patient admitted following above procedure. Patient functioning at modI level with no AD. Educated patient on back precautions, brace wear, and progressive walking program, patient demonstrated understanding. Adjusted brace to fit patient properly to bridge the gap until Monday when he receives clamshell TLSO. No further skilled PT needs identified acutely. No PT follow up recommended at this time.  ?   ?   ? ?Recommendations for follow up therapy are one component of a multi-disciplinary discharge planning process, led by the attending physician.  Recommendations may be updated based on patient status, additional functional criteria and insurance authorization. ? ?Follow Up Recommendations No PT follow up ? ?  ?Assistance Recommended at Discharge PRN  ?Patient can return home with the following ?   ? ?  ?Equipment Recommendations None recommended by PT  ?Recommendations for Other Services ?    ?  ?Functional Status Assessment Patient has not had a recent decline in their functional status  ? ?  ?Precautions / Restrictions Precautions ?Precautions: Back ?Precaution Booklet Issued: Yes (comment) ?Precaution Comments: Reviewed precautions and compensatory strategies ?Required Braces or Orthoses: Spinal Brace ?Spinal Brace: Thoracolumbosacral orthotic;Applied in sitting position (Will transition to clam shell brace monday per pt.) ?Restrictions ?Weight Bearing Restrictions: No  ? ?  ? ?Mobility ? Bed Mobility ?  ?  ?  ?  ?  ?  ?  ?General bed mobility comments: sitting EOB on arrival ?  ? ?Transfers ?Overall transfer level: Modified independent ?Equipment used: None ?  ?  ?  ?  ?  ?  ?  ?  ?   ? ?Ambulation/Gait ?Ambulation/Gait assistance: Modified independent (Device/Increase time) ?Gait Distance (Feet): 400 Feet ?Assistive device: None ?Gait Pattern/deviations: WFL(Within Functional Limits) ?  ?Gait velocity interpretation: >4.37 ft/sec, indicative of normal walking speed ?  ?  ? ?Stairs ?  ?  ?  ?  ?  ? ?Wheelchair Mobility ?  ? ?Modified Rankin (Stroke Patients Only) ?  ? ?  ? ?Balance Overall balance assessment: No apparent balance deficits (not formally assessed) ?  ?  ?  ?  ?  ?  ?  ?  ?  ?  ?  ?  ?  ?  ?  ?  ?  ?  ?   ? ? ? ?Pertinent Vitals/Pain Pain Assessment ?Pain Assessment: 0-10 ?Pain Score: 6  ?Pain Location: Incision site ?Pain Descriptors / Indicators: Aching, Discomfort, Grimacing ?Pain Intervention(s): Monitored during session  ? ? ?Home Living Family/patient expects to be discharged to:: Private residence ?Living Arrangements: Spouse/significant other ?Available Help at Discharge: Family ?Type of Home: House ?Home Access: Stairs to enter ?Entrance Stairs-Rails: None ?Entrance Stairs-Number of Steps: 1 step ?  ?Home Layout: One level ?Home Equipment: Shower seat;Cane - single point ?   ?  ?Prior Function Prior Level of Function : Independent/Modified Independent;Working/employed;Driving ?  ?  ?  ?  ?  ?  ?  ?  ?  ? ? ?Hand Dominance  ? Dominant Hand: Right ? ?  ?Extremity/Trunk Assessment  ? Upper Extremity Assessment ?Upper Extremity Assessment: Defer to OT evaluation ?  ? ?Lower Extremity Assessment ?Lower Extremity Assessment: Overall WFL for tasks assessed ?  ? ?Cervical / Trunk Assessment ?Cervical / Trunk Assessment:  Kyphotic;Back Surgery  ?Communication  ? Communication: No difficulties  ?Cognition Arousal/Alertness: Awake/alert ?Behavior During Therapy: [facility] for tasks assessed/performed ?Overall Cognitive Status: Within Functional Limits for tasks assessed ?  ?  ?  ?  ?  ?  ?  ?  ?  ?  ?  ?  ?  ?  ?  ?  ?  ?  ?  ? ?  ?General Comments General comments (skin integrity, edema,  etc.): VSS on RA, honeycomb dressing intact with minimal drainage apparent ? ?  ?Exercises    ? ?Assessment/Plan  ?  ?PT Assessment Patient does not need any further PT services  ?PT Problem List   ? ?   ?  ?PT Treatment Interventions     ? ?PT Goals (Current goals can be found in the Care Plan section)  ?Acute Rehab PT Goals ?Patient Stated Goal: to go home ?PT Goal Formulation: All assessment and education complete, DC therapy ? ?  ?Frequency   ?  ? ? ?Co-evaluation   ?  ?  ?  ?  ? ? ?  ?AM-PAC PT "6 Clicks" Mobility  ?Outcome Measure Help needed turning from your back to your side while in a flat bed without using bedrails?: None ?Help needed moving from lying on your back to sitting on the side of a flat bed without using bedrails?: None ?Help needed moving to and from a bed to a chair (including a wheelchair)?: None ?Help needed standing up from a chair using your arms (e.g., wheelchair or bedside chair)?: None ?Help needed to walk in hospital room?: None ?Help needed climbing 3-5 steps with a railing? : None ?6 Click Score: 24 ? ?  ?End of Session Equipment Utilized During Treatment: Back brace ?Activity Tolerance: Patient tolerated treatment well ?Patient left: in bed;with call bell/phone within reach ?Nurse Communication: Mobility status ?PT Visit Diagnosis: Muscle weakness (generalized) (M62.81) ?  ? ?Time: 2563-8937 ?PT Time Calculation (min) (ACUTE ONLY): 9 min ? ? ?Charges:   PT Evaluation ?$PT Eval Low Complexity: 1 Low ?  ?  ?   ? ? ?Ajeenah Heiny A. Gilford Rile, PT, DPT ?Acute Rehabilitation Services ?Pager [phone] ?Office [phone] ? ? ?Khani Paino A Alyah Boehning ?04/01/2021, 9:38 AM ? ?

## 2021-04-01 NOTE — Evaluation (Signed)
Occupational Therapy Evaluation ?Patient Details ?Name: Austin Chang ?MRN: [id number] ?DOB: 1965-02-15 ?Today's Date: 04/01/2021 ? ? ?History of Present Illness 56 y/o male admitted on 03/31/21 following posterior fusion T8-T12. PMH: HTN, anklylosing spondylitis, hx of cervical fusion 2010  ? ?Clinical Impression ?  ?Pt admitted for procedure listed above. PTA pt reported that he was independent with all ADL's and IADL's, including working as a Careers adviser. At this time, pt requiring increased time to complete ADL's, however he is able to complete them independently, following compensatory strategies. Pt was educated on spinal precautions and spinal brace usage (at this time with TLSO, reports he will receive a clam shell brace on 3/6). Pt has no further OT needs at this time and acute OT will sign off.   ?   ? ?Recommendations for follow up therapy are one component of a multi-disciplinary discharge planning process, led by the attending physician.  Recommendations may be updated based on patient status, additional functional criteria and insurance authorization.  ? ?Follow Up Recommendations ? No OT follow up  ?  ?Assistance Recommended at Discharge PRN  ?Patient can return home with the following A little help with bathing/dressing/bathroom ? ?  ?Functional Status Assessment ? Patient has had a recent decline in their functional status and demonstrates the ability to make significant improvements in function in a reasonable and predictable amount of time.  ?Equipment Recommendations ? None recommended by OT  ?  ?Recommendations for Other Services   ? ? ?  ?Precautions / Restrictions Precautions ?Precautions: Back ?Precaution Booklet Issued: Yes (comment) ?Precaution Comments: Reviewed precautions and compensatory strategies ?Required Braces or Orthoses: Spinal Brace ?Spinal Brace: Thoracolumbosacral orthotic;Applied in sitting position (Will transition to clam shell brace monday per pt.) ?Restrictions ?Weight  Bearing Restrictions: No  ? ?  ? ?Mobility Bed Mobility ?Overal bed mobility: Modified Independent ?  ?  ?  ?  ?  ?  ?General bed mobility comments: Reviewed log roll, pt able to demo with no physical assist ?  ? ?Transfers ?Overall transfer level: Modified independent ?Equipment used: None ?  ?  ?  ?  ?  ?  ?  ?General transfer comment: Able to stand with increased time due to pain ?  ? ?  ?Balance Overall balance assessment: No apparent balance deficits (not formally assessed) ?  ?  ?  ?  ?  ?  ?  ?  ?  ?  ?  ?  ?  ?  ?  ?  ?  ?  ?   ? ?ADL either performed or assessed with clinical judgement  ? ?ADL Overall ADL's : Modified independent ?  ?  ?  ?  ?  ?  ?  ?  ?  ?  ?  ?  ?  ?  ?  ?  ?  ?  ?  ?General ADL Comments: Pt able to complete all ADL's, following compensatory strategies with no assist  ? ? ? ?Vision Baseline Vision/History: 0 No visual deficits ?Ability to See in Adequate Light: 0 Adequate ?Patient Visual Report: No change from baseline ?Vision Assessment?: No apparent visual deficits  ?   ?Perception   ?  ?Praxis   ?  ? ?Pertinent Vitals/Pain Pain Assessment ?Pain Assessment: 0-10 ?Pain Score: 6  ?Pain Location: Incision site ?Pain Descriptors / Indicators: Aching, Discomfort, Grimacing ?Pain Intervention(s): Monitored during session, Repositioned  ? ? ? ?Hand Dominance Right ?  ?Extremity/Trunk Assessment Upper Extremity Assessment ?Upper Extremity Assessment:  Overall [facility] for tasks assessed ?  ?Lower Extremity Assessment ?Lower Extremity Assessment: Defer to PT evaluation ?  ?Cervical / Trunk Assessment ?Cervical / Trunk Assessment: Kyphotic;Back Surgery ?  ?Communication Communication ?Communication: No difficulties ?  ?Cognition Arousal/Alertness: Awake/alert ?Behavior During Therapy: [facility] for tasks assessed/performed ?Overall Cognitive Status: Within Functional Limits for tasks assessed ?  ?  ?  ?  ?  ?  ?  ?  ?  ?  ?  ?  ?  ?  ?  ?  ?  ?  ?  ?General Comments  VSS on RA, honeycomb dressing intact  with minimal drainage apparent ? ?  ?Exercises   ?  ?Shoulder Instructions    ? ? ?Home Living Family/patient expects to be discharged to:: Private residence ?Living Arrangements: Spouse/significant other ?Available Help at Discharge: Family ?Type of Home: House ?Home Access: Stairs to enter ?Entrance Stairs-Number of Steps: 1 step ?Entrance Stairs-Rails: None ?Home Layout: One level ?  ?  ?Bathroom Shower/Tub: Walk-in shower ?  ?Bathroom Toilet: Handicapped height ?Bathroom Accessibility: Yes ?How Accessible: Accessible via walker ?Home Equipment: Shower seat;Cane - single point ?  ?  ?  ? ?  ?Prior Functioning/Environment Prior Level of Function : Independent/Modified Independent;Working/employed;Driving ?  ?  ?  ?  ?  ?  ?  ?  ?  ? ?  ?  ?OT Problem List: Decreased strength;Decreased activity tolerance;Impaired balance (sitting and/or standing);Pain ?  ?   ?OT Treatment/Interventions:    ?  ?OT Goals(Current goals can be found in the care plan section) Acute Rehab OT Goals ?Patient Stated Goal: To go home ?OT Goal Formulation: With patient ?Time For Goal Achievement: 04/01/21 ?Potential to Achieve Goals: Good  ?OT Frequency:   ?  ? ?Co-evaluation   ?  ?  ?  ?  ? ?  ?AM-PAC OT "6 Clicks" Daily Activity     ?Outcome Measure Help from another person eating meals?: None ?Help from another person taking care of personal grooming?: None ?Help from another person toileting, which includes using toliet, bedpan, or urinal?: None ?Help from another person bathing (including washing, rinsing, drying)?: None ?Help from another person to put on and taking off regular upper body clothing?: None ?Help from another person to put on and taking off regular lower body clothing?: None ?6 Click Score: 24 ?  ?End of Session Equipment Utilized During Treatment: Back brace ?Nurse Communication: Mobility status ? ?Activity Tolerance: Patient tolerated treatment well ?Patient left: Other (comment) (Sitting EOB) ? ?OT Visit Diagnosis:  Other abnormalities of gait and mobility (R26.89);Muscle weakness (generalized) (M62.81)  ?              ?Time: 4696-2952 ?OT Time Calculation (min): 19 min ?Charges:  OT General Charges ?$OT Visit: 1 Visit ?OT Evaluation ?$OT Eval Moderate Complexity: 1 Mod ? ?Austin Grose H., OTR/L ?Acute Rehabilitation ? ?Austin Chang ?04/01/2021, 9:33 AM ?

## 2021-04-03 LAB — TYPE AND SCREEN
ABO/RH(D): A POS
Antibody Screen: POSITIVE
Donor AG Type: NEGATIVE
Donor AG Type: NEGATIVE
Unit division: 0
Unit division: 0

## 2021-04-03 LAB — BPAM RBC
Blood Product Expiration Date: 202303252359
Blood Product Expiration Date: 202303252359
Unit Type and Rh: 6200
Unit Type and Rh: 6200

## 2021-04-11 DIAGNOSIS — U071 COVID-19: Secondary | ICD-10-CM | POA: Diagnosis not present

## 2021-05-02 DIAGNOSIS — U071 COVID-19: Secondary | ICD-10-CM | POA: Diagnosis not present

## 2021-05-12 DIAGNOSIS — U071 COVID-19: Secondary | ICD-10-CM | POA: Diagnosis not present

## 2021-05-23 DIAGNOSIS — S22078K Other fracture of T9-T10 vertebra, subsequent encounter for fracture with nonunion: Secondary | ICD-10-CM | POA: Diagnosis not present

## 2021-06-01 DIAGNOSIS — U071 COVID-19: Secondary | ICD-10-CM | POA: Diagnosis not present

## 2021-06-08 DIAGNOSIS — S22078K Other fracture of T9-T10 vertebra, subsequent encounter for fracture with nonunion: Secondary | ICD-10-CM | POA: Diagnosis not present

## 2021-06-11 DIAGNOSIS — U071 COVID-19: Secondary | ICD-10-CM | POA: Diagnosis not present

## 2021-07-02 DIAGNOSIS — U071 COVID-19: Secondary | ICD-10-CM | POA: Diagnosis not present

## 2021-07-06 DIAGNOSIS — S22078K Other fracture of T9-T10 vertebra, subsequent encounter for fracture with nonunion: Secondary | ICD-10-CM | POA: Diagnosis not present

## 2021-07-06 DIAGNOSIS — Z6831 Body mass index (BMI) 31.0-31.9, adult: Secondary | ICD-10-CM | POA: Diagnosis not present

## 2021-07-07 ENCOUNTER — Other Ambulatory Visit: Payer: Self-pay | Admitting: Neurological Surgery

## 2021-07-07 DIAGNOSIS — S22078K Other fracture of T9-T10 vertebra, subsequent encounter for fracture with nonunion: Secondary | ICD-10-CM

## 2021-07-12 DIAGNOSIS — U071 COVID-19: Secondary | ICD-10-CM | POA: Diagnosis not present

## 2021-07-14 ENCOUNTER — Ambulatory Visit
Admission: RE | Admit: 2021-07-14 | Discharge: 2021-07-14 | Disposition: A | Discharge status: Home / Self Care | Payer: BC Managed Care – PPO | Source: Ambulatory Visit | Attending: Neurological Surgery | Admitting: Neurological Surgery

## 2021-07-14 DIAGNOSIS — S22070A Wedge compression fracture of T9-T10 vertebra, initial encounter for closed fracture: Secondary | ICD-10-CM | POA: Diagnosis not present

## 2021-07-14 DIAGNOSIS — S22078K Other fracture of T9-T10 vertebra, subsequent encounter for fracture with nonunion: Secondary | ICD-10-CM

## 2021-07-19 DIAGNOSIS — S22078K Other fracture of T9-T10 vertebra, subsequent encounter for fracture with nonunion: Secondary | ICD-10-CM | POA: Diagnosis not present

## 2021-07-25 DIAGNOSIS — S22078K Other fracture of T9-T10 vertebra, subsequent encounter for fracture with nonunion: Secondary | ICD-10-CM | POA: Diagnosis not present

## 2021-07-25 DIAGNOSIS — Z981 Arthrodesis status: Secondary | ICD-10-CM | POA: Diagnosis not present

## 2021-08-01 DIAGNOSIS — U071 COVID-19: Secondary | ICD-10-CM | POA: Diagnosis not present

## 2021-08-10 DIAGNOSIS — S22078K Other fracture of T9-T10 vertebra, subsequent encounter for fracture with nonunion: Secondary | ICD-10-CM | POA: Diagnosis not present

## 2021-08-10 DIAGNOSIS — M1009 Idiopathic gout, multiple sites: Secondary | ICD-10-CM | POA: Diagnosis not present

## 2021-08-10 DIAGNOSIS — Z6832 Body mass index (BMI) 32.0-32.9, adult: Secondary | ICD-10-CM | POA: Diagnosis not present

## 2021-08-10 DIAGNOSIS — Z79899 Other long term (current) drug therapy: Secondary | ICD-10-CM | POA: Diagnosis not present

## 2021-08-10 DIAGNOSIS — K5190 Ulcerative colitis, unspecified, without complications: Secondary | ICD-10-CM | POA: Diagnosis not present

## 2021-08-10 DIAGNOSIS — M459 Ankylosing spondylitis of unspecified sites in spine: Secondary | ICD-10-CM | POA: Diagnosis not present

## 2021-08-11 DIAGNOSIS — U071 COVID-19: Secondary | ICD-10-CM | POA: Diagnosis not present

## 2021-09-01 DIAGNOSIS — U071 COVID-19: Secondary | ICD-10-CM | POA: Diagnosis not present

## 2021-09-11 DIAGNOSIS — U071 COVID-19: Secondary | ICD-10-CM | POA: Diagnosis not present

## 2021-09-12 DIAGNOSIS — Z6832 Body mass index (BMI) 32.0-32.9, adult: Secondary | ICD-10-CM | POA: Diagnosis not present

## 2021-09-12 DIAGNOSIS — S22078K Other fracture of T9-T10 vertebra, subsequent encounter for fracture with nonunion: Secondary | ICD-10-CM | POA: Diagnosis not present

## 2021-10-02 DIAGNOSIS — U071 COVID-19: Secondary | ICD-10-CM | POA: Diagnosis not present

## 2021-10-12 DIAGNOSIS — U071 COVID-19: Secondary | ICD-10-CM | POA: Diagnosis not present

## 2021-11-01 DIAGNOSIS — U071 COVID-19: Secondary | ICD-10-CM | POA: Diagnosis not present

## 2021-11-11 DIAGNOSIS — U071 COVID-19: Secondary | ICD-10-CM | POA: Diagnosis not present

## 2021-11-14 DIAGNOSIS — Z6833 Body mass index (BMI) 33.0-33.9, adult: Secondary | ICD-10-CM | POA: Diagnosis not present

## 2021-11-14 DIAGNOSIS — S22078K Other fracture of T9-T10 vertebra, subsequent encounter for fracture with nonunion: Secondary | ICD-10-CM | POA: Diagnosis not present

## 2021-11-28 LAB — HISTOPLASMA ANTIGEN, URINE: Histoplasma Antigen, urine: <0.5 (ref <0.5)

## 2021-12-02 DIAGNOSIS — U071 COVID-19: Secondary | ICD-10-CM | POA: Diagnosis not present

## 2021-12-12 DIAGNOSIS — U071 COVID-19: Secondary | ICD-10-CM | POA: Diagnosis not present

## 2021-12-13 ENCOUNTER — Telehealth: Payer: Self-pay

## 2021-12-13 NOTE — Patient Outreach (Signed)
  Care Coordination   12/13/2021 Name: Austin Chang MRN: [id number] DOB: [birth year]   Care Coordination Outreach Attempts:  An unsuccessful telephone outreach was attempted today to offer the patient information about available care coordination services as a benefit of their health plan.   Follow Up Plan:  Additional outreach attempts will be made to offer the patient care coordination information and services.   Encounter Outcome:  No Answer  Care Coordination Interventions Activated:  No   Care Coordination Interventions:  No, not indicated    Peter Garter RN, BSN,CCM, [place] Management [phone]

## 2022-01-01 ENCOUNTER — Telehealth: Payer: Self-pay

## 2022-01-01 ENCOUNTER — Other Ambulatory Visit: Payer: Self-pay | Admitting: Neurological Surgery

## 2022-01-01 DIAGNOSIS — U071 COVID-19: Secondary | ICD-10-CM | POA: Diagnosis not present

## 2022-01-01 DIAGNOSIS — S22078K Other fracture of T9-T10 vertebra, subsequent encounter for fracture with nonunion: Secondary | ICD-10-CM

## 2022-01-01 NOTE — Patient Outreach (Signed)
  Care Coordination   01/01/2022 Name: Austin Chang MRN: [id number] DOB: April 22, 1965   Care Coordination Outreach Attempts:  A second unsuccessful outreach was attempted today to offer the patient with information about available care coordination services as a benefit of their health plan.     Follow Up Plan:  Additional outreach attempts will be made to offer the patient care coordination information and services.   Encounter Outcome:  No Answer   Care Coordination Interventions:  No, not indicated    Peter Garter RN, BSN,CCM, CDE Care Management Coordinator [place] [facility] [phone]

## 2022-01-05 ENCOUNTER — Other Ambulatory Visit: Payer: BC Managed Care – PPO

## 2022-01-08 ENCOUNTER — Telehealth: Payer: Self-pay

## 2022-01-08 ENCOUNTER — Ambulatory Visit
Admission: RE | Admit: 2022-01-08 | Discharge: 2022-01-08 | Disposition: A | Discharge status: Home / Self Care | Payer: BC Managed Care – PPO | Source: Ambulatory Visit | Attending: Neurological Surgery | Admitting: Neurological Surgery

## 2022-01-08 DIAGNOSIS — M40204 Unspecified kyphosis, thoracic region: Secondary | ICD-10-CM | POA: Diagnosis not present

## 2022-01-08 DIAGNOSIS — S22078K Other fracture of T9-T10 vertebra, subsequent encounter for fracture with nonunion: Secondary | ICD-10-CM

## 2022-01-08 NOTE — Patient Outreach (Signed)
  Care Coordination   01/08/2022 Name: Austin Chang MRN: [id number] DOB: 06-17-1965   Care Coordination Outreach Attempts:  A third unsuccessful outreach was attempted today to offer the patient with information about available care coordination services as a benefit of their health plan.   Follow Up Plan:  No further outreach attempts will be made at this time. We have been unable to contact the patient to offer or enroll patient in care coordination services  Encounter Outcome:  No Answer   Care Coordination Interventions:  No, not indicated    Peter Garter RN, BSN,CCM, [place] Management [phone]

## 2022-01-11 DIAGNOSIS — U071 COVID-19: Secondary | ICD-10-CM | POA: Diagnosis not present

## 2022-01-11 DIAGNOSIS — Z6832 Body mass index (BMI) 32.0-32.9, adult: Secondary | ICD-10-CM | POA: Diagnosis not present

## 2022-01-11 DIAGNOSIS — S22078K Other fracture of T9-T10 vertebra, subsequent encounter for fracture with nonunion: Secondary | ICD-10-CM | POA: Diagnosis not present

## 2022-02-01 DIAGNOSIS — U071 COVID-19: Secondary | ICD-10-CM | POA: Diagnosis not present

## 2022-02-11 DIAGNOSIS — U071 COVID-19: Secondary | ICD-10-CM | POA: Diagnosis not present

## 2022-02-13 DIAGNOSIS — M459 Ankylosing spondylitis of unspecified sites in spine: Secondary | ICD-10-CM | POA: Diagnosis not present

## 2022-02-13 DIAGNOSIS — M1009 Idiopathic gout, multiple sites: Secondary | ICD-10-CM | POA: Diagnosis not present

## 2022-02-13 DIAGNOSIS — Z79899 Other long term (current) drug therapy: Secondary | ICD-10-CM | POA: Diagnosis not present

## 2022-02-13 DIAGNOSIS — R5383 Other fatigue: Secondary | ICD-10-CM | POA: Diagnosis not present

## 2022-02-13 DIAGNOSIS — K5190 Ulcerative colitis, unspecified, without complications: Secondary | ICD-10-CM | POA: Diagnosis not present

## 2022-03-04 DIAGNOSIS — U071 COVID-19: Secondary | ICD-10-CM | POA: Diagnosis not present

## 2022-03-07 DIAGNOSIS — M545 Low back pain, unspecified: Secondary | ICD-10-CM | POA: Diagnosis not present

## 2022-03-07 DIAGNOSIS — R109 Unspecified abdominal pain: Secondary | ICD-10-CM | POA: Diagnosis not present

## 2022-03-14 DIAGNOSIS — U071 COVID-19: Secondary | ICD-10-CM | POA: Diagnosis not present

## 2022-04-02 DIAGNOSIS — U071 COVID-19: Secondary | ICD-10-CM | POA: Diagnosis not present

## 2022-04-05 ENCOUNTER — Other Ambulatory Visit: Payer: Self-pay | Admitting: Physician Assistant

## 2022-04-05 ENCOUNTER — Ambulatory Visit
Admission: RE | Admit: 2022-04-05 | Discharge: 2022-04-05 | Disposition: A | Discharge status: Home / Self Care | Payer: BC Managed Care – PPO | Source: Ambulatory Visit | Attending: Physician Assistant | Admitting: Physician Assistant

## 2022-04-05 DIAGNOSIS — M546 Pain in thoracic spine: Secondary | ICD-10-CM

## 2022-04-05 DIAGNOSIS — Z9181 History of falling: Secondary | ICD-10-CM

## 2022-04-05 DIAGNOSIS — M549 Dorsalgia, unspecified: Secondary | ICD-10-CM | POA: Diagnosis not present

## 2022-04-12 DIAGNOSIS — U071 COVID-19: Secondary | ICD-10-CM | POA: Diagnosis not present

## 2022-05-03 DIAGNOSIS — U071 COVID-19: Secondary | ICD-10-CM | POA: Diagnosis not present

## 2022-05-10 DIAGNOSIS — Z9181 History of falling: Secondary | ICD-10-CM | POA: Diagnosis not present

## 2022-05-10 DIAGNOSIS — M450 Ankylosing spondylitis of multiple sites in spine: Secondary | ICD-10-CM | POA: Diagnosis not present

## 2022-05-10 DIAGNOSIS — S22078K Other fracture of T9-T10 vertebra, subsequent encounter for fracture with nonunion: Secondary | ICD-10-CM | POA: Diagnosis not present

## 2022-05-13 DIAGNOSIS — U071 COVID-19: Secondary | ICD-10-CM | POA: Diagnosis not present

## 2022-05-14 ENCOUNTER — Other Ambulatory Visit: Payer: Self-pay | Admitting: Neurological Surgery

## 2022-05-14 DIAGNOSIS — S22078K Other fracture of T9-T10 vertebra, subsequent encounter for fracture with nonunion: Secondary | ICD-10-CM

## 2022-05-14 DIAGNOSIS — Z9181 History of falling: Secondary | ICD-10-CM

## 2022-05-16 DIAGNOSIS — E785 Hyperlipidemia, unspecified: Secondary | ICD-10-CM | POA: Diagnosis not present

## 2022-05-16 DIAGNOSIS — D509 Iron deficiency anemia, unspecified: Secondary | ICD-10-CM | POA: Diagnosis not present

## 2022-05-16 DIAGNOSIS — Z79899 Other long term (current) drug therapy: Secondary | ICD-10-CM | POA: Diagnosis not present

## 2022-05-16 DIAGNOSIS — E1165 Type 2 diabetes mellitus with hyperglycemia: Secondary | ICD-10-CM | POA: Diagnosis not present

## 2022-05-16 DIAGNOSIS — E531 Pyridoxine deficiency: Secondary | ICD-10-CM | POA: Diagnosis not present

## 2022-05-16 DIAGNOSIS — K5100 Ulcerative (chronic) pancolitis without complications: Secondary | ICD-10-CM | POA: Diagnosis not present

## 2022-05-16 DIAGNOSIS — Z0001 Encounter for general adult medical examination with abnormal findings: Secondary | ICD-10-CM | POA: Diagnosis not present

## 2022-05-16 DIAGNOSIS — Z125 Encounter for screening for malignant neoplasm of prostate: Secondary | ICD-10-CM | POA: Diagnosis not present

## 2022-05-30 ENCOUNTER — Ambulatory Visit
Admission: RE | Admit: 2022-05-30 | Discharge: 2022-05-30 | Disposition: A | Discharge status: Home / Self Care | Payer: BC Managed Care – PPO | Source: Ambulatory Visit | Attending: Neurological Surgery | Admitting: Neurological Surgery

## 2022-05-30 DIAGNOSIS — M549 Dorsalgia, unspecified: Secondary | ICD-10-CM | POA: Diagnosis not present

## 2022-05-30 DIAGNOSIS — R296 Repeated falls: Secondary | ICD-10-CM | POA: Diagnosis not present

## 2022-05-30 DIAGNOSIS — S22078K Other fracture of T9-T10 vertebra, subsequent encounter for fracture with nonunion: Secondary | ICD-10-CM

## 2022-05-30 DIAGNOSIS — Z9181 History of falling: Secondary | ICD-10-CM

## 2022-05-30 DIAGNOSIS — M4324 Fusion of spine, thoracic region: Secondary | ICD-10-CM | POA: Diagnosis not present

## 2022-05-30 DIAGNOSIS — S22079A Unspecified fracture of T9-T10 vertebra, initial encounter for closed fracture: Secondary | ICD-10-CM | POA: Diagnosis not present

## 2022-06-02 DIAGNOSIS — U071 COVID-19: Secondary | ICD-10-CM | POA: Diagnosis not present

## 2022-06-05 DIAGNOSIS — S22089A Unspecified fracture of T11-T12 vertebra, initial encounter for closed fracture: Secondary | ICD-10-CM | POA: Diagnosis not present

## 2022-06-05 DIAGNOSIS — S32019A Unspecified fracture of first lumbar vertebra, initial encounter for closed fracture: Secondary | ICD-10-CM | POA: Diagnosis not present

## 2022-06-05 DIAGNOSIS — Z6832 Body mass index (BMI) 32.0-32.9, adult: Secondary | ICD-10-CM | POA: Diagnosis not present

## 2022-06-06 DIAGNOSIS — S22078K Other fracture of T9-T10 vertebra, subsequent encounter for fracture with nonunion: Secondary | ICD-10-CM | POA: Diagnosis not present

## 2022-06-06 DIAGNOSIS — W19XXXA Unspecified fall, initial encounter: Secondary | ICD-10-CM | POA: Diagnosis not present

## 2022-06-06 DIAGNOSIS — M450 Ankylosing spondylitis of multiple sites in spine: Secondary | ICD-10-CM | POA: Diagnosis not present

## 2022-06-06 DIAGNOSIS — Y929 Unspecified place or not applicable: Secondary | ICD-10-CM | POA: Diagnosis not present

## 2022-06-06 DIAGNOSIS — M453 Ankylosing spondylitis of cervicothoracic region: Secondary | ICD-10-CM | POA: Diagnosis not present

## 2022-06-06 DIAGNOSIS — S32018A Other fracture of first lumbar vertebra, initial encounter for closed fracture: Secondary | ICD-10-CM | POA: Diagnosis not present

## 2022-06-06 DIAGNOSIS — Z9181 History of falling: Secondary | ICD-10-CM | POA: Diagnosis not present

## 2022-06-06 DIAGNOSIS — S22088A Other fracture of T11-T12 vertebra, initial encounter for closed fracture: Secondary | ICD-10-CM | POA: Diagnosis not present

## 2022-06-06 DIAGNOSIS — Z981 Arthrodesis status: Secondary | ICD-10-CM | POA: Diagnosis not present

## 2022-06-12 DIAGNOSIS — U071 COVID-19: Secondary | ICD-10-CM | POA: Diagnosis not present

## 2022-07-03 DIAGNOSIS — U071 COVID-19: Secondary | ICD-10-CM | POA: Diagnosis not present

## 2022-07-13 DIAGNOSIS — U071 COVID-19: Secondary | ICD-10-CM | POA: Diagnosis not present

## 2022-07-24 DIAGNOSIS — S22078K Other fracture of T9-T10 vertebra, subsequent encounter for fracture with nonunion: Secondary | ICD-10-CM | POA: Diagnosis not present

## 2022-08-02 DIAGNOSIS — U071 COVID-19: Secondary | ICD-10-CM | POA: Diagnosis not present

## 2022-08-12 DIAGNOSIS — U071 COVID-19: Secondary | ICD-10-CM | POA: Diagnosis not present

## 2022-08-15 DIAGNOSIS — M1009 Idiopathic gout, multiple sites: Secondary | ICD-10-CM | POA: Diagnosis not present

## 2022-08-15 DIAGNOSIS — Z79899 Other long term (current) drug therapy: Secondary | ICD-10-CM | POA: Diagnosis not present

## 2022-08-15 DIAGNOSIS — K5190 Ulcerative colitis, unspecified, without complications: Secondary | ICD-10-CM | POA: Diagnosis not present

## 2022-08-15 DIAGNOSIS — M459 Ankylosing spondylitis of unspecified sites in spine: Secondary | ICD-10-CM | POA: Diagnosis not present

## 2022-08-30 DIAGNOSIS — Z86010 Personal history of colonic polyps: Secondary | ICD-10-CM | POA: Diagnosis not present

## 2022-08-30 DIAGNOSIS — Z01818 Encounter for other preprocedural examination: Secondary | ICD-10-CM | POA: Diagnosis not present

## 2022-09-02 DIAGNOSIS — U071 COVID-19: Secondary | ICD-10-CM | POA: Diagnosis not present

## 2022-09-12 DIAGNOSIS — U071 COVID-19: Secondary | ICD-10-CM | POA: Diagnosis not present

## 2022-09-25 DIAGNOSIS — Z6832 Body mass index (BMI) 32.0-32.9, adult: Secondary | ICD-10-CM | POA: Diagnosis not present

## 2022-09-25 DIAGNOSIS — S32018D Other fracture of first lumbar vertebra, subsequent encounter for fracture with routine healing: Secondary | ICD-10-CM | POA: Diagnosis not present

## 2022-09-26 DIAGNOSIS — K5100 Ulcerative (chronic) pancolitis without complications: Secondary | ICD-10-CM | POA: Diagnosis not present

## 2022-09-26 DIAGNOSIS — Z09 Encounter for follow-up examination after completed treatment for conditions other than malignant neoplasm: Secondary | ICD-10-CM | POA: Diagnosis not present

## 2022-09-26 DIAGNOSIS — Z86010 Personal history of colonic polyps: Secondary | ICD-10-CM | POA: Diagnosis not present

## 2022-10-03 DIAGNOSIS — U071 COVID-19: Secondary | ICD-10-CM | POA: Diagnosis not present

## 2022-10-13 DIAGNOSIS — U071 COVID-19: Secondary | ICD-10-CM | POA: Diagnosis not present

## 2022-11-01 DIAGNOSIS — K5190 Ulcerative colitis, unspecified, without complications: Secondary | ICD-10-CM | POA: Diagnosis not present

## 2022-11-02 DIAGNOSIS — U071 COVID-19: Secondary | ICD-10-CM | POA: Diagnosis not present

## 2022-11-12 DIAGNOSIS — U071 COVID-19: Secondary | ICD-10-CM | POA: Diagnosis not present

## 2022-11-13 ENCOUNTER — Other Ambulatory Visit (HOSPITAL_BASED_OUTPATIENT_CLINIC_OR_DEPARTMENT_OTHER): Payer: Self-pay | Admitting: Internal Medicine

## 2022-11-13 DIAGNOSIS — Z23 Encounter for immunization: Secondary | ICD-10-CM | POA: Diagnosis not present

## 2022-11-13 DIAGNOSIS — I700 Atherosclerosis of aorta: Secondary | ICD-10-CM | POA: Diagnosis not present

## 2022-11-13 DIAGNOSIS — E785 Hyperlipidemia, unspecified: Secondary | ICD-10-CM | POA: Diagnosis not present

## 2022-11-13 DIAGNOSIS — I10 Essential (primary) hypertension: Secondary | ICD-10-CM | POA: Diagnosis not present

## 2022-11-13 DIAGNOSIS — E1165 Type 2 diabetes mellitus with hyperglycemia: Secondary | ICD-10-CM | POA: Diagnosis not present

## 2022-11-28 DIAGNOSIS — E1165 Type 2 diabetes mellitus with hyperglycemia: Secondary | ICD-10-CM | POA: Diagnosis not present

## 2022-11-28 DIAGNOSIS — I10 Essential (primary) hypertension: Secondary | ICD-10-CM | POA: Diagnosis not present

## 2022-12-03 DIAGNOSIS — U071 COVID-19: Secondary | ICD-10-CM | POA: Diagnosis not present

## 2022-12-13 DIAGNOSIS — U071 COVID-19: Secondary | ICD-10-CM | POA: Diagnosis not present

## 2022-12-14 ENCOUNTER — Encounter (HOSPITAL_BASED_OUTPATIENT_CLINIC_OR_DEPARTMENT_OTHER): Payer: Self-pay

## 2022-12-14 ENCOUNTER — Inpatient Hospital Stay (HOSPITAL_BASED_OUTPATIENT_CLINIC_OR_DEPARTMENT_OTHER): Admission: RE | Admit: 2022-12-14 | Payer: BC Managed Care – PPO | Source: Ambulatory Visit

## 2023-01-02 DIAGNOSIS — U071 COVID-19: Secondary | ICD-10-CM | POA: Diagnosis not present

## 2023-01-09 ENCOUNTER — Ambulatory Visit (HOSPITAL_COMMUNITY)
Admission: RE | Admit: 2023-01-09 | Discharge: 2023-01-09 | Disposition: A | Discharge status: Home / Self Care | Payer: BC Managed Care – PPO | Source: Ambulatory Visit | Attending: Internal Medicine | Admitting: Internal Medicine

## 2023-01-09 DIAGNOSIS — E785 Hyperlipidemia, unspecified: Secondary | ICD-10-CM | POA: Insufficient documentation

## 2023-01-12 DIAGNOSIS — U071 COVID-19: Secondary | ICD-10-CM | POA: Diagnosis not present

## 2023-02-02 DIAGNOSIS — U071 COVID-19: Secondary | ICD-10-CM | POA: Diagnosis not present

## 2023-02-14 DIAGNOSIS — R5383 Other fatigue: Secondary | ICD-10-CM | POA: Diagnosis not present

## 2023-02-14 DIAGNOSIS — M459 Ankylosing spondylitis of unspecified sites in spine: Secondary | ICD-10-CM | POA: Diagnosis not present

## 2023-02-14 DIAGNOSIS — M1991 Primary osteoarthritis, unspecified site: Secondary | ICD-10-CM | POA: Diagnosis not present

## 2023-02-14 DIAGNOSIS — K5190 Ulcerative colitis, unspecified, without complications: Secondary | ICD-10-CM | POA: Diagnosis not present

## 2023-02-14 DIAGNOSIS — M1009 Idiopathic gout, multiple sites: Secondary | ICD-10-CM | POA: Diagnosis not present

## 2023-02-28 DIAGNOSIS — Z79899 Other long term (current) drug therapy: Secondary | ICD-10-CM | POA: Diagnosis not present

## 2023-03-05 DIAGNOSIS — U071 COVID-19: Secondary | ICD-10-CM | POA: Diagnosis not present

## 2023-05-21 DIAGNOSIS — E785 Hyperlipidemia, unspecified: Secondary | ICD-10-CM | POA: Diagnosis not present

## 2023-05-21 DIAGNOSIS — Z125 Encounter for screening for malignant neoplasm of prostate: Secondary | ICD-10-CM | POA: Diagnosis not present

## 2023-05-21 DIAGNOSIS — Z79899 Other long term (current) drug therapy: Secondary | ICD-10-CM | POA: Diagnosis not present

## 2023-05-21 DIAGNOSIS — I10 Essential (primary) hypertension: Secondary | ICD-10-CM | POA: Diagnosis not present

## 2023-05-21 DIAGNOSIS — E1165 Type 2 diabetes mellitus with hyperglycemia: Secondary | ICD-10-CM | POA: Diagnosis not present

## 2023-05-21 DIAGNOSIS — E559 Vitamin D deficiency, unspecified: Secondary | ICD-10-CM | POA: Diagnosis not present

## 2023-05-21 DIAGNOSIS — Z0000 Encounter for general adult medical examination without abnormal findings: Secondary | ICD-10-CM | POA: Diagnosis not present

## 2023-08-30 DIAGNOSIS — M1009 Idiopathic gout, multiple sites: Secondary | ICD-10-CM | POA: Diagnosis not present

## 2023-08-30 DIAGNOSIS — M1991 Primary osteoarthritis, unspecified site: Secondary | ICD-10-CM | POA: Diagnosis not present

## 2023-08-30 DIAGNOSIS — K5190 Ulcerative colitis, unspecified, without complications: Secondary | ICD-10-CM | POA: Diagnosis not present

## 2023-08-30 DIAGNOSIS — M459 Ankylosing spondylitis of unspecified sites in spine: Secondary | ICD-10-CM | POA: Diagnosis not present

## 2023-10-02 DIAGNOSIS — R051 Acute cough: Secondary | ICD-10-CM | POA: Diagnosis not present

## 2023-10-02 DIAGNOSIS — J029 Acute pharyngitis, unspecified: Secondary | ICD-10-CM | POA: Diagnosis not present

## 2023-10-02 DIAGNOSIS — Z03818 Encounter for observation for suspected exposure to other biological agents ruled out: Secondary | ICD-10-CM | POA: Diagnosis not present

## 2023-10-02 DIAGNOSIS — I10 Essential (primary) hypertension: Secondary | ICD-10-CM | POA: Diagnosis not present

## 2023-10-02 DIAGNOSIS — B349 Viral infection, unspecified: Secondary | ICD-10-CM | POA: Diagnosis not present
